# Patient Record
Sex: Female | Born: 1955 | Race: White | Hispanic: No | Smoking: Former smoker
Health system: Southern US, Community
[De-identification: ages and names within clinical notes are randomized; demographics above are authoritative.]

## PROBLEM LIST (undated history)

## (undated) DIAGNOSIS — I639 Cerebral infarction, unspecified: Secondary | ICD-10-CM

## (undated) DIAGNOSIS — I509 Heart failure, unspecified: Secondary | ICD-10-CM

## (undated) DIAGNOSIS — B1920 Unspecified viral hepatitis C without hepatic coma: Secondary | ICD-10-CM

## (undated) DIAGNOSIS — I701 Atherosclerosis of renal artery: Secondary | ICD-10-CM

## (undated) DIAGNOSIS — I4891 Unspecified atrial fibrillation: Secondary | ICD-10-CM

## (undated) DIAGNOSIS — Z720 Tobacco use: Secondary | ICD-10-CM

## (undated) DIAGNOSIS — J449 Chronic obstructive pulmonary disease, unspecified: Secondary | ICD-10-CM

## (undated) DIAGNOSIS — I159 Secondary hypertension, unspecified: Secondary | ICD-10-CM

## (undated) DIAGNOSIS — I2699 Other pulmonary embolism without acute cor pulmonale: Secondary | ICD-10-CM

## (undated) HISTORY — PX: OTHER SURGICAL HISTORY: SHX169

## (undated) HISTORY — PX: BACK SURGERY: SHX140

## (undated) HISTORY — PX: PLACEMENT OF BREAST IMPLANTS: SHX6334

---

## 2001-05-27 ENCOUNTER — Other Ambulatory Visit: Admission: RE | Admit: 2001-05-27 | Discharge: 2001-05-27 | Payer: Self-pay | Admitting: Internal Medicine

## 2002-04-28 ENCOUNTER — Encounter: Payer: Self-pay | Admitting: Emergency Medicine

## 2002-04-28 ENCOUNTER — Emergency Department (HOSPITAL_COMMUNITY): Admission: EM | Admit: 2002-04-28 | Discharge: 2002-04-28 | Payer: Self-pay | Admitting: Emergency Medicine

## 2002-05-01 ENCOUNTER — Ambulatory Visit (HOSPITAL_COMMUNITY): Admission: RE | Admit: 2002-05-01 | Discharge: 2002-05-02 | Payer: Self-pay | Admitting: Neurological Surgery

## 2002-05-01 ENCOUNTER — Encounter: Payer: Self-pay | Admitting: Neurological Surgery

## 2010-07-17 ENCOUNTER — Encounter: Payer: Self-pay | Admitting: Family Medicine

## 2014-05-19 ENCOUNTER — Other Ambulatory Visit: Payer: Self-pay | Admitting: Family Medicine

## 2014-05-19 DIAGNOSIS — I701 Atherosclerosis of renal artery: Secondary | ICD-10-CM

## 2014-05-29 ENCOUNTER — Other Ambulatory Visit: Payer: Self-pay

## 2014-06-03 ENCOUNTER — Inpatient Hospital Stay: Admission: RE | Admit: 2014-06-03 | Payer: Self-pay | Source: Ambulatory Visit

## 2014-06-03 ENCOUNTER — Other Ambulatory Visit: Payer: Self-pay | Admitting: Family Medicine

## 2014-06-03 DIAGNOSIS — I701 Atherosclerosis of renal artery: Secondary | ICD-10-CM

## 2016-05-22 ENCOUNTER — Encounter (HOSPITAL_COMMUNITY): Payer: Self-pay | Admitting: *Deleted

## 2016-05-22 ENCOUNTER — Inpatient Hospital Stay (HOSPITAL_COMMUNITY)
Admission: EM | Admit: 2016-05-22 | Discharge: 2016-05-26 | DRG: 871 | Disposition: A | Discharge status: Home / Self Care | Payer: PRIVATE HEALTH INSURANCE | Attending: Internal Medicine | Admitting: Internal Medicine

## 2016-05-22 ENCOUNTER — Inpatient Hospital Stay (HOSPITAL_COMMUNITY): Payer: PRIVATE HEALTH INSURANCE

## 2016-05-22 ENCOUNTER — Emergency Department (HOSPITAL_COMMUNITY): Payer: PRIVATE HEALTH INSURANCE

## 2016-05-22 DIAGNOSIS — F419 Anxiety disorder, unspecified: Secondary | ICD-10-CM | POA: Diagnosis present

## 2016-05-22 DIAGNOSIS — F17200 Nicotine dependence, unspecified, uncomplicated: Secondary | ICD-10-CM | POA: Diagnosis present

## 2016-05-22 DIAGNOSIS — T380X5A Adverse effect of glucocorticoids and synthetic analogues, initial encounter: Secondary | ICD-10-CM | POA: Diagnosis not present

## 2016-05-22 DIAGNOSIS — J189 Pneumonia, unspecified organism: Secondary | ICD-10-CM

## 2016-05-22 DIAGNOSIS — I509 Heart failure, unspecified: Secondary | ICD-10-CM | POA: Diagnosis not present

## 2016-05-22 DIAGNOSIS — Z885 Allergy status to narcotic agent status: Secondary | ICD-10-CM

## 2016-05-22 DIAGNOSIS — J810 Acute pulmonary edema: Secondary | ICD-10-CM | POA: Diagnosis not present

## 2016-05-22 DIAGNOSIS — I2510 Atherosclerotic heart disease of native coronary artery without angina pectoris: Secondary | ICD-10-CM | POA: Diagnosis not present

## 2016-05-22 DIAGNOSIS — J440 Chronic obstructive pulmonary disease with acute lower respiratory infection: Secondary | ICD-10-CM | POA: Diagnosis present

## 2016-05-22 DIAGNOSIS — R350 Frequency of micturition: Secondary | ICD-10-CM | POA: Diagnosis present

## 2016-05-22 DIAGNOSIS — R748 Abnormal levels of other serum enzymes: Secondary | ICD-10-CM | POA: Diagnosis not present

## 2016-05-22 DIAGNOSIS — N179 Acute kidney failure, unspecified: Secondary | ICD-10-CM

## 2016-05-22 DIAGNOSIS — R931 Abnormal findings on diagnostic imaging of heart and coronary circulation: Secondary | ICD-10-CM

## 2016-05-22 DIAGNOSIS — J9600 Acute respiratory failure, unspecified whether with hypoxia or hypercapnia: Secondary | ICD-10-CM | POA: Diagnosis present

## 2016-05-22 DIAGNOSIS — J9601 Acute respiratory failure with hypoxia: Secondary | ICD-10-CM | POA: Diagnosis not present

## 2016-05-22 DIAGNOSIS — Z888 Allergy status to other drugs, medicaments and biological substances status: Secondary | ICD-10-CM

## 2016-05-22 DIAGNOSIS — I159 Secondary hypertension, unspecified: Secondary | ICD-10-CM | POA: Diagnosis present

## 2016-05-22 DIAGNOSIS — R6520 Severe sepsis without septic shock: Secondary | ICD-10-CM | POA: Diagnosis present

## 2016-05-22 DIAGNOSIS — A419 Sepsis, unspecified organism: Principal | ICD-10-CM

## 2016-05-22 DIAGNOSIS — R410 Disorientation, unspecified: Secondary | ICD-10-CM | POA: Diagnosis present

## 2016-05-22 DIAGNOSIS — R739 Hyperglycemia, unspecified: Secondary | ICD-10-CM | POA: Diagnosis present

## 2016-05-22 DIAGNOSIS — Z23 Encounter for immunization: Secondary | ICD-10-CM | POA: Diagnosis not present

## 2016-05-22 DIAGNOSIS — R7989 Other specified abnormal findings of blood chemistry: Secondary | ICD-10-CM | POA: Diagnosis present

## 2016-05-22 DIAGNOSIS — J449 Chronic obstructive pulmonary disease, unspecified: Secondary | ICD-10-CM

## 2016-05-22 DIAGNOSIS — I701 Atherosclerosis of renal artery: Secondary | ICD-10-CM | POA: Diagnosis present

## 2016-05-22 DIAGNOSIS — R0902 Hypoxemia: Secondary | ICD-10-CM

## 2016-05-22 DIAGNOSIS — R32 Unspecified urinary incontinence: Secondary | ICD-10-CM | POA: Diagnosis present

## 2016-05-22 DIAGNOSIS — R9431 Abnormal electrocardiogram [ECG] [EKG]: Secondary | ICD-10-CM | POA: Diagnosis present

## 2016-05-22 DIAGNOSIS — Z791 Long term (current) use of non-steroidal anti-inflammatories (NSAID): Secondary | ICD-10-CM

## 2016-05-22 DIAGNOSIS — J9690 Respiratory failure, unspecified, unspecified whether with hypoxia or hypercapnia: Secondary | ICD-10-CM

## 2016-05-22 DIAGNOSIS — E873 Alkalosis: Secondary | ICD-10-CM | POA: Diagnosis not present

## 2016-05-22 DIAGNOSIS — J441 Chronic obstructive pulmonary disease with (acute) exacerbation: Secondary | ICD-10-CM | POA: Diagnosis present

## 2016-05-22 DIAGNOSIS — I248 Other forms of acute ischemic heart disease: Secondary | ICD-10-CM | POA: Diagnosis present

## 2016-05-22 DIAGNOSIS — R778 Other specified abnormalities of plasma proteins: Secondary | ICD-10-CM

## 2016-05-22 DIAGNOSIS — Z91013 Allergy to seafood: Secondary | ICD-10-CM

## 2016-05-22 HISTORY — DX: Tobacco use: Z72.0

## 2016-05-22 HISTORY — DX: Atherosclerosis of renal artery: I70.1

## 2016-05-22 HISTORY — DX: Secondary hypertension, unspecified: I15.9

## 2016-05-22 LAB — CBC WITH DIFFERENTIAL/PLATELET
BASOS ABS: 0 10*3/uL (ref 0.0–0.1)
BASOS PCT: 0 %
Basophils Absolute: 0 10*3/uL (ref 0.0–0.1)
Basophils Relative: 0 %
EOS PCT: 2 %
Eosinophils Absolute: 0.3 10*3/uL (ref 0.0–0.7)
Eosinophils Absolute: 0 10*3/uL (ref 0.0–0.7)
Eosinophils Relative: 0 %
HCT: 50.7 % — ABNORMAL HIGH (ref 36.0–46.0)
HEMATOCRIT: 42 % (ref 36.0–46.0)
HEMOGLOBIN: 14.2 g/dL (ref 12.0–15.0)
Hemoglobin: 17.3 g/dL — ABNORMAL HIGH (ref 12.0–15.0)
LYMPHS ABS: 7.1 10*3/uL — AB (ref 0.7–4.0)
LYMPHS PCT: 9 %
Lymphocytes Relative: 43 %
Lymphs Abs: 0.8 10*3/uL (ref 0.7–4.0)
MCH: 30.7 pg (ref 26.0–34.0)
MCH: 29.8 pg (ref 26.0–34.0)
MCHC: 34.1 g/dL (ref 30.0–36.0)
MCHC: 33.8 g/dL (ref 30.0–36.0)
MCV: 89.9 fL (ref 78.0–100.0)
MCV: 88.1 fL (ref 78.0–100.0)
MONO ABS: 1 10*3/uL (ref 0.1–1.0)
MONO ABS: 0.1 10*3/uL (ref 0.1–1.0)
Monocytes Relative: 6 %
Monocytes Relative: 1 %
NEUTROS ABS: 8.2 10*3/uL — AB (ref 1.7–7.7)
NEUTROS ABS: 8.3 10*3/uL — AB (ref 1.7–7.7)
NEUTROS PCT: 90 %
Neutrophils Relative %: 49 %
PLATELETS: 282 10*3/uL (ref 150–400)
Platelets: 232 10*3/uL (ref 150–400)
RBC: 5.64 MIL/uL — AB (ref 3.87–5.11)
RBC: 4.77 MIL/uL (ref 3.87–5.11)
RDW: 12.9 % (ref 11.5–15.5)
RDW: 12.9 % (ref 11.5–15.5)
WBC: 16.6 10*3/uL — AB (ref 4.0–10.5)
WBC: 9.2 10*3/uL (ref 4.0–10.5)

## 2016-05-22 LAB — PROTIME-INR
INR: 1.06
Prothrombin Time: 13.8 seconds (ref 11.4–15.2)

## 2016-05-22 LAB — LACTIC ACID, PLASMA
LACTIC ACID, VENOUS: 3.7 mmol/L — AB (ref 0.5–1.9)
LACTIC ACID, VENOUS: 5.3 mmol/L — AB (ref 0.5–1.9)
LACTIC ACID, VENOUS: 5 mmol/L — AB (ref 0.5–1.9)
LACTIC ACID, VENOUS: 2.6 mmol/L — AB (ref 0.5–1.9)
LACTIC ACID, VENOUS: 1.3 mmol/L (ref 0.5–1.9)
Lactic Acid, Venous: 2.5 mmol/L (ref 0.5–1.9)

## 2016-05-22 LAB — I-STAT TROPONIN, ED
Troponin i, poc: 0.07 ng/mL (ref 0.00–0.08)
Troponin i, poc: 0.24 ng/mL (ref 0.00–0.08)

## 2016-05-22 LAB — I-STAT ARTERIAL BLOOD GAS, ED
Acid-base deficit: 8 mmol/L — ABNORMAL HIGH (ref 0.0–2.0)
Acid-base deficit: 8 mmol/L — ABNORMAL HIGH (ref 0.0–2.0)
BICARBONATE: 18.3 mmol/L — AB (ref 20.0–28.0)
Bicarbonate: 14.9 mmol/L — ABNORMAL LOW (ref 20.0–28.0)
O2 SAT: 95 %
O2 Saturation: 92 %
PCO2 ART: 38.4 mmHg (ref 32.0–48.0)
PO2 ART: 86 mmHg (ref 83.0–108.0)
Patient temperature: 98.6
TCO2: 19 mmol/L (ref 0–100)
TCO2: 16 mmol/L (ref 0–100)
pCO2 arterial: 24.3 mmHg — ABNORMAL LOW (ref 32.0–48.0)
pH, Arterial: 7.285 — ABNORMAL LOW (ref 7.350–7.450)
pH, Arterial: 7.395 (ref 7.350–7.450)
pO2, Arterial: 61 mmHg — ABNORMAL LOW (ref 83.0–108.0)

## 2016-05-22 LAB — PROCALCITONIN
Procalcitonin: 0.18 ng/mL
Procalcitonin: 0.36 ng/mL
Procalcitonin: 0.37 ng/mL

## 2016-05-22 LAB — COMPREHENSIVE METABOLIC PANEL
ALBUMIN: 3.3 g/dL — AB (ref 3.5–5.0)
ALK PHOS: 82 U/L (ref 38–126)
ALT: 20 U/L (ref 14–54)
AST: 36 U/L (ref 15–41)
Anion gap: 12 (ref 5–15)
BILIRUBIN TOTAL: 0.6 mg/dL (ref 0.3–1.2)
BUN: 14 mg/dL (ref 6–20)
CALCIUM: 8.5 mg/dL — AB (ref 8.9–10.3)
CO2: 18 mmol/L — ABNORMAL LOW (ref 22–32)
CREATININE: 1.24 mg/dL — AB (ref 0.44–1.00)
Chloride: 107 mmol/L (ref 101–111)
GFR calc Af Amer: 53 mL/min — ABNORMAL LOW (ref >60)
GFR, EST NON AFRICAN AMERICAN: 46 mL/min — AB (ref >60)
GLUCOSE: 184 mg/dL — AB (ref 65–99)
Potassium: 3 mmol/L — ABNORMAL LOW (ref 3.5–5.1)
Sodium: 137 mmol/L (ref 135–145)
TOTAL PROTEIN: 6.8 g/dL (ref 6.5–8.1)

## 2016-05-22 LAB — I-STAT CG4 LACTIC ACID, ED
LACTIC ACID, VENOUS: 3.47 mmol/L — AB (ref 0.5–1.9)
Lactic Acid, Venous: 2.52 mmol/L (ref 0.5–1.9)

## 2016-05-22 LAB — GLUCOSE, CAPILLARY
GLUCOSE-CAPILLARY: 125 mg/dL — AB (ref 65–99)
Glucose-Capillary: 125 mg/dL — ABNORMAL HIGH (ref 65–99)

## 2016-05-22 LAB — BASIC METABOLIC PANEL
ANION GAP: 12 (ref 5–15)
BUN: 15 mg/dL (ref 6–20)
CALCIUM: 9.3 mg/dL (ref 8.9–10.3)
CO2: 16 mmol/L — AB (ref 22–32)
CREATININE: 1.37 mg/dL — AB (ref 0.44–1.00)
Chloride: 108 mmol/L (ref 101–111)
GFR calc Af Amer: 47 mL/min — ABNORMAL LOW (ref >60)
GFR, EST NON AFRICAN AMERICAN: 41 mL/min — AB (ref >60)
GLUCOSE: 236 mg/dL — AB (ref 65–99)
Potassium: 3.8 mmol/L (ref 3.5–5.1)
Sodium: 136 mmol/L (ref 135–145)

## 2016-05-22 LAB — TROPONIN I
TROPONIN I: 0.33 ng/mL — AB (ref <0.03)
Troponin I: 0.16 ng/mL (ref <0.03)
Troponin I: 0.18 ng/mL (ref <0.03)
Troponin I: 0.22 ng/mL (ref <0.03)

## 2016-05-22 LAB — MRSA PCR SCREENING: MRSA by PCR: NEGATIVE

## 2016-05-22 LAB — ACETAMINOPHEN LEVEL: Acetaminophen (Tylenol), Serum: <10 ug/mL — ABNORMAL LOW (ref 10–30)

## 2016-05-22 LAB — SALICYLATE LEVEL

## 2016-05-22 LAB — SEDIMENTATION RATE: SED RATE: 2 mm/h (ref 0–22)

## 2016-05-22 LAB — INFLUENZA PANEL BY PCR (TYPE A & B)
INFLAPCR: NEGATIVE
INFLBPCR: NEGATIVE

## 2016-05-22 LAB — BRAIN NATRIURETIC PEPTIDE: B Natriuretic Peptide: 633.4 pg/mL — ABNORMAL HIGH (ref 0.0–100.0)

## 2016-05-22 LAB — TSH: TSH: 1.694 u[IU]/mL (ref 0.350–4.500)

## 2016-05-22 LAB — APTT: aPTT: 27 seconds (ref 24–36)

## 2016-05-22 MED ORDER — SODIUM CHLORIDE 0.9 % IV BOLUS (SEPSIS)
1000.0000 mL | Freq: Once | INTRAVENOUS | Status: AC
  Administered 2016-05-22: 1000 mL via INTRAVENOUS

## 2016-05-22 MED ORDER — IPRATROPIUM-ALBUTEROL 0.5-2.5 (3) MG/3ML IN SOLN
RESPIRATORY_TRACT | Status: AC
  Administered 2016-05-22: 17:00:00
  Filled 2016-05-22: qty 3

## 2016-05-22 MED ORDER — SODIUM CHLORIDE 0.9 % IV SOLN
Freq: Once | INTRAVENOUS | Status: AC
  Administered 2016-05-22: 11:00:00 via INTRAVENOUS

## 2016-05-22 MED ORDER — SODIUM CHLORIDE 0.9 % IV SOLN
INTRAVENOUS | Status: DC
  Administered 2016-05-22 – 2016-05-23 (×2): via INTRAVENOUS

## 2016-05-22 MED ORDER — LEVOFLOXACIN IN D5W 750 MG/150ML IV SOLN
750.0000 mg | INTRAVENOUS | Status: DC
  Administered 2016-05-22: 750 mg via INTRAVENOUS
  Filled 2016-05-22: qty 150

## 2016-05-22 MED ORDER — METHYLPREDNISOLONE SODIUM SUCC 125 MG IJ SOLR
60.0000 mg | Freq: Four times a day (QID) | INTRAMUSCULAR | Status: DC
  Administered 2016-05-22 – 2016-05-23 (×4): 60 mg via INTRAVENOUS
  Filled 2016-05-22 (×5): qty 2

## 2016-05-22 MED ORDER — SODIUM CHLORIDE 0.9% FLUSH
3.0000 mL | Freq: Two times a day (BID) | INTRAVENOUS | Status: DC
  Administered 2016-05-22 – 2016-05-25 (×6): 3 mL via INTRAVENOUS

## 2016-05-22 MED ORDER — DEXTROSE 5 % IV SOLN
1.0000 g | INTRAVENOUS | Status: DC
  Administered 2016-05-22 – 2016-05-25 (×4): 1 g via INTRAVENOUS
  Filled 2016-05-22 (×5): qty 10

## 2016-05-22 MED ORDER — AZITHROMYCIN 500 MG IV SOLR
500.0000 mg | INTRAVENOUS | Status: DC
  Administered 2016-05-23 – 2016-05-25 (×3): 500 mg via INTRAVENOUS
  Filled 2016-05-22 (×4): qty 500

## 2016-05-22 MED ORDER — ASPIRIN 325 MG PO TABS
325.0000 mg | ORAL_TABLET | Freq: Once | ORAL | Status: DC
  Filled 2016-05-22: qty 1

## 2016-05-22 MED ORDER — OSELTAMIVIR PHOSPHATE 30 MG PO CAPS
30.0000 mg | ORAL_CAPSULE | Freq: Two times a day (BID) | ORAL | Status: DC
  Administered 2016-05-23: 30 mg via ORAL
  Filled 2016-05-22 (×2): qty 1

## 2016-05-22 MED ORDER — ACETAMINOPHEN 325 MG PO TABS
650.0000 mg | ORAL_TABLET | ORAL | Status: DC | PRN
  Administered 2016-05-22: 650 mg via ORAL
  Filled 2016-05-22: qty 2

## 2016-05-22 MED ORDER — GUAIFENESIN ER 600 MG PO TB12
1200.0000 mg | ORAL_TABLET | Freq: Two times a day (BID) | ORAL | Status: DC
  Administered 2016-05-23 – 2016-05-26 (×7): 1200 mg via ORAL
  Filled 2016-05-22 (×9): qty 2

## 2016-05-22 MED ORDER — ASPIRIN 325 MG PO TABS
325.0000 mg | ORAL_TABLET | Freq: Every day | ORAL | Status: DC
  Administered 2016-05-23 – 2016-05-26 (×4): 325 mg via ORAL
  Filled 2016-05-22 (×4): qty 1

## 2016-05-22 MED ORDER — SODIUM CHLORIDE 0.9 % IV SOLN: 250.0000 mL | INTRAVENOUS | Status: DC | PRN

## 2016-05-22 MED ORDER — FUROSEMIDE 10 MG/ML IJ SOLN
40.0000 mg | Freq: Once | INTRAMUSCULAR | Status: DC
  Filled 2016-05-22: qty 4

## 2016-05-22 MED ORDER — SODIUM CHLORIDE 0.9% FLUSH
3.0000 mL | Freq: Two times a day (BID) | INTRAVENOUS | Status: DC
  Administered 2016-05-22 – 2016-05-26 (×5): 3 mL via INTRAVENOUS

## 2016-05-22 MED ORDER — ACETAMINOPHEN 650 MG RE SUPP: 650.0000 mg | Freq: Four times a day (QID) | RECTAL | Status: DC | PRN

## 2016-05-22 MED ORDER — ENOXAPARIN SODIUM 40 MG/0.4ML ~~LOC~~ SOLN
40.0000 mg | Freq: Every day | SUBCUTANEOUS | Status: DC
  Administered 2016-05-23 – 2016-05-24 (×2): 40 mg via SUBCUTANEOUS
  Filled 2016-05-22 (×4): qty 0.4

## 2016-05-22 MED ORDER — ACETAMINOPHEN 325 MG PO TABS
650.0000 mg | ORAL_TABLET | Freq: Four times a day (QID) | ORAL | Status: DC | PRN
  Filled 2016-05-22: qty 2

## 2016-05-22 MED ORDER — LORAZEPAM 2 MG/ML IJ SOLN
0.5000 mg | INTRAMUSCULAR | Status: AC
  Administered 2016-05-22: 0.5 mg via INTRAVENOUS
  Filled 2016-05-22: qty 1

## 2016-05-22 MED ORDER — INSULIN ASPART 100 UNIT/ML ~~LOC~~ SOLN
0.0000 [IU] | SUBCUTANEOUS | Status: DC
Start: 2016-05-22 — End: 2016-05-23
  Administered 2016-05-22 – 2016-05-23 (×4): 1 [IU] via SUBCUTANEOUS
  Administered 2016-05-23: 3 [IU] via SUBCUTANEOUS

## 2016-05-22 MED ORDER — ONDANSETRON HCL 4 MG/2ML IJ SOLN: 4.0000 mg | Freq: Four times a day (QID) | INTRAMUSCULAR | Status: DC | PRN

## 2016-05-22 MED ORDER — KETOROLAC TROMETHAMINE 30 MG/ML IJ SOLN
30.0000 mg | Freq: Four times a day (QID) | INTRAMUSCULAR | Status: DC | PRN
Start: 2016-05-22 — End: 2016-05-24

## 2016-05-22 MED ORDER — ALBUTEROL (5 MG/ML) CONTINUOUS INHALATION SOLN
10.0000 mg/h | INHALATION_SOLUTION | Freq: Once | RESPIRATORY_TRACT | Status: AC
  Administered 2016-05-22: 10 mg/h via RESPIRATORY_TRACT
  Filled 2016-05-22: qty 20

## 2016-05-22 MED ORDER — HEPARIN SODIUM (PORCINE) 5000 UNIT/ML IJ SOLN: 5000.0000 [IU] | Freq: Three times a day (TID) | INTRAMUSCULAR | Status: DC

## 2016-05-22 MED ORDER — ONDANSETRON HCL 4 MG PO TABS: 4.0000 mg | ORAL_TABLET | Freq: Four times a day (QID) | ORAL | Status: DC | PRN

## 2016-05-22 MED ORDER — LEVALBUTEROL HCL 1.25 MG/0.5ML IN NEBU
1.2500 mg | INHALATION_SOLUTION | Freq: Four times a day (QID) | RESPIRATORY_TRACT | Status: DC
  Filled 2016-05-22: qty 0.5

## 2016-05-22 MED ORDER — IBUPROFEN 600 MG PO TABS
600.0000 mg | ORAL_TABLET | ORAL | Status: DC | PRN
  Administered 2016-05-22 – 2016-05-23 (×3): 600 mg via ORAL
  Filled 2016-05-22 (×3): qty 3

## 2016-05-22 MED ORDER — LEVALBUTEROL HCL 0.63 MG/3ML IN NEBU: 0.6300 mg | INHALATION_SOLUTION | RESPIRATORY_TRACT | Status: DC | PRN

## 2016-05-22 MED ORDER — PREDNISONE 20 MG PO TABS: 60.0000 mg | ORAL_TABLET | Freq: Every day | ORAL | Status: DC

## 2016-05-22 MED ORDER — VANCOMYCIN HCL 500 MG IV SOLR
500.0000 mg | Freq: Two times a day (BID) | INTRAVENOUS | Status: DC
  Administered 2016-05-22: 500 mg via INTRAVENOUS
  Filled 2016-05-22 (×3): qty 500

## 2016-05-22 MED ORDER — PIPERACILLIN-TAZOBACTAM 3.375 G IVPB
3.3750 g | Freq: Three times a day (TID) | INTRAVENOUS | Status: DC
Start: 2016-05-22 — End: 2016-05-22
  Administered 2016-05-22: 3.375 g via INTRAVENOUS
  Filled 2016-05-22: qty 50

## 2016-05-22 MED ORDER — IBUPROFEN 800 MG PO TABS
800.0000 mg | ORAL_TABLET | Freq: Once | ORAL | Status: AC
  Administered 2016-05-22: 800 mg via ORAL
  Filled 2016-05-22: qty 1

## 2016-05-22 MED ORDER — METOPROLOL TARTRATE 5 MG/5ML IV SOLN
5.0000 mg | INTRAVENOUS | Status: DC | PRN
  Filled 2016-05-22: qty 5

## 2016-05-22 MED ORDER — SODIUM CHLORIDE 0.45 % IV SOLN: INTRAVENOUS | Status: DC

## 2016-05-22 MED ORDER — NICOTINE 14 MG/24HR TD PT24
14.0000 mg | MEDICATED_PATCH | Freq: Every day | TRANSDERMAL | Status: DC
  Administered 2016-05-23 – 2016-05-26 (×4): 14 mg via TRANSDERMAL
  Filled 2016-05-22 (×4): qty 1

## 2016-05-22 MED ORDER — SODIUM CHLORIDE 0.9% FLUSH: 3.0000 mL | INTRAVENOUS | Status: DC | PRN

## 2016-05-22 MED ORDER — IOPAMIDOL (ISOVUE-370) INJECTION 76%
INTRAVENOUS | Status: AC
  Filled 2016-05-22: qty 100

## 2016-05-22 MED ORDER — IPRATROPIUM-ALBUTEROL 0.5-2.5 (3) MG/3ML IN SOLN
3.0000 mL | Freq: Four times a day (QID) | RESPIRATORY_TRACT | Status: DC
Start: 2016-05-22 — End: 2016-05-23
  Administered 2016-05-22 – 2016-05-23 (×4): 3 mL via RESPIRATORY_TRACT
  Filled 2016-05-22 (×3): qty 3

## 2016-05-22 NOTE — ED Notes (Signed)
Pt refused to take aspirin or any more oral medications until she is placed in an inpatient room. Informed patient that her troponin level was elevated and the aspirin was important, however pt continued to refuse.

## 2016-05-22 NOTE — ED Notes (Signed)
Lab called critical lab lactic acid 5.0 reported to admit Doctor. As well as patient removing bipap and refusing to put bipap on until tylenol is given for neck pain. Currently 8/10 achy sore. Tylenol given and still refused bipap. Did allow [place] 3 liters to be placed.

## 2016-05-22 NOTE — ED Notes (Signed)
Offered to put patient on O2 due to blood gas O2 level was lower, pt refused.

## 2016-05-22 NOTE — ED Notes (Signed)
CRITICAL VALUE ALERT  Critical value received:  Lactic Acid 5.3  Date of notification:  05/22/2016  Time of notification: 0952  Critical value read back:Yes  Nurse who received alert:  Izora Gala  MD notified (1st page):  [phone]  Responding MD: Dr. Marily Memos  Time MD responded:  [phone]  Face to face encounter.

## 2016-05-22 NOTE — ED Triage Notes (Signed)
The pt arrived by gems from home  Sudden onset sob no known illness    Ems gave 10 mg of albuterol solumedrol mag sulfate enroute  Here  Placed on bi-pap when arrived in the ed

## 2016-05-22 NOTE — Progress Notes (Signed)
Pharmacy Antibiotic Note  Glenda Johnson is a 60 y.o. female admitted on 05/22/2016 with sepsis.  Pharmacy has been consulted for Vancomycin/Zosyn dosing. WBC elevated, noted bump in Scr, CXR with some infiltrates>>likely source.   Plan: -Vancomycin 500 mg IV q12h -Zosyn 3.375G IV q8h to be infused over 4 hours -Trend WBC, temp, renal function  -Drug levels as indicated   Height: 5\' 6"  (167.6 cm) Weight: 130 lb (59 kg) IBW/kg (Calculated) : 59.3  No data recorded.   Recent Labs Lab 05/22/16 0152 05/22/16 0233  WBC 16.6*  --   CREATININE 1.37*  --   LATICACIDVEN  --  2.52*    Estimated Creatinine Clearance: 40.2 mL/min (by C-G formula based on SCr of 1.37 mg/dL (H)).    Allergies  Allergen Reactions  . Codeine     Narda Bonds 05/22/2016 2:39 AM

## 2016-05-22 NOTE — ED Notes (Signed)
Dr. Marily Memos in to talk with pt and family ref. Update on pt's condition

## 2016-05-22 NOTE — ED Notes (Signed)
Critical care at bedside  

## 2016-05-22 NOTE — ED Notes (Signed)
Pt transported to CT ?

## 2016-05-22 NOTE — ED Notes (Signed)
Admit Doctor at bedside.  

## 2016-05-22 NOTE — ED Notes (Signed)
Spoke with Lab. Epic cancelling order. Hand written order and Admit Doctor signed sent to lab via tube station.

## 2016-05-22 NOTE — ED Notes (Signed)
CRITICAL VALUE ALERT  Critical value received:  Lactic Acid 3.7 & Troponin 0.16  Date of notification:  05/22/2016  Time of notification:  0707  Critical value read back:Yes  Nurse who received alert:  Izora Gala  MD notified (1st page):  0730  Responding MD:  Dr. Marily Memos  Time MD responded:  0730  Face to Face encounter.

## 2016-05-22 NOTE — ED Notes (Signed)
Patient states still feels short of breath and hot. Temperature in room continue to be on lowest setting. Continues to wear oxygen 93% 3L [place] refuses to wear blood pressure cuff.

## 2016-05-22 NOTE — ED Notes (Signed)
Admitting MD at bedside to speak with patient.  

## 2016-05-22 NOTE — ED Notes (Signed)
Patient returned to room short of breath and productive cough yellow blood tinge new for patient stated by patient. Pulse oximetry 93% room air upon returned. Nurse asked if he can apply oxygen via [place] to help with shortness of breath. Patient agreed. Placed with 3L [place].

## 2016-05-22 NOTE — Progress Notes (Signed)
Pharmacy Antibiotic Note  Glenda Johnson is a 60 y.o. female admitted on 05/22/2016 with sepsis.  WBC elevated, noted bump in Scr, CXR with some infiltrates>>likely source.   Plan: DC vanc and zosyn Levoflox 750 mg q48h  Height: 5\' 6"  (167.6 cm) Weight: 130 lb (59 kg) IBW/kg (Calculated) : 59.3  Temp (24hrs), Avg:97.6 F (36.4 C), Min:97.6 F (36.4 C), Max:97.6 F (36.4 C)   Recent Labs Lab 05/22/16 0152 05/22/16 0233 05/22/16 0524 05/22/16 0603  WBC 16.6*  --   --   --   CREATININE 1.37*  --   --   --   LATICACIDVEN  --  2.52* 3.47* 3.7*    Estimated Creatinine Clearance: 40.2 mL/min (by C-G formula based on SCr of 1.37 mg/dL (H)).    Allergies  Allergen Reactions  . Codeine Anaphylaxis  . Citalopram Other (See Comments)    Caused more depression   . Fish Allergy Itching    ears  . Wellbutrin [Bupropion] Other (See Comments)    "could not speak a full sentence for a whole year"   Levester Fresh, PharmD, BCPS, [facility] Clinical Pharmacist Phone [phone] 05/22/2016 9:46 AM

## 2016-05-22 NOTE — ED Notes (Signed)
Patient and cousin assisted patient to bathroom with wheelchair without waiting for ED staff.

## 2016-05-22 NOTE — H&P (Addendum)
PULMONARY / CRITICAL CARE MEDICINE   Name: Glenda Johnson MRN: [id number] DOB: [birth year]    ADMISSION DATE:  05/22/2016 CONSULTATION DATE:  05/22/16  REFERRING MD:  Dr. 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  Symptoms began suddenly last night. She reports being well before that. She has an exposure at work to a doctor who is now admitted in [place] with pneumonia and sepsis. She notes an episode of teaspoon of hemoptysis about 5 days ago and then again today. She has symptoms of acute dyspnea, cough, white sputum production, subjective fevers, chills. Denies any recent travel. Denies chest pain, palpitation.  Patient given albuterol, 2 mg back, one 25 mg of Medrol and Atrovent nebs. Labs significant for elevated WBC count, lactic acid, BNP, troponin and creatinine. She has been given Vanco, Zosyn, levaquin. Initially she was given 40 mg of Lasix and then 3 L of IV fluid. She was placed on BiPAP but she is uncomfortable with it and keeps taking it off. PCCM called for evaluation as her respiratory status continues to deteriorate with worsening chest x-ray.  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 She is 35-pack-year smoking history and continues to smoke up to a pack a day. She routinely avoids medical treatment and has not had any regular screening test.  PAST MEDICAL HISTORY :  She  has a past medical history of Renal artery stenosis ([place]); Secondary hypertension; and Tobacco abuse.  PAST SURGICAL HISTORY: She  has a past surgical history that includes Back surgery and stent renal artery.  Allergies  Allergen Reactions  . Codeine Anaphylaxis  . Citalopram Other (See Comments)    Caused more depression   . Fish Allergy Itching    ears  . Wellbutrin  [Bupropion] Other (See Comments)    "could not speak a full sentence for a whole year"    No current facility-administered medications on file prior to encounter.    No current outpatient prescriptions on file prior to encounter.    FAMILY HISTORY:  Her has no family status information on file.    SOCIAL HISTORY: She  reports that she has been smoking.  She has never used smokeless tobacco. She reports that she does not drink alcohol or use drugs.  REVIEW OF SYSTEMS:   As noted above acute dyspnea, cough, sputum production, wheezing. Positive for urinary frequency, urinary incontinence Positive for weight loss over 3-4 months. Denies any chest pain, palpitation. Positive for subjective fevers, chills.  SUBJECTIVE:   VITAL SIGNS: BP (!) 154/105   Pulse 112   Temp 97.6 F (36.4 C) (Rectal)   Resp (!) 33   Ht 5\' 6"  (1.676 m)   Wt 130 lb (59 kg)   SpO2 95%   BMI 20.98 kg/m   HEMODYNAMICS:    VENTILATOR SETTINGS:    INTAKE / OUTPUT: No intake/output data recorded.  PHYSICAL EXAMINATION: General:  Awake, oriented, mild distress Neuro:  Moves all 4 extremities, no focal deficits HEENT:  Moist mucous membranes, no JVD noted. No thyromegaly Cardiovascular:  Tachycardia, regular rate, no murmurs rubs gallops Lungs:  Bilateral coarse rhonchi, occasional wheeze.  Abdomen: Soft, Distended, + BS Musculoskeletal:  Normal tone and bulk, no edema  Skin: Intact.  LABS:  BMET  Recent Labs Lab 05/22/16 0152 05/22/16 0959  NA 136 137  K 3.8 3.0*  CL 108 107  CO2 16* 18*  BUN 15 14  CREATININE 1.37* 1.24*  GLUCOSE 236* 184*    Electrolytes  Recent Labs Lab 05/22/16 0152 05/22/16 0959  CALCIUM 9.3 8.5*    CBC  Recent Labs Lab 05/22/16 0152 05/22/16 0959  WBC 16.6* 9.2  HGB 17.3* 14.2  HCT 50.7* 42.0  PLT 282 232    Coag's  Recent Labs Lab 05/22/16 0603  APTT 27  INR 1.06    Sepsis Markers  Recent Labs Lab 05/22/16 0603 05/22/16 0841  05/22/16 0959 05/22/16 1305  LATICACIDVEN 3.7* 5.3*  --  5.0*  PROCALCITON 0.18  --  0.36  --     ABG  Recent Labs Lab 05/22/16 0202 05/22/16 0803  PHART 7.285* 7.395  PCO2ART 38.4 24.3*  PO2ART 86.0 61.0*    Liver Enzymes  Recent Labs Lab 05/22/16 0959  AST 36  ALT 20  ALKPHOS 82  BILITOT 0.6  ALBUMIN 3.3*    Cardiac Enzymes  Recent Labs Lab 05/22/16 0603 05/22/16 0959 05/22/16 1300  TROPONINI 0.16* 0.18* 0.22*    Glucose No results for input(s): GLUCAP in the last 168 hours.  Imaging Dg Chest Port 1 View  Result Date: 05/22/2016 CLINICAL DATA:  Respiratory failure EXAM: PORTABLE CHEST 1 VIEW COMPARISON:  05/22/2016 10 o'clock hour FINDINGS: Diffuse airspace disease is stable in a pulmonary edema pattern. Normal heart size. No pneumothorax or pleural effusion. IMPRESSION: Worsening diffuse airspace disease. Differential diagnosis primarily includes pulmonary edema, ARDS, or bilateral pneumonia. Electronically Signed   By: Marybelle Killings M.D.   On: 05/22/2016 13:11   Dg Chest Port 1 View  Result Date: 05/22/2016 CLINICAL DATA:  Worsening shortness of breath beginning yesterday. Sepsis. Acute respiratory failure with hypoxia. EXAM: PORTABLE CHEST 1 VIEW COMPARISON:  05/22/2016 FINDINGS: The heart size and mediastinal contours are within normal limits. Bilateral pulmonary airspace disease shows significant improvement since previous study. No evidence of pneumothorax or pleural effusion. IMPRESSION: Significant interval decrease in bilateral airspace disease. Electronically Signed   By: Earle Gell M.D.   On: 05/22/2016 10:27   Dg Chest Portable 1 View  Result Date: 05/22/2016 CLINICAL DATA:  Sudden onset shortness of breath.  Hypoxia. EXAM: PORTABLE CHEST 1 VIEW COMPARISON:  None. FINDINGS: Normal heart size and pulmonary vascularity. Bilateral mid lung and basilar airspace infiltrates could represent edema or consolidated pneumonia. No blunting of costophrenic  angles. No pneumothorax. Mediastinal contours appear intact. Calcification of the aorta. IMPRESSION: Bilateral mid and lower lung airspace infiltrates. Electronically Signed   By: Lucienne Capers M.D.   On: 05/22/2016 02:14     STUDIES:     CULTURES: Bcx X 2 11/27> Sputun cx 11/27> RVP 11/27 >  ANTIBIOTICS: Vanco 11/27 Zosyn 11/27 Levaquin 11/27 Ceftriaxone 11/27 >> Azithromycin 11/27 >>  SIGNIFICANT EVENTS:   LINES/TUBES:   DISCUSSION: 60 year old smoker with severe sepsis, acute respiratory failure, bilateral infiltrates. Rapidity of the onset with low Pct suggests a viral infection and she may be developing ARDS. PE is possible but less likely as she has b/l infiltrates as a cause of hypoxia.  ASSESSMENT / PLAN:  PULMONARY A: COPD Acute hypoxic resp failure ? ARDS P:   Admit to ICU Continue Bipap. Will need close monitoring for intubation needs Start IV solumedrol Standing nebs. CT chest when stable.  CARDIOVASCULAR A:  CHF possible but JVD is not impressive Elevated troponin, BNP in  setting of stress. ? Demand ischemia. P:  Continue gentle fluid resuscitation Check echo Follow troponins and EKG  RENAL A:   AKI Urinary frequency and incontinence P:   Continue IVF. NS at 75cc/hr Monitor urine output and Cr Check UA  GASTROINTESTINAL A:   Stable P:   Keep NPO for now  HEMATOLOGIC A:   Leukocytosis in setting of infection, sepsis P:  Follow CBG  INFECTIOUS A:   Sepsis. Likely source severe CAP R/O Flu P:   Change abx to ceftriaxone, azithromycin Add empiric tamiflu till viral panel is back.  ENDOCRINE A:   Stable P:   SSI while on steroids  NEUROLOGIC A:   Mild confusion P:   Monitor in ICU  FAMILY  - Updates: patient and cousin updated at bedside. She is OK with resuscitation and short term intubation if needed. - Inter-disciplinary family meet or Palliative Care meeting due by:  05/29/16  My critical care time- 35  mins.  Marshell Garfinkel MD [facility] Pager [phone] If no answer or after 3pm call: [phone] 05/22/2016, 3:24 PM

## 2016-05-22 NOTE — ED Notes (Signed)
Spoke with patient regarding shortness of breath. States would like the BiPAP again. Respiratory notified and at bedside.

## 2016-05-22 NOTE — H&P (Signed)
History and Physical    Glenda Johnson [id number] DOB: 08-03-1955 DOA: 05/22/2016  PCP: No primary care provider on file. Patient coming from: home  Chief Complaint: SOB  HPI: Glenda Johnson is a 60 y.o. female with medical history significant of chronic tobacco use and hypertension. Patient presenting with acute onset shortness of breath. Associated with a "gurgling feeling" in her chest, "yawning uncontrollably, "and a feeling of chest congestion. Patient felt that her lungs became very tight and she was unable to remove a significant amount of air. Felt that her breathing was very shallow. Worse with exertion. Improved with rest. Does endorse positional chest pain but denies any palpitations, lower extremity swelling, fevers, nausea, vomiting, abdominal pain, dysuria, frequency, diarrhea. Symptoms came on around 11:30 shortly after taking care of her animals in the barn.   ED Course: Started on vancomycin and Zosyn for presumed sepsis. No fluids given. Addictive findings outlined below.  Review of Systems: As per HPI otherwise 10 point review of systems negative.   Ambulatory Status: No restrictions  Past Medical History:  Diagnosis Date  . Renal artery stenosis ([place])   . Secondary hypertension   . Tobacco abuse     Past Surgical History:  Procedure Laterality Date  . BACK SURGERY    . stent renal artery      Social History   Social History  . Marital status: Legally Separated    Spouse name: N/A  . Number of children: N/A  . Years of education: N/A   Occupational History  . Not on file.   Social History Main Topics  . Smoking status: Current Every Day Smoker  . Smokeless tobacco: Never Used  . Alcohol use No  . Drug use: No  . Sexual activity: Not on file   Other Topics Concern  . Not on file   Social History Narrative  . No narrative on file    Allergies  Allergen Reactions  . Codeine Anaphylaxis  . Citalopram Other (See Comments)   Caused more depression   . Fish Allergy Itching    ears  . Wellbutrin [Bupropion] Other (See Comments)    "could not speak a full sentence for a whole year"    Family History  Problem Relation Age of Onset  . Family history unknown: Yes  Pt states she is not adopted but that there are a lot of "family problems" and she doesn't know what anyones health problems are.   Prior to Admission medications   Medication Sig Start Date End Date Taking? Authorizing Provider  ibuprofen (ADVIL,MOTRIN) 200 MG tablet Take 600-800 mg by mouth every 6 (six) hours as needed for moderate pain.   Yes Historical Provider, MD    Physical Exam: Vitals:   05/22/16 0530 05/22/16 0600 05/22/16 0827 05/22/16 0926  BP: (!) 109/54 125/64 133/84 141/83  Pulse:   114 98  Resp: 21 18 24 23   Temp:      TempSrc:      SpO2:   96% 95%  Weight:      Height:         General:  Appears anxious, wresting in bed Eyes:  PERRL, EOMI, normal lids, iris ENT:  grossly normal hearing, lips & tongue, mmm Neck:  no LAD, masses or thyromegaly Cardiovascular:  RRR, no m/r/g. No LE edema.  Respiratory: Decreased breath sounds throughout, intermittent rhonchi and wheezes, increased effort Abdomen:  soft, ntnd, NABS Skin:  no rash or induration seen on limited exam Musculoskeletal:  grossly normal tone BUE/BLE, good ROM, no bony abnormality Psychiatric:  grossly normal mood and affect, speech fluent and appropriate, AOx3 Neurologic:  CN 2-12 grossly intact, moves all extremities in coordinated fashion, sensation intact  Labs on Admission: I have personally reviewed following labs and imaging studies  CBC:  Recent Labs Lab 05/22/16 0152  WBC 16.6*  NEUTROABS 8.2*  HGB 17.3*  HCT 50.7*  MCV 89.9  PLT [id number]   Basic Metabolic Panel:  Recent Labs Lab 05/22/16 0152  NA 136  K 3.8  CL 108  CO2 16*  GLUCOSE 236*  BUN 15  CREATININE 1.37*  CALCIUM 9.3   GFR: Estimated Creatinine Clearance: 40.2 mL/min (by C-G  formula based on SCr of 1.37 mg/dL (H)). Liver Function Tests: No results for input(s): AST, ALT, ALKPHOS, BILITOT, PROT, ALBUMIN in the last 168 hours. No results for input(s): LIPASE, AMYLASE in the last 168 hours. No results for input(s): AMMONIA in the last 168 hours. Coagulation Profile:  Recent Labs Lab 05/22/16 0603  INR 1.06   Cardiac Enzymes:  Recent Labs Lab 05/22/16 0603  TROPONINI 0.16*   BNP (last 3 results) No results for input(s): PROBNP in the last 8760 hours. HbA1C: No results for input(s): HGBA1C in the last 72 hours. CBG: No results for input(s): GLUCAP in the last 168 hours. Lipid Profile: No results for input(s): CHOL, HDL, LDLCALC, TRIG, CHOLHDL, LDLDIRECT in the last 72 hours. Thyroid Function Tests:  Recent Labs  05/22/16 0603  TSH 1.694   Anemia Panel: No results for input(s): VITAMINB12, FOLATE, FERRITIN, TIBC, IRON, RETICCTPCT in the last 72 hours. Urine analysis: No results found for: COLORURINE, APPEARANCEUR, LABSPEC, PHURINE, GLUCOSEU, HGBUR, BILIRUBINUR, KETONESUR, PROTEINUR, UROBILINOGEN, NITRITE, LEUKOCYTESUR  Creatinine Clearance: Estimated Creatinine Clearance: 40.2 mL/min (by C-G formula based on SCr of 1.37 mg/dL (H)).  Sepsis Labs: @LABRCNTIP (procalcitonin:4,lacticidven:4) )No results found for this or any previous visit (from the past 240 hour(s)).   Radiological Exams on Admission: Dg Chest Portable 1 View  Result Date: 05/22/2016 CLINICAL DATA:  Sudden onset shortness of breath.  Hypoxia. EXAM: PORTABLE CHEST 1 VIEW COMPARISON:  None. FINDINGS: Normal heart size and pulmonary vascularity. Bilateral mid lung and basilar airspace infiltrates could represent edema or consolidated pneumonia. No blunting of costophrenic angles. No pneumothorax. Mediastinal contours appear intact. Calcification of the aorta. IMPRESSION: Bilateral mid and lower lung airspace infiltrates. Electronically Signed   By: Lucienne Capers M.D.   On:  05/22/2016 02:14    EKG: Independently reviewed. Sinus, no overt sign of ACS  Assessment/Plan Active Problems:   Acute respiratory failure with hypoxia (HCC)   Sepsis (HCC)   Elevated troponin   COPD (chronic obstructive pulmonary disease) (HCC)   AKI (acute kidney injury) ([place])   Acute respiratory failure with hypoxia and Sepsis: Likely combination of COPD exacerbation, viral versus bacterial pneumonia, mild CHF. Patient initially placed on BiPAP due to  marked increased respiratory effort. ABG while on BiPAP showing pH is 7.285, PCO2 38, PO2 86. After several hours of BiPAP and being weaned to nasal cannula repeat ABG shows pH 7.39, P CO2 24, PaO2 61, bicarbonate 16.BNP 633.  Chest x-ray concerning for bilateral lower lobe infiltrates. Patient likely with undiagnosed underlying COPD given her chronic tobacco abuse. WBC 16.6 with elevated neutrophils and lymphocytes count. Patient works in a nursing home.  - Pneumonia/Sepsis ordersets - Stop Vanc/zosyn. Start Levaquin - Prednisone 60 daily, Mucinex, xopenex - f/u Cultures (sputum, blood) - Wean to RA when able - Echo -  Respiratory viral panel - uptrending lactic acid -will need IVF and close trending. Once through acute phase pt will likely need diuresis given poor pulmonary status.  - Lasix when lactic acid improving.   Elevated troponin: 0.16. No h/o CAD though no formal workup. No cardiac CP. Likely from strain from acute illness.  - trend trop - EKG in am - Tele - ASA - Cards consult if continues to elevate.   AKI: Cr 1.37. Unsure of baseline. Likely from acute illness.  - BMET in am  HTN: labile at time of admission. Likely drom sepsis. H/o renal artery stenosis s/p stent placement. No longer on BP meds.  - lopressor PRN  DVT prophylaxis: Hep  Code Status: full  Family Communication: cousin  Disposition Plan: pending improvement  Consults called: none  Admission status: inpt - tele    Dalesha Stanback J MD [facility]  If 7PM-7AM, please contact night-coverage [email or web address] Password [email or web address]  05/22/2016, 9:30 AM

## 2016-05-22 NOTE — ED Notes (Signed)
Placed on droplet precautions

## 2016-05-22 NOTE — ED Notes (Addendum)
Pt st's she became nauseated and was removed from [place] but now requesting to be put back on.

## 2016-05-22 NOTE — ED Notes (Signed)
C/o a headache 

## 2016-05-22 NOTE — ED Notes (Signed)
Spoke with Network engineer to page admit Doctor.

## 2016-05-22 NOTE — Progress Notes (Signed)
RT placed patient back on BIPAP per patient request. Patient tolerating well. RT transported patient from ED to [facility] without any complications.

## 2016-05-22 NOTE — ED Notes (Signed)
Called xray for STAT portable xray  

## 2016-05-22 NOTE — ED Notes (Signed)
Patient resting comfortably on bipap cousin leaving to go to [place]. Airway intact bilateral equal chest rise and fall.

## 2016-05-22 NOTE — ED Notes (Signed)
Pt angry that she has not been placed in inpatient room with a restroom. Pt asking to speak to supervisor. Pt states "I need a room with a restroom this isn't year 1600." I offered patient multiple times to provide a bedside commode or to wheel her to the restroom in a wheelchair. Pt refused. Pt also asking to be transported to [employer] or [facility]. Charge RN aware and will also notify admitting MD.

## 2016-05-22 NOTE — Progress Notes (Signed)
Pt is currently off Bipap. Patient does not wish to put it back on right now. Pt is in no distress at this time.

## 2016-05-22 NOTE — ED Notes (Signed)
Patient cooperating leaving blood pressure cuff on

## 2016-05-22 NOTE — ED Notes (Signed)
Admit Doctor answering service called stated to have secretary call another number.

## 2016-05-22 NOTE — Progress Notes (Signed)
    Patient's condition continues to deteriorate. Patient has been on and off BiPAP over the course of the day. Spite fluid resuscitation patient's lactic acid continues to climb or remain elevated at 5. Patient's troponin also continues to climb. Patient appears to be struggling with basic logical thought processes. She continues to interfere with medical management including clipping off her IV fluids and taking off her BiPAP. Patient does not appear to have a clear understanding with regards to the severity of her medical condition. 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 Very concerned that patient likely has a viral respiratory illness with poor underlying respiratory reserve due to undiagnosed COPD. Most recent x-ray concerning for ARDS. I have consult to critical care, evaluate patient for more aggressive management of her medical condition.    Linna Darner, MD [facility] 05/22/2016, 2:31 PM

## 2016-05-22 NOTE — ED Notes (Signed)
Paged admit Doctor.

## 2016-05-22 NOTE — ED Notes (Addendum)
Pt taken to restroom with RN. Pt repositioned in the recliner. Asked patient if I could get a hat to measure urine, pt refused. Pt refusing to wear blood pressure cuff but did agree to have BP taken once.

## 2016-05-22 NOTE — ED Notes (Signed)
Med given for her headache

## 2016-05-22 NOTE — ED Notes (Signed)
Spoke with patient regarding medications currently refusing medications at this time.  Also spoke with patient regarding pulse oximetry 88% - 90% and the need for [place] and oxygen.  Refused oxygen stating if I was in a better room with a bathroom I wouldn't be upset and my oxygen level would be fine.

## 2016-05-22 NOTE — ED Notes (Addendum)
While in room speaking with pt and updating on poc this RN witnessed pt clamping her IV bolus. This RN unclamped IV and restarted bolus. Educated pt on the need for fluids. Pt stated she would continue to reclamp. Admitting MD in for re-eval

## 2016-05-22 NOTE — ED Notes (Signed)
Patient respiratory rate decreased 30 and patient more calm.  Respiratory at bedside to place patient on bipap until blood test are resulted and have plan of care.

## 2016-05-22 NOTE — ED Provider Notes (Addendum)
By signing my name below, I, Glenda Johnson. Glenda Johnson, attest that this documentation has been prepared under the direction and in the presence of [place], DO.  Electronically Signed: [name]. Glenda Johnson, ED Scribe. 05/22/16. 2:06 AM.   TIME SEEN: 1:31 AM   CHIEF COMPLAINT:  Chief Complaint  Patient presents with  . Shortness of Breath    LEVEL 5 CAVEAT DUE TO RESPIRATORY DISTRESS  HPI:  HPI Comments: Glenda Johnson, brought in by EMS is a 60 y.o. female who presents to the Emergency Department complaining of sudden onset, constant shortness of breath onset just prior to arrival. No prior history of same. Pt was given 10mg  of albuterol, 2 of Magnesium, 125mg  of Solu-medrol, and 0.5 of Atrovent en route to department. Initial blood pressure reading was 180/109. Pt denies any chest pain, nausea, vomiting, diarrhea, or cough.No recent fever. No prior history of heart failure, asthma, or COPD. Denies any prior history of blood clots. She is a smoker. States symptoms began suddenly at home while at rest regain a glass of water. Felt "gurgling" in her chest.  Does not wear oxygen at home. 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  PCP: No primary care provider on file.    ROS: LEVEL 5 CAVEAT DUE TO RESPIRATORY DISTRESS  PAST MEDICAL HISTORY/PAST SURGICAL HISTORY:  History reviewed. No pertinent past medical history.  MEDICATIONS:  Prior to Admission medications   Not on File    ALLERGIES:  Allergies not on file  SOCIAL HISTORY:  Social History  Substance Use Topics  . Smoking status: Not on file  . Smokeless tobacco: Not on file  . Alcohol use Not on file    FAMILY HISTORY: No family history on file.  EXAM: BP 162/84   Pulse 93   Resp (!) 28   Ht 5\' 6"  (1.676 m)   Wt 130 lb (59 kg)   SpO2 97%   BMI 20.98 kg/m  CONSTITUTIONAL: Alert, moderate to severe respiratory distress HEAD: Normocephalic EYES: Conjunctivae clear, PERRL, EOMI ENT: normal  nose; no rhinorrhea; moist mucous membranes NECK: Supple, no meningismus, no nuchal rigidity, no LAD; no JVD CARD: Regular and tachycardic; S1 and S2 appreciated; no murmurs, no clicks, no rubs, no gallops RESP: tachypnic, Speaking in 1 to 2 words sentences at a time; in moderate to severe respiratory distress; slightly diminished aeration at the bases bilaterally; mild scattered expiratory wheezes; no rhonchi or rales ABD/GI: Normal bowel sounds; non-distended; soft, non-tender, no rebound, no guarding, no peritoneal signs, no hepatosplenomegaly BACK:  The back appears normal and is non-tender to palpation, there is no CVA tenderness EXT: Normal ROM in all joints; non-tender to palpation; no edema; normal capillary refill; no cyanosis, no calf tenderness or swelling    SKIN: Normal color for age and race; warm; no rash NEURO: Moves all extremities equally, sensation to light touch intact diffusely, cranial nerves II through XII intact, normal speech PSYCH: The patient's mood and manner are appropriate. Grooming and personal hygiene are appropriate.  MEDICAL DECISION MAKING: Patient here with sudden onset respiratory distress. Lung sounds seem to have improved compared to EMS report. She is not hypoxic but will place her on BiPAP for comfort. Will obtain labs, chest x-ray, ABG, EKG. Will continue albuterol since she is a smoker. Her blood pressure is now 162/84. She does not appear volume overloaded on exam.  ED PROGRESS: Patient's ABG shows a metabolic acidosis. CO2 is 38.4. We'll try to  transition her off of BiPAP. We'll obtain rectal temp, lactate, cultures, salicylate level. Unclear etiology for her metabolic acidosis.   2:30 AM  Chest x-ray shows bilateral infiltrates concerning for pulmonary edema versus pneumonia. She does have elevated lactate and a leukocytosis. States she has had pneumonia in the past has come on suddenly. I favor pneumonia in this patient. Rectal temperature is pending.  Cultures pending. Will start IV antibiotics. She is doing well on nonrebreather with sats in the low 90s. Now speaking full sentences.    2:50 AM  Pt's rectal temperature is 97.6. Unclear picture if this is pulmonary edema versus sepsis. She will receive broad-spectrum antibiotics but we will hold on further IV hydration given she is normotensive. BNP is pending. Lactate is only mildly elevated. This could be secondary to her respiratory distress rather than due to sepsis. I will see what her BNP is and decide if she needs IV Lasix.   3:25 AM  Pt's BNP is 633. We'll give IV Lasix. I feel this is a mixed picture. She does not have a PCP. We'll discuss with medicine for admission.   4:00 AM  Discussed patient's case with hospitalist, Dr. Tamala Julian.  Recommend admission to inpatient, stepdown bed.  I will place holding orders per their request. Patient and family (if present) updated with plan. Care transferred to hospitalist service.  I reviewed all nursing notes, vitals, pertinent old records, EKGs, labs, imaging (as available).   EKG Interpretation  Date/Time:  Monday May 22 2016 01:58:37 EST Ventricular Rate:  95 PR Interval:    QRS Duration: 91 QT Interval:  400 QTC Calculation: 503 R Axis:   85 Text Interpretation:  Sinus tachycardia Supraventricular bigeminy Probable left atrial enlargement Consider right ventricular hypertrophy Borderline T abnormalities, lateral leads Prolonged QT interval No old tracing to compare Confirmed by WARD,  DO, KRISTEN ([id number]) on 05/22/2016 2:35:51 AM        CRITICAL CARE Performed by: Nyra Jabs   Total critical care time: 45 minutes  Critical care time was exclusive of separately billable procedures and treating other patients.  Critical care was necessary to treat or prevent imminent or life-threatening deterioration.  Critical care was time spent personally by me on the following activities: development of treatment plan with patient  and/or surrogate as well as nursing, discussions with consultants, evaluation of patient's response to treatment, examination of patient, obtaining history from patient or surrogate, ordering and performing treatments and interventions, ordering and review of laboratory studies, ordering and review of radiographic studies, pulse oximetry and re-evaluation of patient's condition.   I personally performed the services described in this documentation, which was scribed in my presence. The recorded information has been reviewed and is accurate.    [place], DO 05/22/16 0414    6:05 AM  Pt's second lactate is more elevated.  Her respiratory status does appear to have improved. She is not hypotensive or febrile. Have discussed this with the hospitalist Dr. Tamala Julian was seen the patient. He agrees that this may be more likely flash pulmonary edema given sudden onset. He would like a CT of her chest to rule out PE and evaluate for edema versus infiltrate prior to her receiving IV fluids. She has not received a dose of IV Lasix in the emergency department. We will cancel this dose as she does appear to be improving without diuresis. Hospitalist aware of increasing lactate. They will continue to manage patient.   [place], DO 05/22/16 [phone]

## 2016-05-22 NOTE — ED Notes (Signed)
Patient removed bipap to go use the bathroom.

## 2016-05-22 NOTE — ED Notes (Signed)
C/o a headache still

## 2016-05-22 NOTE — ED Notes (Signed)
Pt repositioned into a recliner. Pillow and warm blankets provided, no distress noted. Vital signs stable at this time.

## 2016-05-23 ENCOUNTER — Inpatient Hospital Stay (HOSPITAL_COMMUNITY): Payer: PRIVATE HEALTH INSURANCE

## 2016-05-23 DIAGNOSIS — J810 Acute pulmonary edema: Secondary | ICD-10-CM

## 2016-05-23 DIAGNOSIS — J189 Pneumonia, unspecified organism: Secondary | ICD-10-CM

## 2016-05-23 DIAGNOSIS — A419 Sepsis, unspecified organism: Principal | ICD-10-CM

## 2016-05-23 DIAGNOSIS — J9601 Acute respiratory failure with hypoxia: Secondary | ICD-10-CM

## 2016-05-23 LAB — URINALYSIS, ROUTINE W REFLEX MICROSCOPIC
BILIRUBIN URINE: NEGATIVE
GLUCOSE, UA: NEGATIVE mg/dL
HGB URINE DIPSTICK: NEGATIVE
KETONES UR: 15 mg/dL — AB
LEUKOCYTES UA: NEGATIVE
Nitrite: NEGATIVE
PROTEIN: NEGATIVE mg/dL
Specific Gravity, Urine: 1.021 (ref 1.005–1.030)
pH: 5.5 (ref 5.0–8.0)

## 2016-05-23 LAB — PHOSPHORUS: Phosphorus: 3.3 mg/dL (ref 2.5–4.6)

## 2016-05-23 LAB — BLOOD GAS, ARTERIAL
ACID-BASE DEFICIT: 4.3 mmol/L — AB (ref 0.0–2.0)
Bicarbonate: 19.6 mmol/L — ABNORMAL LOW (ref 20.0–28.0)
DELIVERY SYSTEMS: POSITIVE
DRAWN BY: 32677
Expiratory PAP: 6
FIO2: 40
Inspiratory PAP: 12
O2 SAT: 98.2 %
PCO2 ART: 31.8 mmHg — AB (ref 32.0–48.0)
PO2 ART: 111 mmHg — AB (ref 83.0–108.0)
Patient temperature: 98.6
pH, Arterial: 7.407 (ref 7.350–7.450)

## 2016-05-23 LAB — STREP PNEUMONIAE URINARY ANTIGEN: STREP PNEUMO URINARY ANTIGEN: NEGATIVE

## 2016-05-23 LAB — GLUCOSE, CAPILLARY
GLUCOSE-CAPILLARY: 136 mg/dL — AB (ref 65–99)
GLUCOSE-CAPILLARY: 164 mg/dL — AB (ref 65–99)
GLUCOSE-CAPILLARY: 118 mg/dL — AB (ref 65–99)
Glucose-Capillary: 136 mg/dL — ABNORMAL HIGH (ref 65–99)
Glucose-Capillary: 215 mg/dL — ABNORMAL HIGH (ref 65–99)

## 2016-05-23 LAB — RESPIRATORY PANEL BY PCR
Adenovirus: NOT DETECTED
BORDETELLA PERTUSSIS-RVPCR: NOT DETECTED
CORONAVIRUS 229E-RVPPCR: NOT DETECTED
CORONAVIRUS HKU1-RVPPCR: NOT DETECTED
Chlamydophila pneumoniae: NOT DETECTED
Coronavirus NL63: NOT DETECTED
Coronavirus OC43: NOT DETECTED
INFLUENZA B-RVPPCR: NOT DETECTED
Influenza A: NOT DETECTED
MYCOPLASMA PNEUMONIAE-RVPPCR: NOT DETECTED
Metapneumovirus: NOT DETECTED
PARAINFLUENZA VIRUS 1-RVPPCR: NOT DETECTED
PARAINFLUENZA VIRUS 2-RVPPCR: NOT DETECTED
Parainfluenza Virus 3: NOT DETECTED
Parainfluenza Virus 4: NOT DETECTED
RESPIRATORY SYNCYTIAL VIRUS-RVPPCR: NOT DETECTED
Rhinovirus / Enterovirus: NOT DETECTED

## 2016-05-23 LAB — CBC WITH DIFFERENTIAL/PLATELET
Basophils Absolute: 0 10*3/uL (ref 0.0–0.1)
Basophils Relative: 0 %
EOS ABS: 0 10*3/uL (ref 0.0–0.7)
Eosinophils Relative: 0 %
HCT: 40.5 % (ref 36.0–46.0)
HEMOGLOBIN: 13.6 g/dL (ref 12.0–15.0)
LYMPHS ABS: 1.5 10*3/uL (ref 0.7–4.0)
Lymphocytes Relative: 10 %
MCH: 29.4 pg (ref 26.0–34.0)
MCHC: 33.6 g/dL (ref 30.0–36.0)
MCV: 87.7 fL (ref 78.0–100.0)
Monocytes Absolute: 0.2 10*3/uL (ref 0.1–1.0)
Monocytes Relative: 2 %
NEUTROS PCT: 88 %
Neutro Abs: 13.2 10*3/uL — ABNORMAL HIGH (ref 1.7–7.7)
Platelets: 209 10*3/uL (ref 150–400)
RBC: 4.62 MIL/uL (ref 3.87–5.11)
RDW: 13.2 % (ref 11.5–15.5)
WBC: 15 10*3/uL — AB (ref 4.0–10.5)

## 2016-05-23 LAB — TROPONIN I
TROPONIN I: 0.14 ng/mL — AB (ref <0.03)
Troponin I: 0.23 ng/mL (ref <0.03)
Troponin I: 0.2 ng/mL (ref <0.03)

## 2016-05-23 LAB — BASIC METABOLIC PANEL
Anion gap: 8 (ref 5–15)
Anion gap: 8 (ref 5–15)
BUN: 13 mg/dL (ref 6–20)
BUN: 14 mg/dL (ref 6–20)
CHLORIDE: 109 mmol/L (ref 101–111)
CHLORIDE: 109 mmol/L (ref 101–111)
CO2: 20 mmol/L — ABNORMAL LOW (ref 22–32)
CO2: 19 mmol/L — AB (ref 22–32)
CREATININE: 0.8 mg/dL (ref 0.44–1.00)
Calcium: 8.6 mg/dL — ABNORMAL LOW (ref 8.9–10.3)
Calcium: 9 mg/dL (ref 8.9–10.3)
Creatinine, Ser: 0.82 mg/dL (ref 0.44–1.00)
GFR calc Af Amer: >60 mL/min (ref >60)
GFR calc non Af Amer: >60 mL/min (ref >60)
GFR calc non Af Amer: >60 mL/min (ref >60)
GLUCOSE: 131 mg/dL — AB (ref 65–99)
Glucose, Bld: 137 mg/dL — ABNORMAL HIGH (ref 65–99)
POTASSIUM: 4.2 mmol/L (ref 3.5–5.1)
POTASSIUM: 4 mmol/L (ref 3.5–5.1)
SODIUM: 137 mmol/L (ref 135–145)
Sodium: 136 mmol/L (ref 135–145)

## 2016-05-23 LAB — MAGNESIUM: MAGNESIUM: 1.9 mg/dL (ref 1.7–2.4)

## 2016-05-23 LAB — PATHOLOGIST SMEAR REVIEW

## 2016-05-23 LAB — LACTIC ACID, PLASMA: LACTIC ACID, VENOUS: 1 mmol/L (ref 0.5–1.9)

## 2016-05-23 LAB — PROCALCITONIN: Procalcitonin: 0.34 ng/mL

## 2016-05-23 LAB — HIV ANTIBODY (ROUTINE TESTING W REFLEX): HIV SCREEN 4TH GENERATION: NONREACTIVE

## 2016-05-23 MED ORDER — LIP MEDEX EX OINT: TOPICAL_OINTMENT | CUTANEOUS | Status: DC | PRN

## 2016-05-23 MED ORDER — INFLUENZA VAC SPLIT QUAD 0.5 ML IM SUSY
0.5000 mL | PREFILLED_SYRINGE | INTRAMUSCULAR | Status: DC
  Filled 2016-05-23: qty 0.5

## 2016-05-23 MED ORDER — BLISTEX MEDICATED EX OINT
TOPICAL_OINTMENT | CUTANEOUS | Status: DC | PRN
  Filled 2016-05-23: qty 6.3

## 2016-05-23 MED ORDER — POTASSIUM CHLORIDE CRYS ER 20 MEQ PO TBCR
40.0000 meq | EXTENDED_RELEASE_TABLET | Freq: Three times a day (TID) | ORAL | Status: AC
  Administered 2016-05-23 (×2): 40 meq via ORAL
  Filled 2016-05-23 (×3): qty 2

## 2016-05-23 MED ORDER — LEVALBUTEROL HCL 0.63 MG/3ML IN NEBU: 0.6300 mg | INHALATION_SOLUTION | Freq: Four times a day (QID) | RESPIRATORY_TRACT | Status: DC | PRN

## 2016-05-23 MED ORDER — FUROSEMIDE 10 MG/ML IJ SOLN
40.0000 mg | Freq: Four times a day (QID) | INTRAMUSCULAR | Status: AC
  Administered 2016-05-23 – 2016-05-24 (×3): 40 mg via INTRAVENOUS
  Filled 2016-05-23 (×3): qty 4

## 2016-05-23 MED ORDER — SODIUM CHLORIDE 0.9 % IV SOLN
30.0000 meq | Freq: Once | INTRAVENOUS | Status: DC
  Filled 2016-05-23: qty 15

## 2016-05-23 MED ORDER — PNEUMOCOCCAL VAC POLYVALENT 25 MCG/0.5ML IJ INJ
0.5000 mL | INJECTION | INTRAMUSCULAR | Status: DC
  Filled 2016-05-23: qty 0.5

## 2016-05-23 MED ORDER — MAGNESIUM SULFATE IN D5W 1-5 GM/100ML-% IV SOLN
1.0000 g | Freq: Once | INTRAVENOUS | Status: AC
  Administered 2016-05-23: 1 g via INTRAVENOUS
  Filled 2016-05-23: qty 100

## 2016-05-23 NOTE — Progress Notes (Signed)
PULMONARY / CRITICAL CARE MEDICINE   Name: Glenda Johnson MRN: [id number] DOB: 1956/03/27    ADMISSION DATE:  05/22/2016 CONSULTATION DATE:  05/22/16  REFERRING MD:  Dr. 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  Symptoms began suddenly last night. She reports being well before that. She has an exposure at work to a doctor who is now admitted in [place] with pneumonia and sepsis. She notes an episode of teaspoon of hemoptysis about 5 days ago and then again today. She has symptoms of acute dyspnea, cough, white sputum production, subjective fevers, chills. Denies any recent travel. Denies chest pain, palpitation.  Patient given albuterol, 2 mg back, one 25 mg of Medrol and Atrovent nebs. Labs significant for elevated WBC count, lactic acid, BNP, troponin and creatinine. She has been given Vanco, Zosyn, levaquin. Initially she was given 40 mg of Lasix and then 3 L of IV fluid. She was placed on BiPAP but she is uncomfortable with it and keeps taking it off. PCCM called for evaluation as her respiratory status continues to deteriorate with worsening chest x-ray.  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 She is 35-pack-year smoking history and continues to smoke up to a pack a day. She routinely avoids medical treatment and has not had any regular screening test.  Imaging Dg Chest Port 1 View 05/22/2016: Significant interval decrease in bilateral airspace disease compared to earlier CXR 05/23/2016: RML and LLL airspace disease/congestion. Overall, improved compared to prior.    CULTURES: Bcx X 2 11/27> Sputun cx 11/27> RVP 11/27 > MRSA PCR negative  ANTIBIOTICS: Vanco 11/27 Zosyn 11/27 Levaquin 11/27 Ceftriaxone 11/27 >> Azithromycin 11/27 >> Tamiflu  11/27>>  LINES/TUBES: 11/27 2PIV's  SIGNIFICANT EVENTS: Admitted to ICU due to increased work of breathing on BiPAP  SUBJECTIVE:  Lying in bed with BiPAP in place. Awake, alert and responds to questions appropriately. Denies chest pain.   VITAL SIGNS: BP (!) 139/58   Pulse 92   Temp 97.8 F (36.6 C) (Oral)   Resp 17   Ht 5\' 6"  (1.676 m)   Wt 130 lb 11.7 oz (59.3 kg)   SpO2 98%   BMI 21.10 kg/m   HEMODYNAMICS:  Not on pressors  VENTILATOR SETTINGS: FiO2 (%):  [40 %] 40 %  INTAKE / OUTPUT: I/O last 3 completed shifts: In: 3395.5 [I.V.:345.5; IV Piggyback:3050] Out: 625 [Urine:625]  PHYSICAL EXAMINATION General:  Awake, oriented, on BiPAP, no distress Neuro:  Moves all 4 extremities, no focal deficits, responds to question appropirately HEENT:  BiPAP in place, PERRL Cardiovascular:  RRR, no murmurs rubs gallops Lungs:  No IWOB, bilateral coarse rhonchi, no wheeze or crackles.  Abdomen: Soft, nondistended, + BS Musculoskeletal:  Normal tone and bulk, no edema  Skin: Intact.  LABS:  BMET  Recent Labs Lab 05/22/16 0152 05/22/16 0959  NA 136 137  K 3.8 3.0*  CL 108 107  CO2 16* 18*  BUN 15 14  CREATININE 1.37* 1.24*  GLUCOSE 236* 184*    Electrolytes  Recent Labs Lab 05/22/16 0152 05/22/16 0959  CALCIUM 9.3 8.5*    CBC  Recent Labs Lab 05/22/16 0152 05/22/16 0959 05/23/16 0531  WBC 16.6* 9.2 15.0*  HGB 17.3* 14.2 13.6  HCT 50.7* 42.0 40.5  PLT 282  [place] Lab 05/22/16 0603  APTT 27  INR 1.06    Sepsis Markers  Recent Labs Lab 05/22/16 0603  05/22/16 0959  05/22/16 1711 05/22/16 1854 05/22/16 2202 05/23/16 0025  LATICACIDVEN 3.7*  < >  --   < >  --  2.6* 1.3 1.0  PROCALCITON 0.18  --  0.36  --  0.37  --   --   --   < > = values in this interval not displayed.  ABG  Recent Labs Lab 05/22/16 0202 05/22/16 0803 05/23/16 0440  PHART 7.285* 7.395 7.407  PCO2ART 38.4 24.3* 31.8*  PO2ART 86.0  61.0* 111*    Liver Enzymes  Recent Labs Lab 05/22/16 0959  AST 36  ALT 20  ALKPHOS 82  BILITOT 0.6  ALBUMIN 3.3*    Cardiac Enzymes  Recent Labs Lab 05/22/16 1300 05/22/16 1854 05/23/16 0025  TROPONINI 0.22* 0.33* 0.23*    Glucose  Recent Labs Lab 05/22/16 2110 05/22/16 2341 05/23/16 0449  GLUCAP 125* 125* 136*     DISCUSSION: 60 year old smoker with severe sepsis, acute respiratory failure, bilateral infiltrates. Rapidity of the onset with low Pct suggests a viral infection and she may be developing ARDS. PE is possible but less likely as she has b/l infiltrates as a cause of hypoxia.  ASSESSMENT / PLAN:  PULMONARY A: No history of COPD.  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 LA resolved.   P:   Weaning BiPAP this morning ABG with resp alkalosis IV solumedrol Standing nebs (Duoneb and Xopnex) Muccinex KVO once of BiPAP Antibiotics as below CT chest when stable. Start diuresing  CARDIOVASCULAR A:  CHF possible. Elevated troponin, BNP in setting of stress. Demand ischemia. Troponin down trending P:  Continue gentle fluid resuscitation Echo Follow troponins and EKG Consider diuresis  RENAL A:   AKI: resolved Urinary frequency and incontinence P:   KVO fluid when off BiPAP Monitor urine output and Cr Check UA  GASTROINTESTINAL A:   Stable P:   Keep NPO while on BiPAP  HEMATOLOGIC A:   Leukocytosis in setting of infection, sepsis LA resolved PCT negative P:  Follow CBC  INFECTIOUS A:   Sepsis. Likely source severe CAP LA resolved PCT 0.34 Leukocytosis with left shift P:   Continue ceftriaxone, azithromycin pending cultures Continue tamiflu till viral panel is back.  ENDOCRINE A:   Stable P:   SSI while on steroids  NEUROLOGIC A:   No more confusion P:   Monitor in ICU  FAMILY  - Updates: no family at bedside - Inter-disciplinary family meet or Palliative Care meeting due  by:  05/29/16  Wendee Beavers, MD.  05/23/16 7:05 AM PGY-2  Rush Farmer, M.D. [facility] Pulmonary/Critical Care Medicine. Pager: [phone]. After hours pager: [phone].

## 2016-05-23 NOTE — Progress Notes (Signed)
Pt taken off nasal cannula to room air, sat 93%.

## 2016-05-23 NOTE — Care Management Note (Signed)
Case Management Note  Patient Details  Name: Glenda Johnson MRN: [id number] Date of Birth: [birth year]  Subjective/Objective:      Pt admitted with SOB              Action/Plan:  PTA from home.  Pt will need appt with [facility] and possible [facility] at discharge.  CM will continue to follow for discharge needs   Expected Discharge Date:  05/25/16               Expected Discharge Plan:     In-House Referral:     Discharge planning Services  CM Consult  Post Acute Care Choice:    Choice offered to:     DME Arranged:    DME Agency:     HH Arranged:    HH Agency:     Status of Service:  In process, will continue to follow  If discussed at Long Length of Stay Meetings, dates discussed:    Additional Comments:  Maryclare Labrador, RN 05/23/2016, 2:10 PM

## 2016-05-23 NOTE — Progress Notes (Signed)
Pt taken off bipap and placed on a nasal cannula at 2Lpm. Sat 96%.

## 2016-05-23 NOTE — Progress Notes (Signed)
PULMONARY / CRITICAL CARE MEDICINE   Name: Glenda Johnson MRN: [id number] DOB: Jun 16, 1956    ADMISSION DATE:  05/22/2016 CONSULTATION DATE:  05/22/16  REFERRING MD:  Dr. 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  Symptoms began suddenly last night. She reports being well before that. She has an exposure at work to a doctor who is now admitted in [place] with pneumonia and sepsis. She notes an episode of teaspoon of hemoptysis about 5 days ago and then again today. She has symptoms of acute dyspnea, cough, white sputum production, subjective fevers, chills. Denies any recent travel. Denies chest pain, palpitation.  Patient given albuterol, 2 mg back, one 25 mg of Medrol and Atrovent nebs. Labs significant for elevated WBC count, lactic acid, BNP, troponin and creatinine. She has been given Vanco, Zosyn, levaquin. Initially she was given 40 mg of Lasix and then 3 L of IV fluid. She was placed on BiPAP but she is uncomfortable with it and keeps taking it off. PCCM called for evaluation as her respiratory status continues to deteriorate with worsening chest x-ray.  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 She is 35-pack-year smoking history and continues to smoke up to a pack a day. She routinely avoids medical treatment and has not had any regular screening test.  Imaging Dg Chest Port 1 View 05/22/2016: Significant interval decrease in bilateral airspace disease compared to earlier CXR 05/23/2016: RML and LLL airspace disease/congestion. Overall, improved compared to prior.    CULTURES: Bcx X 2 11/27> Sputun cx 11/27> RVP 11/27 > MRSA PCR negative  ANTIBIOTICS: Vanco 11/27 Zosyn 11/27 Levaquin 11/27 Ceftriaxone 11/27 >> Azithromycin 11/27 >> Tamiflu  11/27>>  LINES/TUBES: 11/27 2PIV's  SIGNIFICANT EVENTS: Admitted to ICU due to increased work of breathing on BiPAP  SUBJECTIVE:  No events overnight, comfortable off BiPAP.  VITAL SIGNS: BP 130/78   Pulse (!) 101   Temp 97.3 F (36.3 C) (Oral)   Resp (!) 22   Ht 5\' 6"  (1.676 m)   Wt 59.3 kg (130 lb 11.7 oz)   SpO2 96%   BMI 21.10 kg/m   HEMODYNAMICS:  Not on pressors  VENTILATOR SETTINGS: FiO2 (%):  [40 %] 40 %  INTAKE / OUTPUT: I/O last 3 completed shifts: In: 3470.5 [I.V.:420.5; IV Piggyback:3050] Out: 625 [Urine:625]  PHYSICAL EXAMINATION General:  Awake, oriented, off BiPAP, no distress Neuro:  Moves all 4 extremities, no focal deficits, responds to question appropirately HEENT:  [place]/AT, PERRL, EOM-I and MMM Cardiovascular:  RRR, no murmurs rubs gallops Lungs:  Bibasilar crackles. Abdomen: Soft, nondistended, + BS Musculoskeletal:  Normal tone and bulk, no edema  Skin: Intact.  LABS:  BMET  Recent Labs Lab 05/22/16 0959 05/23/16 0531 05/23/16 0815  NA 137 137 136  K 3.0* 4.2 4.0  CL 107 109 109  CO2 18* 20* 19*  BUN 14 13 14   CREATININE 1.24* 0.80 0.82  GLUCOSE 184* 137* 131*   Electrolytes  Recent Labs Lab 05/22/16 0959 05/23/16 0531 05/23/16 0815  CALCIUM 8.5* 8.6* 9.0  MG  --  1.9  --   PHOS  --  3.3  --    CBC  Recent Labs Lab 05/22/16 0152 05/22/16 0959 05/23/16 0531  WBC 16.6* 9.2 15.0*  HGB 17.3*  14.2 13.6  HCT 50.7* 42.0 40.5  PLT 282 232 209   Coag's  Recent Labs Lab 05/22/16 0603  APTT 27  INR 1.06   Sepsis Markers  Recent Labs Lab 05/22/16 0959  05/22/16 1711 05/22/16 1854 05/22/16 2202 05/23/16 0025 05/23/16 0531  LATICACIDVEN  --   < >  --  2.6* 1.3 1.0  --   PROCALCITON 0.36  --  0.37  --   --   --  0.34  < > = values in this interval not displayed.  ABG  Recent Labs Lab 05/22/16 0202 05/22/16 0803 05/23/16 0440  PHART 7.285* 7.395 7.407  PCO2ART 38.4 24.3* 31.8*  PO2ART 86.0 61.0*  111*   Liver Enzymes  Recent Labs Lab 05/22/16 0959  AST 36  ALT 20  ALKPHOS 82  BILITOT 0.6  ALBUMIN 3.3*   Cardiac Enzymes  Recent Labs Lab 05/22/16 1854 05/23/16 0025 05/23/16 0531  TROPONINI 0.33* 0.23* 0.20*   Glucose  Recent Labs Lab 05/22/16 2110 05/22/16 2341 05/23/16 0449 05/23/16 0800 05/23/16 1203  GLUCAP 125* 125* 136* 136* 215*   I reviewed CXR myself, bilateral pulmonary edema noted.  DISCUSSION: 60 year old smoker with severe sepsis, acute respiratory failure, bilateral infiltrates. Rapidity of the onset with low Pct suggests a viral infection and she may be developing ARDS. PE is possible but less likely as she has b/l infiltrates as a cause of hypoxia.  ASSESSMENT / PLAN:  PULMONARY A: No history of COPD.  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 LA resolved.   P:   D/C BiPAP D/C solumedrol PRN xopenex Muccinex Antibiotics as below KVO IVF Start diuresing  CARDIOVASCULAR A:  CHF possible. Elevated troponin, BNP in setting of stress. Demand ischemia. Troponin down trending P:  KVO IVF Echo pending Follow troponins and EKG  RENAL A:   AKI: resolved Urinary frequency and incontinence P:   KVO fluid when off BiPAP Monitor urine output and Cr Lasix 40 mg IV q6 x3 doses K-Dur 40 meq PO q8 x2 doses. BMET in AM Replace electrolytes as indicated  GASTROINTESTINAL A:   Stable P:   Heart healthy diet  HEMATOLOGIC A:   Leukocytosis in setting of infection, sepsis LA resolved PCT negative P:  Transfuse per ICU protocol Follow CBC  INFECTIOUS A:   Sepsis. Likely source severe CAP LA resolved PCT 0.34 Leukocytosis with left shift P:   Continue ceftriaxone, azithromycin pending cultures Continue tamiflu till viral panel is back.  ENDOCRINE A:   Stable P:   SSI while on steroids  NEUROLOGIC A:   No more confusion P:   Monitor in ICU  FAMILY  - Updates: Patient and family  updated bedside - Inter-disciplinary family meet or Palliative Care meeting due by:  05/29/16  Transfer to tele and to [facility] service with PCCM off 11/29.  Discussed with Dr. Sherral Hammers from [facility] service.  Rush Farmer, M.D. [facility] Pulmonary/Critical Care Medicine. Pager: [phone]. After hours pager: [phone].

## 2016-05-23 NOTE — Progress Notes (Signed)
Tried calling report to [facility].

## 2016-05-24 ENCOUNTER — Inpatient Hospital Stay (HOSPITAL_COMMUNITY): Payer: PRIVATE HEALTH INSURANCE

## 2016-05-24 DIAGNOSIS — N179 Acute kidney failure, unspecified: Secondary | ICD-10-CM

## 2016-05-24 DIAGNOSIS — I509 Heart failure, unspecified: Secondary | ICD-10-CM

## 2016-05-24 LAB — BASIC METABOLIC PANEL
Anion gap: 12 (ref 5–15)
BUN: 23 mg/dL — ABNORMAL HIGH (ref 6–20)
CO2: 25 mmol/L (ref 22–32)
Calcium: 9.7 mg/dL (ref 8.9–10.3)
Chloride: 101 mmol/L (ref 101–111)
Creatinine, Ser: 1.01 mg/dL — ABNORMAL HIGH (ref 0.44–1.00)
GFR calc Af Amer: >60 mL/min (ref >60)
GFR calc non Af Amer: 59 mL/min — ABNORMAL LOW (ref >60)
Glucose, Bld: 110 mg/dL — ABNORMAL HIGH (ref 65–99)
Potassium: 4.6 mmol/L (ref 3.5–5.1)
Sodium: 138 mmol/L (ref 135–145)

## 2016-05-24 LAB — GLUCOSE, CAPILLARY
Glucose-Capillary: 103 mg/dL — ABNORMAL HIGH (ref 65–99)
Glucose-Capillary: 116 mg/dL — ABNORMAL HIGH (ref 65–99)
Glucose-Capillary: 100 mg/dL — ABNORMAL HIGH (ref 65–99)
Glucose-Capillary: 93 mg/dL (ref 65–99)

## 2016-05-24 LAB — CBC
HCT: 44.8 % (ref 36.0–46.0)
Hemoglobin: 15.6 g/dL — ABNORMAL HIGH (ref 12.0–15.0)
MCH: 30.4 pg (ref 26.0–34.0)
MCHC: 34.8 g/dL (ref 30.0–36.0)
MCV: 87.3 fL (ref 78.0–100.0)
Platelets: 297 10*3/uL (ref 150–400)
RBC: 5.13 MIL/uL — ABNORMAL HIGH (ref 3.87–5.11)
RDW: 13 % (ref 11.5–15.5)
WBC: 20 10*3/uL — ABNORMAL HIGH (ref 4.0–10.5)

## 2016-05-24 LAB — LEGIONELLA PNEUMOPHILA SEROGP 1 UR AG: L. PNEUMOPHILA SEROGP 1 UR AG: NEGATIVE

## 2016-05-24 LAB — ECHOCARDIOGRAM COMPLETE
Height: 67 in
Weight: 1939.2 oz

## 2016-05-24 LAB — URINE CULTURE: CULTURE: NO GROWTH

## 2016-05-24 LAB — PHOSPHORUS: Phosphorus: 3 mg/dL (ref 2.5–4.6)

## 2016-05-24 LAB — MAGNESIUM: Magnesium: 2.3 mg/dL (ref 1.7–2.4)

## 2016-05-24 LAB — PROCALCITONIN: Procalcitonin: 0.44 ng/mL

## 2016-05-24 MED ORDER — AMLODIPINE BESYLATE 5 MG PO TABS
5.0000 mg | ORAL_TABLET | Freq: Every day | ORAL | Status: DC
  Administered 2016-05-24 – 2016-05-26 (×3): 5 mg via ORAL
  Filled 2016-05-24 (×3): qty 1

## 2016-05-24 MED ORDER — FUROSEMIDE 10 MG/ML IJ SOLN
40.0000 mg | Freq: Once | INTRAMUSCULAR | Status: AC
Start: 2016-05-24 — End: 2016-05-24
  Administered 2016-05-24: 40 mg via INTRAVENOUS
  Filled 2016-05-24: qty 4

## 2016-05-24 MED ORDER — PERFLUTREN LIPID MICROSPHERE
1.0000 mL | INTRAVENOUS | Status: DC | PRN
  Administered 2016-05-24: 2 mL via INTRAVENOUS
  Filled 2016-05-24: qty 10

## 2016-05-24 NOTE — Evaluation (Signed)
Physical Therapy Evaluation Patient Details Name: Glenda Johnson MRN: [id number] DOB: June 19, 1956 Today's Date: 05/24/2016   History of Present Illness  Patient is a 60 y/o female admitted secondary SOB. PMH including but not limited COPD, CHF, HTN and renal stenosis.  Clinical Impression  Pt presented supine in bed with HOB elevated, awake and willing to participate in therapy session. Prior to admission, pt stated that she was independent with all functional mobility and continues to work. Pt moving well during evaluation and no further acute PT needs identified at this time. PT signing off.    Follow Up Recommendations No PT follow up    Equipment Recommendations  None recommended by PT    Recommendations for Other Services       Precautions / Restrictions Precautions Precautions: Fall Restrictions Weight Bearing Restrictions: No      Mobility  Bed Mobility Overal bed mobility: Needs Assistance Bed Mobility: Supine to Sit     Supine to sit: HOB elevated;Supervision     General bed mobility comments: pt required increased time, supervision for safety  Transfers Overall transfer level: Needs assistance Equipment used: None Transfers: Sit to/from Stand Sit to Stand: Supervision         General transfer comment: pt required increased time, supervision for safety, no instability observed  Ambulation/Gait Ambulation/Gait assistance: Supervision Ambulation Distance (Feet): 200 Feet Assistive device: None Gait Pattern/deviations: Step-through pattern;Decreased stride length Gait velocity: decreased   General Gait Details: pt with no instability during ambulation, no AD used  Stairs            Wheelchair Mobility    Modified Rankin (Stroke Patients Only)       Balance Overall balance assessment: Needs assistance Sitting-balance support: Feet supported;No upper extremity supported Sitting balance-Leahy Scale: Good     Standing balance  support: During functional activity;No upper extremity supported Standing balance-Leahy Scale: Good                               Pertinent Vitals/Pain Pain Assessment: No/denies pain    Home Living Family/patient expects to be discharged to:: Private residence Living Arrangements: Alone Available Help at Discharge: Family;Available PRN/intermittently Type of Home: House Home Access: Level entry     Home Layout: One level;Other (Comment) (one step to enter different rooms) Home Equipment: None      Prior Function Level of Independence: Independent               Hand Dominance        Extremity/Trunk Assessment   Upper Extremity Assessment: Generalized weakness           Lower Extremity Assessment: Generalized weakness      Cervical / Trunk Assessment: Normal  Communication   Communication: No difficulties  Cognition Arousal/Alertness: Awake/alert Behavior During Therapy: WFL for tasks assessed/performed Overall Cognitive Status: Within Functional Limits for tasks assessed                      General Comments      Exercises     Assessment/Plan    PT Assessment Patent does not need any further PT services  PT Problem List            PT Treatment Interventions      PT Goals (Current goals can be found in the Care Plan section)  Acute Rehab PT Goals Patient Stated Goal: return    Frequency  Barriers to discharge        Co-evaluation               End of Session   Activity Tolerance: Patient tolerated treatment well Patient left: Other (comment) (pt in transport chair to go for a procedure) Nurse Communication: Mobility status         Time: [id number] PT Time Calculation (min) (ACUTE ONLY): 12 min   Charges:   PT Evaluation $PT Eval Low Complexity: 1 Procedure     PT G CodesClearnce Sorrel Dalyla Chui 05/24/2016, 12:24 PM Sherie Don, [place], DPT [phone]

## 2016-05-24 NOTE — Progress Notes (Signed)
  Echocardiogram 2D Echocardiogram with Definity has been performed.  Xee Hollman, [place] 05/24/2016, 1:46 PM

## 2016-05-24 NOTE — Care Management Note (Signed)
Case Management Note  Patient Details  Name: Glenda Johnson MRN: [id number] Date of Birth: [birth year]  Subjective/Objective:       Admitted with Acute Respiratory Failure             Action/Plan: Patient lives at home alone; PCP was Dr Paris Lore but he retired in October 2017; CM talked to his nurse and apt made with Marti Sleigh NP for Dec 11,2017 at 1 pm; information given to the patient and she is agreeable to the apt; she has private insurance with [employer] with prescription drug coverage; pharmacy of choice is [facility]; Patient is a Therapist, sports at [employer]; has not seen her PCP in 5 yrs; pt stated " I don't like doctors." CM talked to patient at length about taking care of herself and the importance of follow up visits; Patient talked about numerous changes she plan to make soon such as moving to [place] to be with her daughter; CM talked to patient about having medical care in place prior to moving so she can receive appropriate follow up care.. [place] animals that she takes care of; 9 cats in her home and 10 cats in her mothers home; raccoons and possums are there also and she feeds and cares for them. She is Independent of all of her ADL's  Expected Discharge Date:  05/25/16               Expected Discharge Plan:  Home/Self Care  Discharge planning Services  CM Consult, Follow-up appt scheduled  Status of Service:  In process, will continue to follow  Sherrilyn Rist [id number] 05/24/2016, 10:28 AM

## 2016-05-24 NOTE — Progress Notes (Addendum)
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 Apart from her work at the nursing home, she was exposed at work to her doctor who is now admitted at [facility] with pneumonia and sepsis. She also had mild intermittent hemodialysis. She has several pets at home (cats, raccoons & possum). Has not taken the flu or pneumonia shot. In ED, she was hypoxic and was briefly placed on BiPAP but was uncomfortable with it and Taking it off. CCM was consulted and patient was admitted to ICU. She improved and care was transferred over to telemetry and [facility] 11/29.   Assessment & Plan:   Active Problems:   Acute respiratory failure with hypoxia (HCC)   Acute respiratory failure (HCC)   Sepsis (HCC)   Elevated troponin   COPD (chronic obstructive pulmonary disease) (HCC)   AKI (acute kidney injury) ([place])   1. Acute respiratory failure with hypoxia: Secondary to pneumonia, sepsis and possible CHF. Treated underlying cause. Briefly on BiPAP in the ED. Hypoxia resolved. 2. Sepsis due to Severe community-acquired pneumonia: Treated per sepsis protocol. Sepsis physiology resolved. Lactate normalized. Pro-calcitonin with a flat trend. Blood cultures 2: Negative to date. Urine culture negative. RSV panel negative. Chest x-ray shows continued diffuse bilateral airspace disease. Continue IV antibiotics overnight and then transition to oral antibiotics at discharge 11/30. RSV panel negative. HIV antibody nonreactive. Influenza a and B PCR: Negative. Complete course of Tamiflu. 3. Tobacco abuse: Cessation counseled. No history of COPD. Antibiotic management as above.  Steroids discontinued. 4. Lactic acidosis: Secondary to sepsis. Resolved. 5. Possible CHF: Improved. IV fluids discontinued. Echo results as below. Status post couple doses of IV Lasix. Clinically compensated. Cardiology consult in am. 6. Elevated troponin & BMP: Flat trend. In the setting of stress and Likely from demand ischemia. 2-D echo results are abnormal: LVEF 45-50 percent and akinesis of mid-apical anteroseptal and apical myocardium and grade 3 diastolic dysfunction >consult cardiology 11/30. 7. Acute kidney injury: Resolved. 8. Leukocytosis: Secondary to sepsis, steroids. 9. Confusion: Resolved. 10. Hyperglycemia: Check A1c. Monitor CBGs. SSI as needed. 11. Secondary hypertension: History of RAS status post angioplasty several years ago. Not on antihypertensives prior to admission. She states that her blood pressure has been labile as outpatient. Uncontrolled. Start amlodipine. Monitor   DVT prophylaxis: Lovenox Code Status: Full code Family Communication: None at bedside Disposition Plan: DC home possibly in the next 1-2 days   Consultants:   Cardiology consult 11/30. Make NPO after MN in case w/u needed.  Procedures:   BiPAP-discontinued  Antimicrobials:   Zosyn 1 dose  Vancomycin 1 dose  IV ceftriaxone 11/27 >  IV azithromycin 11/28 >  IV levofloxacin 11/271 dose    Subjective: Feels much better compared to admission. Occasional blood streaks and sputum. Otherwise sputum nonproductive. No chest pain or dyspnea reported. States that she was unable to sleep well last night due to constant disturbance on the floors. Does not like heart healthy and wants regular diet.  Objective:  Vitals:   05/23/16 2117 05/23/16 2350 05/24/16 0016 05/24/16 0612  BP: (!) 164/104 (!) 154/80 (!) 143/82 (!) 141/97  Pulse: (!) 103  93 89  Resp: 20  20  20  Temp: 97.5 F (36.4 C)  98 F (36.7 C) 98.2 F (36.8 C)  TempSrc: Oral  Oral Oral  SpO2: 98%  95% 95%  Weight:    55  kg (121 lb 3.2 oz)  Height:        Intake/Output Summary (Last 24 hours) at 05/24/16 1534 Last data filed at 05/24/16 0900  Gross per 24 hour  Intake              933 ml  Output             3950 ml  Net            -3017 ml   Filed Weights   05/23/16 0456 05/23/16 1607 05/24/16 0612  Weight: 59.3 kg (130 lb 11.7 oz) 57.7 kg (127 lb 1.6 oz) 55 kg (121 lb 3.2 oz)    Examination:  General exam: Pleasant middle-aged female sitting up comfortably in bed. Respiratory system: Reduced breath sounds in the bases with scattered bibasal crackles. Rest of lung fields clear to auscultation. Respiratory effort normal. Cardiovascular system: S1 & S2 heard, RRR. No JVD, murmurs, rubs, gallops or clicks. No pedal edema. telemetry: SR-ST in the 100s.  Gastrointestinal system: Abdomen is nondistended, soft and nontender. No organomegaly or masses felt. Normal bowel sounds heard. Central nervous system: Alert and oriented. No focal neurological deficits. Extremities: Symmetric 5 x 5 power. Skin: No rashes, lesions or ulcers Psychiatry: Judgement and insight appear normal. Mood & affect appropriate.     Data Reviewed: I have personally reviewed following labs and imaging studies  CBC:  Recent Labs Lab 05/22/16 0152 05/22/16 0959 05/23/16 0531 05/24/16 0451  WBC 16.6* 9.2 15.0* 20.0*  NEUTROABS 8.2* 8.3* 13.2*  --   HGB 17.3* 14.2 13.6 15.6*  HCT 50.7* 42.0 40.5 44.8  MCV 89.9 88.1 87.7 87.3  PLT 282 232 209 [id number]   Basic Metabolic Panel:  Recent Labs Lab 05/22/16 0152 05/22/16 0959 05/23/16 0531 05/23/16 0815 05/24/16 0451  NA 136 137 137 136 138  K 3.8 3.0* 4.2 4.0 4.6  CL 108 107 109 109 101  CO2 16* 18* 20* 19* 25  GLUCOSE 236* 184* 137* 131* 110*  BUN 15 14 13 14  23*  CREATININE 1.37* 1.24* 0.80 0.82 1.01*  CALCIUM 9.3 8.5* 8.6* 9.0 9.7  MG  --   --  1.9  --  2.3  PHOS  --   --  3.3  --  3.0   GFR: Estimated Creatinine Clearance: 50.8 mL/min (by C-G formula based on SCr  of 1.01 mg/dL (H)). Liver Function Tests:  Recent Labs Lab 05/22/16 0959  AST 36  ALT 20  ALKPHOS 82  BILITOT 0.6  PROT 6.8  ALBUMIN 3.3*   No results for input(s): LIPASE, AMYLASE in the last 168 hours. No results for input(s): AMMONIA in the last 168 hours. Coagulation Profile:  Recent Labs Lab 05/22/16 0603  INR 1.06   Cardiac Enzymes:  Recent Labs Lab 05/22/16 1300 05/22/16 1854 05/23/16 0025 05/23/16 0531 05/23/16 1127  TROPONINI 0.22* 0.33* 0.23* 0.20* 0.14*   BNP (last 3 results) No results for input(s): PROBNP in the last 8760 hours. HbA1C: No results for input(s): HGBA1C in the last 72 hours. CBG:  Recent Labs Lab 05/23/16 1203 05/23/16 1618 05/23/16 2146 05/24/16 0604 05/24/16 1143  GLUCAP 215* 164* 118* 103* 116*   Lipid Profile: No results for input(s): CHOL, HDL, LDLCALC, TRIG, CHOLHDL, LDLDIRECT in the last 72 hours. Thyroid Function  Tests:  Recent Labs  05/22/16 0603  TSH 1.694   Anemia Panel: No results for input(s): VITAMINB12, FOLATE, FERRITIN, TIBC, IRON, RETICCTPCT in the last 72 hours.  Sepsis Labs:  Recent Labs Lab 05/22/16 0959  05/22/16 1645 05/22/16 1711 05/22/16 1854 05/22/16 2202 05/23/16 0025 05/23/16 0531 05/24/16 0451  PROCALCITON 0.36  --   --  0.37  --   --   --  0.34 0.44  LATICACIDVEN  --   < > 2.5*  --  2.6* 1.3 1.0  --   --   < > = values in this interval not displayed.  Recent Results (from the past 240 hour(s))  Blood culture (routine x 2)     Status: None (Preliminary result)   Collection Time: 05/22/16  2:23 AM  Result Value Ref Range Status   Specimen Description BLOOD RIGHT ARM  Final   Special Requests BOTTLES DRAWN AEROBIC AND ANAEROBIC 5CC  Final   Culture NO GROWTH 2 DAYS  Final   Report Status PENDING  Incomplete  Blood culture (routine x 2)     Status: None (Preliminary result)   Collection Time: 05/22/16  2:27 AM  Result Value Ref Range Status   Specimen Description BLOOD RIGHT ARM   Final   Special Requests IN PEDIATRIC BOTTLE 2CC  Final   Culture NO GROWTH 2 DAYS  Final   Report Status PENDING  Incomplete  Urine culture     Status: None   Collection Time: 05/22/16  9:10 AM  Result Value Ref Range Status   Specimen Description URINE, CLEAN CATCH  Final   Special Requests NONE  Final   Culture NO GROWTH  Final   Report Status 05/24/2016 FINAL  Final  Respiratory Panel by PCR     Status: None   Collection Time: 05/22/16  3:44 PM  Result Value Ref Range Status   Adenovirus NOT DETECTED NOT DETECTED Final   Coronavirus 229E NOT DETECTED NOT DETECTED Final   Coronavirus HKU1 NOT DETECTED NOT DETECTED Final   Coronavirus NL63 NOT DETECTED NOT DETECTED Final   Coronavirus OC43 NOT DETECTED NOT DETECTED Final   Metapneumovirus NOT DETECTED NOT DETECTED Final   Rhinovirus / Enterovirus NOT DETECTED NOT DETECTED Final   Influenza A NOT DETECTED NOT DETECTED Final   Influenza B NOT DETECTED NOT DETECTED Final   Parainfluenza Virus 1 NOT DETECTED NOT DETECTED Final   Parainfluenza Virus 2 NOT DETECTED NOT DETECTED Final   Parainfluenza Virus 3 NOT DETECTED NOT DETECTED Final   Parainfluenza Virus 4 NOT DETECTED NOT DETECTED Final   Respiratory Syncytial Virus NOT DETECTED NOT DETECTED Final   Bordetella pertussis NOT DETECTED NOT DETECTED Final   Chlamydophila pneumoniae NOT DETECTED NOT DETECTED Final   Mycoplasma pneumoniae NOT DETECTED NOT DETECTED Final  MRSA PCR Screening     Status: None   Collection Time: 05/22/16  8:31 PM  Result Value Ref Range Status   MRSA by PCR NEGATIVE NEGATIVE Final    Comment:        The GeneXpert MRSA Assay (FDA approved for NASAL specimens only), is one component of a comprehensive MRSA colonization surveillance program. It is not intended to diagnose MRSA infection nor to guide or monitor treatment for MRSA infections.          Radiology Studies: Dg Chest Port 1 View  Result Date: 05/23/2016 CLINICAL DATA:  Sepsis  EXAM: PORTABLE CHEST 1 VIEW COMPARISON:  05/22/2016 FINDINGS: Diffuse airspace disease again noted throughout the lungs,  unchanged. Heart is normal size. No visible effusions. No acute bony abnormality P IMPRESSION: Continued diffuse bilateral airspace disease, unchanged. Electronically Signed   By: Rolm Baptise M.D.   On: 05/23/2016 07:09        Scheduled Meds: . amLODipine  5 mg Oral Daily  . aspirin  325 mg Oral Daily  . azithromycin  500 mg Intravenous Q24H  . cefTRIAXone (ROCEPHIN)  IV  1 g Intravenous Q24H  . enoxaparin (LOVENOX) injection  40 mg Subcutaneous Daily  . guaiFENesin  1,200 mg Oral BID  . Influenza vac split quadrivalent PF  0.5 mL Intramuscular Tomorrow-1000  . nicotine  14 mg Transdermal Daily  . pneumococcal 23 valent vaccine  0.5 mL Intramuscular Tomorrow-1000  . sodium chloride flush  3 mL Intravenous Q12H  . sodium chloride flush  3 mL Intravenous Q12H   Continuous Infusions:   LOS: 2 days     [facility], MD [facility] Pager [phone]  If 7PM-7AM, please contact night-coverage [email or web address] Password [facility] 05/24/2016, 3:34 PM

## 2016-05-24 NOTE — Progress Notes (Signed)
Verbal order from MD [facility] to place patient on a regular diet Neta Mends RN 9:08 AM 05-24-2016

## 2016-05-25 ENCOUNTER — Encounter (HOSPITAL_COMMUNITY)
Admission: EM | Disposition: A | Discharge status: Home / Self Care | Payer: Self-pay | Source: Home / Self Care | Attending: Internal Medicine

## 2016-05-25 ENCOUNTER — Encounter (HOSPITAL_COMMUNITY): Payer: Self-pay | Admitting: Cardiology

## 2016-05-25 DIAGNOSIS — R931 Abnormal findings on diagnostic imaging of heart and coronary circulation: Secondary | ICD-10-CM

## 2016-05-25 DIAGNOSIS — R748 Abnormal levels of other serum enzymes: Secondary | ICD-10-CM

## 2016-05-25 HISTORY — PX: CARDIAC CATHETERIZATION: SHX172

## 2016-05-25 LAB — GLUCOSE, CAPILLARY
GLUCOSE-CAPILLARY: 90 mg/dL (ref 65–99)
GLUCOSE-CAPILLARY: 143 mg/dL — AB (ref 65–99)
GLUCOSE-CAPILLARY: 79 mg/dL (ref 65–99)
GLUCOSE-CAPILLARY: 89 mg/dL (ref 65–99)

## 2016-05-25 LAB — CBC
HCT: 43.6 % (ref 36.0–46.0)
HEMOGLOBIN: 14.7 g/dL (ref 12.0–15.0)
MCH: 29.8 pg (ref 26.0–34.0)
MCHC: 33.7 g/dL (ref 30.0–36.0)
MCV: 88.4 fL (ref 78.0–100.0)
PLATELETS: 241 10*3/uL (ref 150–400)
RBC: 4.93 MIL/uL (ref 3.87–5.11)
RDW: 13.1 % (ref 11.5–15.5)
WBC: 9.9 10*3/uL (ref 4.0–10.5)

## 2016-05-25 LAB — HEMOGLOBIN A1C
HEMOGLOBIN A1C: 5.5 % (ref 4.8–5.6)
Mean Plasma Glucose: 111 mg/dL

## 2016-05-25 LAB — EXPECTORATED SPUTUM ASSESSMENT W REFEX TO RESP CULTURE

## 2016-05-25 LAB — EXPECTORATED SPUTUM ASSESSMENT W GRAM STAIN, RFLX TO RESP C

## 2016-05-25 SURGERY — LEFT HEART CATH AND CORONARY ANGIOGRAPHY

## 2016-05-25 MED ORDER — PNEUMOCOCCAL VAC POLYVALENT 25 MCG/0.5ML IJ INJ
0.5000 mL | INJECTION | INTRAMUSCULAR | Status: AC
  Administered 2016-05-26: 0.5 mL via INTRAMUSCULAR
  Filled 2016-05-25: qty 0.5

## 2016-05-25 MED ORDER — MIDAZOLAM HCL 2 MG/2ML IJ SOLN
INTRAMUSCULAR | Status: DC | PRN
  Administered 2016-05-25: 1 mg via INTRAVENOUS

## 2016-05-25 MED ORDER — SODIUM CHLORIDE 0.9% FLUSH: 3.0000 mL | INTRAVENOUS | Status: DC | PRN

## 2016-05-25 MED ORDER — SODIUM CHLORIDE 0.9 % IV SOLN: 250.0000 mL | INTRAVENOUS | Status: DC | PRN

## 2016-05-25 MED ORDER — ONDANSETRON HCL 4 MG/2ML IJ SOLN: 4.0000 mg | Freq: Four times a day (QID) | INTRAMUSCULAR | Status: DC | PRN

## 2016-05-25 MED ORDER — SODIUM CHLORIDE 0.9 % IV SOLN
INTRAVENOUS | Status: AC
  Administered 2016-05-25: 20:00:00 via INTRAVENOUS

## 2016-05-25 MED ORDER — VERAPAMIL HCL 2.5 MG/ML IV SOLN
INTRAVENOUS | Status: AC
  Filled 2016-05-25: qty 2

## 2016-05-25 MED ORDER — SODIUM CHLORIDE 0.9 % IV SOLN
INTRAVENOUS | Status: DC
  Administered 2016-05-25: 1000 mL via INTRAVENOUS

## 2016-05-25 MED ORDER — NITROGLYCERIN 1 MG/10 ML FOR IR/CATH LAB
INTRA_ARTERIAL | Status: AC
  Filled 2016-05-25: qty 10

## 2016-05-25 MED ORDER — HEPARIN (PORCINE) IN NACL 2-0.9 UNIT/ML-% IJ SOLN
INTRAMUSCULAR | Status: AC
  Filled 2016-05-25: qty 1000

## 2016-05-25 MED ORDER — IOPAMIDOL (ISOVUE-370) INJECTION 76%
INTRAVENOUS | Status: DC | PRN
  Administered 2016-05-25: 45 mL via INTRA_ARTERIAL

## 2016-05-25 MED ORDER — HEPARIN (PORCINE) IN NACL 2-0.9 UNIT/ML-% IJ SOLN
INTRAMUSCULAR | Status: DC | PRN
  Administered 2016-05-25: 1000 mL

## 2016-05-25 MED ORDER — INFLUENZA VAC SPLIT QUAD 0.5 ML IM SUSY
0.5000 mL | PREFILLED_SYRINGE | INTRAMUSCULAR | Status: AC
  Administered 2016-05-26: 0.5 mL via INTRAMUSCULAR
  Filled 2016-05-25: qty 0.5

## 2016-05-25 MED ORDER — SODIUM CHLORIDE 0.9% FLUSH
3.0000 mL | Freq: Two times a day (BID) | INTRAVENOUS | Status: DC
  Administered 2016-05-25 – 2016-05-26 (×2): 3 mL via INTRAVENOUS

## 2016-05-25 MED ORDER — MIDAZOLAM HCL 2 MG/2ML IJ SOLN
INTRAMUSCULAR | Status: AC
  Filled 2016-05-25: qty 2

## 2016-05-25 MED ORDER — HEPARIN SODIUM (PORCINE) 1000 UNIT/ML IJ SOLN
INTRAMUSCULAR | Status: DC | PRN
  Administered 2016-05-25: 3000 [IU] via INTRAVENOUS

## 2016-05-25 MED ORDER — VERAPAMIL HCL 2.5 MG/ML IV SOLN
INTRA_ARTERIAL | Status: DC | PRN
  Administered 2016-05-25: 15 mL via INTRA_ARTERIAL

## 2016-05-25 MED ORDER — HEPARIN SODIUM (PORCINE) 1000 UNIT/ML IJ SOLN
INTRAMUSCULAR | Status: AC
  Filled 2016-05-25: qty 1

## 2016-05-25 MED ORDER — HYDRALAZINE HCL 20 MG/ML IJ SOLN
INTRAMUSCULAR | Status: DC | PRN
  Administered 2016-05-25: 10 mg via INTRAVENOUS

## 2016-05-25 MED ORDER — ACETAMINOPHEN 325 MG PO TABS
650.0000 mg | ORAL_TABLET | ORAL | Status: DC | PRN
  Administered 2016-05-25: 650 mg via ORAL

## 2016-05-25 MED ORDER — LIDOCAINE HCL (PF) 1 % IJ SOLN
INTRAMUSCULAR | Status: DC | PRN
  Administered 2016-05-25: 2 mL

## 2016-05-25 MED ORDER — HYDRALAZINE HCL 20 MG/ML IJ SOLN
INTRAMUSCULAR | Status: AC
  Filled 2016-05-25: qty 1

## 2016-05-25 MED ORDER — LIDOCAINE HCL (PF) 1 % IJ SOLN
INTRAMUSCULAR | Status: AC
  Filled 2016-05-25: qty 30

## 2016-05-25 SURGICAL SUPPLY — 12 items
CATH INFINITI 5FR ANG PIGTAIL (CATHETERS) ×2 IMPLANT
CATH OPTITORQUE TIG 4.0 5F (CATHETERS) ×2 IMPLANT
DEVICE RAD COMP TR BAND LRG (VASCULAR PRODUCTS) ×2 IMPLANT
GLIDESHEATH SLEND A-KIT 6F 22G (SHEATH) ×2 IMPLANT
GUIDEWIRE INQWIRE 1.5J.035X260 (WIRE) IMPLANT
INQWIRE 1.5J .035X260CM (WIRE) ×3
KIT HEART LEFT (KITS) ×3 IMPLANT
PACK CARDIAC CATHETERIZATION (CUSTOM PROCEDURE TRAY) ×3 IMPLANT
SYR MEDRAD MARK V 150ML (SYRINGE) ×3 IMPLANT
TRANSDUCER W/STOPCOCK (MISCELLANEOUS) ×3 IMPLANT
TUBING CIL FLEX 10 FLL-RA (TUBING) ×3 IMPLANT
WIRE HI TORQ VERSACORE-J 145CM (WIRE) ×2 IMPLANT

## 2016-05-25 NOTE — Progress Notes (Signed)
tagaderm with gauze dsg applied to right radial puncture site.noted slight brusing around area of puncture site. No bleeding noted.instructed pt not to use rt arm and keep elevated above the heart. Observe closely.

## 2016-05-25 NOTE — Progress Notes (Signed)
Attempted to  Withdraw 3cc of air at1800 from Rt radial band post cath. Noted pt arm band had mild bleeding after. Put 3cc air back in that was w/d. Paged [place] in cath lab to [place] came up to floor around 1820 to evaluate arm band. Instructed to restart w/d air 30 minutes after air was place back in and pt's arm looks good.  Will continue to monitor.  Karie Kirks, Therapist, sports.
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
## 2016-05-25 NOTE — Interval H&P Note (Signed)
Cath Lab Visit (complete for each Cath Lab visit)  Clinical Evaluation Leading to the Procedure:   ACS: Yes.    Non-ACS:    Anginal Classification: CCS I  Anti-ischemic medical therapy: No Therapy  Non-Invasive Test Results: No non-invasive testing performed  Prior CABG: No previous CABG      History and Physical Interval Note:  05/25/2016 4:36 PM  Oneita Alden Server  has presented today for surgery, with the diagnosis of cp  The various methods of treatment have been discussed with the patient and family. After consideration of risks, benefits and other options for treatment, the patient has consented to  Procedure(s): Left Heart Cath and Coronary Angiography (N/A) as a surgical intervention .  The patient's history has been reviewed, patient examined, no change in status, stable for surgery.  I have reviewed the patient's chart and labs.  Questions were answered to the patient's satisfaction.     Quay Burow

## 2016-05-25 NOTE — Progress Notes (Signed)
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 Apart from her work at the nursing home, she was exposed at work to her doctor who is now admitted at [facility] with pneumonia and sepsis. She also had mild intermittent hemodialysis. She has several pets at home (cats, raccoons & possum). Has not taken the flu or pneumonia shot. In ED, she was hypoxic and was briefly placed on BiPAP but was uncomfortable with it and Taking it off. CCM was consulted and patient was admitted to ICU. She improved and care was transferred over to telemetry and [facility] 11/29. She was admitted for sepsis secondary to severe community-acquired pneumonia. Troponins elevated and hence 2-D echo obtained which showed low EF and wall motion abnormalities. Cardiology was consulted and plan cardiac 05/25/16.   Assessment & Plan:   Active Problems:   Acute respiratory failure with hypoxia (HCC)   Acute respiratory failure (HCC)   Sepsis (HCC)   Elevated troponin   COPD (chronic obstructive pulmonary disease) (HCC)   AKI (acute kidney injury) (HCC)   Abnormal echocardiogram   1. Acute respiratory failure with hypoxia: Secondary to pneumonia, sepsis and possible CHF. Treated underlying cause. Briefly on BiPAP in the ED. Hypoxia resolved. 2. Sepsis due to Severe community-acquired pneumonia: Treated per sepsis protocol. Sepsis physiology resolved. Lactate normalized. Pro-calcitonin with a flat trend. Blood cultures 2: Negative to date. Urine culture negative. RSV panel negative. Chest x-ray shows continued diffuse bilateral airspace disease. Continue IV antibiotics and then transition  to oral antibiotics at discharge 11/30 (amoxicillin). RSV panel negative. HIV antibody nonreactive. Influenza A and B PCR: Negative. Recommend follow-up chest x-ray in 4-6 weeks to ensure resolution of pneumonia findings. 3. Tobacco abuse: Cessation counseled. No history of COPD. Antibiotic management as above. Steroids discontinued. 4. Lactic acidosis: Secondary to sepsis. Resolved. 5. Possible CHF: Improved. IV fluids discontinued. Echo results as below. Status post couple doses of IV Lasix. Clinically compensated. Cardiology consultation appreciated. 6. Elevated troponin/abnormal echo: Troponin with flat trend. In the setting of stress and kikely from demand ischemia. 2-D echo results are abnormal: LVEF 45-50 percent and akinesis of mid-apical anteroseptal and apical myocardium and grade 3 diastolic dysfunction. Cardiology consulted and planned for cardiac cath 11/30 afternoon. 7. Acute kidney injury: Resolved. 8. Leukocytosis: Secondary to sepsis, steroids. Resolved. 9. Confusion: Resolved. 10. Hyperglycemia: Check A1c: 5.5. Monitor CBGs. SSI as needed. TSH normal at 1.694 11. Secondary hypertension: History of RAS status post angioplasty several years ago. Not on antihypertensives prior to admission. She states that her blood pressure has been labile as outpatient. Uncontrolled. Started amlodipine 11/29. Mildly uncontrolled blood pressures. Await cardiology intervention i.e. To determine if she is a candidate for beta blockers. Monitor   DVT prophylaxis: Lovenox Code Status: Full code Family Communication: None at bedside Disposition Plan: DC home pending cardiology recommendations.   Consultants:   Cardiology  Procedures:   BiPAP-discontinued  Antimicrobials:   Zosyn 1 dose  Vancomycin 1 dose  IV ceftriaxone 11/27 >  IV azithromycin 11/28 >  IV levofloxacin 11/271 dose    Subjective: Seen this morning along with cardiology team. She was upset because she was made NPO  (had not been  informed-I personally apologized and provided reason for NPO for possible cardiac workup. She expressed understanding and settled down) and her stepfather died at [facility] last night (expressed condolences). Cough with minimal blood streaking sputum. No chest pain or dyspnea.  Objective:  Vitals:   05/24/16 2218 05/25/16 0517 05/25/16 0821 05/25/16 1201  BP: 139/81 127/62 (!) 155/71 140/78  Pulse: 80 72 74 76  Resp:  16 16 16   Temp: 98 F (36.7 C) 97.8 F (36.6 C) 97.7 F (36.5 C) 98.4 F (36.9 C)  TempSrc: Oral Oral Oral Oral  SpO2: 95% 95% 95% 97%  Weight:  55.7 kg (122 lb 11.2 oz)    Height:        Intake/Output Summary (Last 24 hours) at 05/25/16 1248 Last data filed at 05/25/16 1110  Gross per 24 hour  Intake              970 ml  Output             1770 ml  Net             -800 ml   Filed Weights   05/23/16 1607 05/24/16 0612 05/25/16 0517  Weight: 57.7 kg (127 lb 1.6 oz) 55 kg (121 lb 3.2 oz) 55.7 kg (122 lb 11.2 oz)    Examination:  General exam: Pleasant middle-aged female sitting up comfortably in bed.Does not look septic or toxic. Respiratory system: Reduced breath sounds in the bases with scattered bibasal crackles. Rest of lung fields clear to auscultation. Respiratory effort normal. Breath sounds in the bases are improving compared to yesterday. Cardiovascular system: S1 & S2 heard, RRR. No JVD, murmurs, rubs, gallops or clicks. No pedal edema. telemetry: SR. Gastrointestinal system: Abdomen is nondistended, soft and nontender. No organomegaly or masses felt. Normal bowel sounds heard. Central nervous system: Alert and oriented. No focal neurological deficits. Extremities: Symmetric 5 x 5 power. Skin: No rashes, lesions or ulcers Psychiatry: Judgement and insight appear normal. Mood & affect appropriate.     Data Reviewed: I have personally reviewed following labs and imaging studies  CBC:  Recent Labs Lab 05/22/16 0152  05/22/16 0959 05/23/16 0531 05/24/16 0451 05/25/16 0531  WBC 16.6* 9.2 15.0* 20.0* 9.9  NEUTROABS 8.2* 8.3* 13.2*  --   --   HGB 17.3* 14.2 13.6 15.6* 14.7  HCT 50.7* 42.0 40.5 44.8 43.6  MCV 89.9 88.1 87.7 87.3 88.4  PLT 282 232 209 297 [id number]   Basic Metabolic Panel:  Recent Labs Lab 05/22/16 0152 05/22/16 0959 05/23/16 0531 05/23/16 0815 05/24/16 0451  NA 136 137 137 136 138  K 3.8 3.0* 4.2 4.0 4.6  CL 108 107 109 109 101  CO2 16* 18* 20* 19* 25  GLUCOSE 236* 184* 137* 131* 110*  BUN 15 14 13 14  23*  CREATININE 1.37* 1.24* 0.80 0.82 1.01*  CALCIUM 9.3 8.5* 8.6* 9.0 9.7  MG  --   --  1.9  --  2.3  PHOS  --   --  3.3  --  3.0   GFR: Estimated Creatinine Clearance: 51.4 mL/min (by C-G formula based on SCr of 1.01 mg/dL (H)). Liver Function Tests:  Recent Labs Lab 05/22/16 0959  AST 36  ALT 20  ALKPHOS 82  BILITOT 0.6  PROT 6.8  ALBUMIN 3.3*   No results for input(s): LIPASE, AMYLASE in the last 168 hours. No results for input(s): AMMONIA in the last 168 hours. Coagulation Profile:  Recent Labs Lab 05/22/16 [phone]  INR 1.06   Cardiac Enzymes:  Recent Labs Lab 05/22/16 1300 05/22/16 1854 05/23/16 0025 05/23/16 0531 05/23/16 1127  TROPONINI 0.22* 0.33* 0.23* 0.20* 0.14*   BNP (last 3 results) No results for input(s): PROBNP in the last 8760 hours. HbA1C:  Recent Labs  05/24/16 0451  HGBA1C 5.5   CBG:  Recent Labs Lab 05/24/16 1143 05/24/16 1630 05/24/16 2218 05/25/16 0552 05/25/16 1142  GLUCAP 116* 100* 93 90 143*   Lipid Profile: No results for input(s): CHOL, HDL, LDLCALC, TRIG, CHOLHDL, LDLDIRECT in the last 72 hours. Thyroid Function Tests: No results for input(s): TSH, T4TOTAL, FREET4, T3FREE, THYROIDAB in the last 72 hours. Anemia Panel: No results for input(s): VITAMINB12, FOLATE, FERRITIN, TIBC, IRON, RETICCTPCT in the last 72 hours.  Sepsis Labs:  Recent Labs Lab 05/22/16 0959  05/22/16 1645 05/22/16 1711  05/22/16 1854 05/22/16 2202 05/23/16 0025 05/23/16 0531 05/24/16 0451  PROCALCITON 0.36  --   --  0.37  --   --   --  0.34 0.44  LATICACIDVEN  --   < > 2.5*  --  2.6* 1.3 1.0  --   --   < > = values in this interval not displayed.  Recent Results (from the past 240 hour(s))  Blood culture (routine x 2)     Status: None (Preliminary result)   Collection Time: 05/22/16  2:23 AM  Result Value Ref Range Status   Specimen Description BLOOD RIGHT ARM  Final   Special Requests BOTTLES DRAWN AEROBIC AND ANAEROBIC 5CC  Final   Culture NO GROWTH 2 DAYS  Final   Report Status PENDING  Incomplete  Blood culture (routine x 2)     Status: None (Preliminary result)   Collection Time: 05/22/16  2:27 AM  Result Value Ref Range Status   Specimen Description BLOOD RIGHT ARM  Final   Special Requests IN PEDIATRIC BOTTLE 2CC  Final   Culture NO GROWTH 2 DAYS  Final   Report Status PENDING  Incomplete  Urine culture     Status: None   Collection Time: 05/22/16  9:10 AM  Result Value Ref Range Status   Specimen Description URINE, CLEAN CATCH  Final   Special Requests NONE  Final   Culture NO GROWTH  Final   Report Status 05/24/2016 FINAL  Final  Respiratory Panel by PCR     Status: None   Collection Time: 05/22/16  3:44 PM  Result Value Ref Range Status   Adenovirus NOT DETECTED NOT DETECTED Final   Coronavirus 229E NOT DETECTED NOT DETECTED Final   Coronavirus HKU1 NOT DETECTED NOT DETECTED Final   Coronavirus NL63 NOT DETECTED NOT DETECTED Final   Coronavirus OC43 NOT DETECTED NOT DETECTED Final   Metapneumovirus NOT DETECTED NOT DETECTED Final   Rhinovirus / Enterovirus NOT DETECTED NOT DETECTED Final   Influenza A NOT DETECTED NOT DETECTED Final   Influenza B NOT DETECTED NOT DETECTED Final   Parainfluenza Virus 1 NOT DETECTED NOT DETECTED Final   Parainfluenza Virus 2 NOT DETECTED NOT DETECTED Final   Parainfluenza Virus 3 NOT DETECTED NOT DETECTED Final   Parainfluenza Virus 4 NOT  DETECTED NOT DETECTED Final   Respiratory Syncytial Virus NOT DETECTED NOT DETECTED Final   Bordetella pertussis NOT DETECTED NOT DETECTED Final   Chlamydophila pneumoniae NOT DETECTED NOT DETECTED Final   Mycoplasma pneumoniae NOT DETECTED NOT DETECTED Final  MRSA PCR Screening     Status: None   Collection Time: 05/22/16  8:31 PM  Result  Value Ref Range Status   MRSA by PCR NEGATIVE NEGATIVE Final    Comment:        The GeneXpert MRSA Assay (FDA approved for NASAL specimens only), is one component of a comprehensive MRSA colonization surveillance program. It is not intended to diagnose MRSA infection nor to guide or monitor treatment for MRSA infections.          Radiology Studies: No results found.      Scheduled Meds: . amLODipine  5 mg Oral Daily  . aspirin  325 mg Oral Daily  . azithromycin  500 mg Intravenous Q24H  . cefTRIAXone (ROCEPHIN)  IV  1 g Intravenous Q24H  . guaiFENesin  1,200 mg Oral BID  . [START ON 05/26/2016] Influenza vac split quadrivalent PF  0.5 mL Intramuscular Tomorrow-1000  . nicotine  14 mg Transdermal Daily  . [START ON 05/26/2016] pneumococcal 23 valent vaccine  0.5 mL Intramuscular Tomorrow-1000  . sodium chloride flush  3 mL Intravenous Q12H  . sodium chloride flush  3 mL Intravenous Q12H   Continuous Infusions: . sodium chloride 1,000 mL (05/25/16 1126)     LOS: 3 days     [facility], MD [facility] Pager [phone]  If 7PM-7AM, please contact night-coverage [email or web address] Password [email or web address] 05/25/2016, 12:48 PM
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
## 2016-05-25 NOTE — Progress Notes (Signed)
Instructed by Junie Panning, NP that pt does not need to watch the CCTV video prior to cath.  Karie Kirks, Therapist, sports.

## 2016-05-26 ENCOUNTER — Encounter (HOSPITAL_COMMUNITY): Payer: Self-pay | Admitting: Cardiovascular Disease

## 2016-05-26 DIAGNOSIS — I2510 Atherosclerotic heart disease of native coronary artery without angina pectoris: Secondary | ICD-10-CM

## 2016-05-26 DIAGNOSIS — J189 Pneumonia, unspecified organism: Secondary | ICD-10-CM

## 2016-05-26 LAB — GLUCOSE, CAPILLARY: GLUCOSE-CAPILLARY: 89 mg/dL (ref 65–99)

## 2016-05-26 MED ORDER — AZITHROMYCIN 500 MG PO TABS
500.0000 mg | ORAL_TABLET | Freq: Every day | ORAL | Status: DC
  Administered 2016-05-26: 500 mg via ORAL
  Filled 2016-05-26: qty 1

## 2016-05-26 MED ORDER — NICOTINE 14 MG/24HR TD PT24: 14.0000 mg | MEDICATED_PATCH | Freq: Every day | TRANSDERMAL | 0 refills | Status: DC

## 2016-05-26 MED ORDER — AMLODIPINE BESYLATE 10 MG PO TABS: 10.0000 mg | ORAL_TABLET | Freq: Every day | ORAL | 0 refills | Status: DC

## 2016-05-26 MED ORDER — ASPIRIN EC 81 MG PO TBEC: 81.0000 mg | DELAYED_RELEASE_TABLET | Freq: Every day | ORAL | 0 refills | Status: DC

## 2016-05-26 MED ORDER — AMOXICILLIN 500 MG PO CAPS: 500.0000 mg | ORAL_CAPSULE | Freq: Three times a day (TID) | ORAL | 0 refills | Status: DC

## 2016-05-26 NOTE — Progress Notes (Signed)
Pt refused to have blood sugar check that was scheduled at prior to [facility]unc.  All d/c instructions explained and given to pt.  Verbalized understanding.  D/c off floor at 1209 to awaiting transport via volunteer service.  Karie Kirks, Therapist, sports.

## 2016-05-26 NOTE — Discharge Summary (Addendum)
Physician Discharge Summary  Glenda Johnson [facility] [id number] DOB: [birth year]  PCP: Paris Lore, MD  Admit date: 05/22/2016 Discharge date: 05/26/2016  Recommendations for Outpatient Follow-up:  1. Marti Sleigh, NP/PCP on 06/05/2016 at 1 PM. To be seen with repeat labs (CBC & BMP).Recommend repeat chest x-ray in 3-4 weeks to ensure resolution of pneumonia findings. 2. Lyda Jester, PA-C/Cardiology on 06/13/2016 at 3 PM.  Home Health: None Equipment/Devices: None    Discharge Condition: Improved and stable  CODE STATUS: Full  Diet recommendation: Heart healthy diet.  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 Apart from her work at the nursing home, she was exposed at work to her doctor who was admitted at [facility] with pneumonia and sepsis. She also had mild intermittent hemoptysis. She has several pets at home (cats, raccoons & possum). Has not taken the flu or pneumonia shot. In ED, she was hypoxic and was briefly placed on BiPAP but was uncomfortable with it and kept taking it off. CCM was consulted and patient was admitted to ICU. She improved and care was transferred over to telemetry and [facility] 11/29. She was admitted for sepsis secondary to severe community-acquired pneumonia. Troponins elevated and hence 2-D echo obtained which showed low EF and wall motion abnormalities. Cardiology was consulted and plan cardiac 05/25/16.   Assessment & Plan:   1. Acute  respiratory failure with hypoxia: Secondary to pneumonia, sepsis and possible CHF. Treated underlying cause. Briefly on BiPAP in the ED. Hypoxia resolved. 2. Sepsis due to Severe community-acquired pneumonia: Treated per sepsis protocol. Sepsis physiology resolved. Lactate normalized. Pro-calcitonin with a flat trend. Blood cultures 2: Negative. Urine culture negative. RSV panel negative. Chest x-ray showed continued diffuse bilateral airspace disease. Completed almost 5 days of inpatient IV antibiotics (ceftriaxone and azithromycin). RSV panel negative. HIV antibody nonreactive. Influenza A and B PCR: Negative. Transitioned to oral amoxicillin at discharge to complete total 7 days antibiotic course. Sepsis physiology resolved. Clinically much improved and stable. Recommend follow-up chest x-ray in 3-4 weeks to ensure resolution of pneumonia findings >this was discussed multiple times with patient in detail and she verbalized understanding. 3. Tobacco abuse: Cessation counseled. No history of COPD.  4. Lactic acidosis: Secondary to sepsis. Resolved. 5. Possible CHF: Improved. IV fluids discontinued. Echo results as below. Status post couple doses of IV Lasix. Clinically compensated. Cardiology consultation appreciated. 6. Elevated troponin/abnormal echo: Troponin with flat trend. In the setting of stress and likely from demand ischemia. 2-D echo results are abnormal: LVEF 45-50 percent and akinesis of mid-apical anteroseptal and apical myocardium and grade 3 diastolic dysfunction. Cardiology was consulted and underwent cardiac cath 05/25/16 with mild nonobstructive CAD and LVEF estimated at 55% by cath. As per cardiology follow-up, medical management of CAD with aspirin and statin. LV function appears normal by cath. Possible wall motion abnormality on echo due to stress-induced during sepsis. Patient however refused statin. Cardiology cleared her for discharge and have arranged outpatient follow-up. Patient  however indicated to them that she is likely  moving to [place] in the next few months. Discussed with Cardiology. 7. Acute kidney injury: Resolved. 8. Leukocytosis: Secondary to sepsis, steroids. Resolved. 9. Confusion: Resolved. 10. Hyperglycemia: A1c: 5.5. Treated with SSI in the hospital and had good controlled not needing much insulin's. TSH normal at 1.694 11. Secondary hypertension: History of RAS status post angioplasty several years ago. Not on antihypertensives prior to admission. She states that her blood pressure have been labile as outpatient. Uncontrolled. Started amlodipine 11/29. Mildly uncontrolled blood pressures. Mildly uncontrolled. Patient stated that she was on amlodipine 10 MG daily in the past and she will be discharged on same at this time. Close outpatient follow-up and management as deemed necessary.  Consultants:   Cardiology  Procedures:   BiPAP-discontinued  Procedures   Left Heart Cath and Coronary Angiography  Conclusion     There is mild left ventricular systolic dysfunction.  LV end diastolic pressure is normal.  The left ventricular ejection fraction is 50-55% by visual estimate.  Ost 1st Diag to 1st Diag lesion, 60 %stenosed.  Prox RCA lesion, 30 %stenosed.    2-D echo 05/24/16: Study Conclusions  - Left ventricle: The cavity size was normal. Wall thickness was   increased in a pattern of mild LVH. Systolic function was mildly   reduced. The estimated ejection fraction was in the range of 45%   to 50%. Akinesis of the mid-apicalanteroseptal and apical   myocardium. Doppler parameters are consistent with a reversible   restrictive pattern, indicative of decreased left ventricular   diastolic compliance and/or increased left atrial pressure (grade   3 diastolic dysfunction). - Mitral valve: There was mild regurgitation   Discharge Instructions  Discharge Instructions    Call MD for:  difficulty breathing, headache or visual  disturbances    Complete by:  As directed    Call MD for:  extreme fatigue    Complete by:  As directed    Call MD for:  persistant dizziness or light-headedness    Complete by:  As directed    Call MD for:  severe uncontrolled pain    Complete by:  As directed    Call MD for:  temperature >100.4    Complete by:  As directed    Diet - low sodium heart healthy    Complete by:  As directed    Increase activity slowly    Complete by:  As directed        Medication List    TAKE these medications   amLODipine 10 MG tablet Commonly known as:  NORVASC Take 1 tablet (10 mg total) by mouth daily. Start taking on:  05/27/2016   amoxicillin 500 MG capsule Commonly known as:  AMOXIL Take 1 capsule (500 mg total) by mouth 3 (three) times daily.   aspirin EC 81 MG tablet Take 1 tablet (81 mg total) by mouth daily.   ibuprofen 200 MG tablet Commonly known as:  ADVIL,MOTRIN Take 600-800 mg by mouth every 6 (six) hours as needed for moderate pain.   nicotine 14 mg/24hr patch Commonly known as:  NICODERM CQ - dosed in mg/24 hours Place 1 patch (14 mg total) onto the skin daily. Start taking on:  05/27/2016      Follow-up Information    Paris Lore, MD Follow up on 06/05/2016.   Specialty:  Family Medicine Why:  Dr Milford Cage has retired from his medical practice. Marti Sleigh NP will see you Dec 11,2017 at 1 pm; please try to keep your apt or  call to reschedule. To be seen with repeat labs (CBC & BMP). Repeat chest XRay in 3-4 weeks. Contact information: [place] [id number] [place], PA-C Follow up.   Specialties:  Cardiology, Radiology Why:  Cardiology Hospital Follow-Up on 06/09/2016 at 10:00AM.  Contact information: [address] [place] [address] [phone]          Allergies  Allergen Reactions  . Codeine Anaphylaxis  . Citalopram Other (See Comments)    Caused more depression   . Fish Allergy  Itching    ears  . Other     Patient says that all narcotics give her anaphylaxis.   . Wellbutrin [Bupropion] Other (See Comments)    "could not speak a full sentence for a whole year"     Procedures/Studies: Dg Chest Port 1 View  Result Date: 05/23/2016 CLINICAL DATA:  Sepsis EXAM: PORTABLE CHEST 1 VIEW COMPARISON:  05/22/2016 FINDINGS: Diffuse airspace disease again noted throughout the lungs, unchanged. Heart is normal size. No visible effusions. No acute bony abnormality P IMPRESSION: Continued diffuse bilateral airspace disease, unchanged. Electronically Signed   By: Rolm Baptise M.D.   On: 05/23/2016 07:09   Dg Chest Port 1 View  Result Date: 05/22/2016 CLINICAL DATA:  Respiratory failure EXAM: PORTABLE CHEST 1 VIEW COMPARISON:  05/22/2016 10 o'clock hour FINDINGS: Diffuse airspace disease is stable in a pulmonary edema pattern. Normal heart size. No pneumothorax or pleural effusion. IMPRESSION: Worsening diffuse airspace disease. Differential diagnosis primarily includes pulmonary edema, ARDS, or bilateral pneumonia. Electronically Signed   By: Marybelle Killings M.D.   On: 05/22/2016 13:11   Dg Chest Port 1 View  Result Date: 05/22/2016 CLINICAL DATA:  Worsening shortness of breath beginning yesterday. Sepsis. Acute respiratory failure with hypoxia. EXAM: PORTABLE CHEST 1 VIEW COMPARISON:  05/22/2016 FINDINGS: The heart size and mediastinal contours are within normal limits. Bilateral pulmonary airspace disease shows significant improvement since previous study. No evidence of pneumothorax or pleural effusion. IMPRESSION: Significant interval decrease in bilateral airspace disease. Electronically Signed   By: Earle Gell M.D.   On: 05/22/2016 10:27   Dg Chest Portable 1 View  Result Date: 05/22/2016 CLINICAL DATA:  Sudden onset shortness of breath.  Hypoxia. EXAM: PORTABLE CHEST 1 VIEW COMPARISON:  None. FINDINGS: Normal heart size and pulmonary vascularity. Bilateral mid lung and  basilar airspace infiltrates could represent edema or consolidated pneumonia. No blunting of costophrenic angles. No pneumothorax. Mediastinal contours appear intact. Calcification of the aorta. IMPRESSION: Bilateral mid and lower lung airspace infiltrates. Electronically Signed   By: Lucienne Capers M.D.   On: 05/22/2016 02:14      Subjective: Seen on morning of discharge. Patient was anxious to go home and had already arranged for a right to pick her up before known. No further coughing up blood. Minimal dry cough. No dyspnea or chest pain. Denies any other complaints.  Discharge Exam:  Vitals:   05/25/16 2223 05/26/16 0014 05/26/16 0532 05/26/16 0959  BP: (!) 174/86 135/75 136/87 (!) 167/93  Pulse: 86 83 73   Resp:  18 16   Temp:  98 F (36.7 C) 97.8 F (36.6 C)   TempSrc:  Oral Oral   SpO2:  97% 93%   Weight:   55.2 kg (121 lb 11.2 oz)   Height:        General exam: Pleasant middle-aged female sitting up comfortably in bed.Does not look septic or toxic.  Respiratory system: Reduced breath sounds in the bases with occasional bibasal crackles> continues to improve. Rest of lung fields clear to auscultation. Respiratory effort normal.  Cardiovascular system: S1 & S2 heard, RRR. No JVD, murmurs, rubs, gallops or clicks. No pedal edema. Telemetry: SR. Gastrointestinal system: Abdomen is nondistended, soft and nontender. No organomegaly or masses felt. Normal bowel sounds heard. Central nervous system: Alert and oriented. No focal neurological deficits. Extremities: Symmetric 5 x 5 power. Skin: No rashes, lesions or ulcers Psychiatry: Judgement and insight appear normal. Mood & affect appropriate. cyanosis    The results of significant diagnostics from this hospitalization (including imaging, microbiology, ancillary and laboratory) are listed below for reference.     Microbiology: Recent Results (from the past 240 hour(s))  Blood culture (routine x 2)     Status: None  (Preliminary result)   Collection Time: 05/22/16  2:23 AM  Result Value Ref Range Status   Specimen Description BLOOD RIGHT ARM  Final   Special Requests BOTTLES DRAWN AEROBIC AND ANAEROBIC 5CC  Final   Culture NO GROWTH 3 DAYS  Final   Report Status PENDING  Incomplete  Blood culture (routine x 2)     Status: None (Preliminary result)   Collection Time: 05/22/16  2:27 AM  Result Value Ref Range Status   Specimen Description BLOOD RIGHT ARM  Final   Special Requests IN PEDIATRIC BOTTLE 2CC  Final   Culture NO GROWTH 3 DAYS  Final   Report Status PENDING  Incomplete  Urine culture     Status: None   Collection Time: 05/22/16  9:10 AM  Result Value Ref Range Status   Specimen Description URINE, CLEAN CATCH  Final   Special Requests NONE  Final   Culture NO GROWTH  Final   Report Status 05/24/2016 FINAL  Final  Respiratory Panel by PCR     Status: None   Collection Time: 05/22/16  3:44 PM  Result Value Ref Range Status   Adenovirus NOT DETECTED NOT DETECTED Final   Coronavirus 229E NOT DETECTED NOT DETECTED Final   Coronavirus HKU1 NOT DETECTED NOT DETECTED Final   Coronavirus NL63 NOT DETECTED NOT DETECTED Final   Coronavirus OC43 NOT DETECTED NOT DETECTED Final   Metapneumovirus NOT DETECTED NOT DETECTED Final   Rhinovirus / Enterovirus NOT DETECTED NOT DETECTED Final   Influenza A NOT DETECTED NOT DETECTED Final   Influenza B NOT DETECTED NOT DETECTED Final   Parainfluenza Virus 1 NOT DETECTED NOT DETECTED Final   Parainfluenza Virus 2 NOT DETECTED NOT DETECTED Final   Parainfluenza Virus 3 NOT DETECTED NOT DETECTED Final   Parainfluenza Virus 4 NOT DETECTED NOT DETECTED Final   Respiratory Syncytial Virus NOT DETECTED NOT DETECTED Final   Bordetella pertussis NOT DETECTED NOT DETECTED Final   Chlamydophila pneumoniae NOT DETECTED NOT DETECTED Final   Mycoplasma pneumoniae NOT DETECTED NOT DETECTED Final  MRSA PCR Screening     Status: None   Collection Time: 05/22/16  8:31  PM  Result Value Ref Range Status   MRSA by PCR NEGATIVE NEGATIVE Final    Comment:        The GeneXpert MRSA Assay (FDA approved for NASAL specimens only), is one component of a comprehensive MRSA colonization surveillance program. It is not intended to diagnose MRSA infection nor to guide or monitor treatment for MRSA infections.   Culture, sputum-assessment     Status: None   Collection Time: 05/25/16  8:33 AM  Result Value Ref Range Status  Specimen Description EXPECTORATED SPUTUM  Final   Special Requests NONE  Final   Sputum evaluation   Final    MICROSCOPIC FINDINGS SUGGEST THAT THIS SPECIMEN IS NOT REPRESENTATIVE OF LOWER RESPIRATORY SECRETIONS. PLEASE RECOLLECT. Gram Stain Report Called to,Read Back By and Verified With: RN [email or web address] [id number] MLM    Report Status 05/25/2016 FINAL  Final     Labs: BNP (last 3 results)  Recent Labs  05/22/16 0152  BNP 123456*   Basic Metabolic Panel:  Recent Labs Lab 05/22/16 0152 05/22/16 0959 05/23/16 0531 05/23/16 0815 05/24/16 0451  NA 136 137 137 136 138  K 3.8 3.0* 4.2 4.0 4.6  CL 108 107 109 109 101  CO2 16* 18* 20* 19* 25  GLUCOSE 236* 184* 137* 131* 110*  BUN 15 14 13 14  23*  CREATININE 1.37* 1.24* 0.80 0.82 1.01*  CALCIUM 9.3 8.5* 8.6* 9.0 9.7  MG  --   --  1.9  --  2.3  PHOS  --   --  3.3  --  3.0   Liver Function Tests:  Recent Labs Lab 05/22/16 0959  AST 36  ALT 20  ALKPHOS 82  BILITOT 0.6  PROT 6.8  ALBUMIN 3.3*   No results for input(s): LIPASE, AMYLASE in the last 168 hours. No results for input(s): AMMONIA in the last 168 hours. CBC:  Recent Labs Lab 05/22/16 0152 05/22/16 0959 05/23/16 0531 05/24/16 0451 05/25/16 0531  WBC 16.6* 9.2 15.0* 20.0* 9.9  NEUTROABS 8.2* 8.3* 13.2*  --   --   HGB 17.3* 14.2 13.6 15.6* 14.7  HCT 50.7* 42.0 40.5 44.8 43.6  MCV 89.9 88.1 87.7 87.3 88.4  PLT 282 232 209 297 241   Cardiac Enzymes:  Recent Labs Lab 05/22/16 1300 05/22/16 1854  05/23/16 0025 05/23/16 0531 05/23/16 1127  TROPONINI 0.22* 0.33* 0.23* 0.20* 0.14*   BNP: Invalid input(s): POCBNP CBG:  Recent Labs Lab 05/25/16 0552 05/25/16 1142 05/25/16 1617 05/25/16 2222 05/26/16 0629  GLUCAP 90 143* 79 89 89   Hgb A1c  Recent Labs  05/24/16 0451  HGBA1C 5.5   Urinalysis    Component Value Date/Time   COLORURINE YELLOW 05/22/2016 0910   APPEARANCEUR CLEAR 05/22/2016 0910   LABSPEC 1.021 05/22/2016 0910   PHURINE 5.5 05/22/2016 0910   GLUCOSEU NEGATIVE 05/22/2016 0910   HGBUR NEGATIVE 05/22/2016 0910   BILIRUBINUR NEGATIVE 05/22/2016 0910   KETONESUR 15 (A) 05/22/2016 0910   PROTEINUR NEGATIVE 05/22/2016 0910   NITRITE NEGATIVE 05/22/2016 0910   LEUKOCYTESUR NEGATIVE 05/22/2016 0910     Time coordinating discharge: Over 30 minutes  SIGNED:  Vernell Leep, MD, FACP, FHM. [facility] Pager [phone]  If 7PM-7AM, please contact night-coverage [email or web address] Password [email or web address] 05/26/2016, 10:28 AM

## 2016-05-26 NOTE — Discharge Instructions (Signed)
Community-Acquired Pneumonia, Adult °Pneumonia is an infection of the lungs. There are different types of pneumonia. One type can develop while a person is in a hospital. A different type, called community-acquired pneumonia, develops in people who are not, or have not recently been, in the hospital or other health care facility. °What are the causes? °Pneumonia may be caused by bacteria, viruses, or funguses. Community-acquired pneumonia is often caused by Streptococcus pneumonia bacteria. These bacteria are often passed from one person to another by breathing in droplets from the cough or sneeze of an infected person. °What increases the risk? °The condition is more likely to develop in: °· People who have chronic diseases, such as chronic obstructive pulmonary disease (COPD), asthma, congestive heart failure, cystic fibrosis, diabetes, or kidney disease. °· People who have early-stage or late-stage HIV. °· People who have sickle cell disease. °· People who have had their spleen removed (splenectomy). °· People who have poor dental hygiene. °· People who have medical conditions that increase the risk of breathing in (aspirating) secretions their own mouth and nose. °· People who have a weakened immune system (immunocompromised). °· People who smoke. °· People who travel to areas where pneumonia-causing germs commonly exist. °· People who are around animal habitats or animals that have pneumonia-causing germs, including birds, bats, rabbits, cats, and farm animals. ° °What are the signs or symptoms? °Symptoms of this condition include: °· A dry cough. °· A wet (productive) cough. °· Fever. °· Sweating. °· Chest pain, especially when breathing deeply or coughing. °· Rapid breathing or difficulty breathing. °· Shortness of breath. °· Shaking chills. °· Fatigue. °· Muscle aches. ° °How is this diagnosed? °Your health care provider will take a medical history and perform a physical exam. You may also have other tests,  including: °· Imaging studies of your chest, including X-rays. °· Tests to check your blood oxygen level and other blood gases. °· Other tests on blood, mucus (sputum), fluid around your lungs (pleural fluid), and urine. ° °If your pneumonia is severe, other tests may be done to identify the specific cause of your illness. °How is this treated? °The type of treatment that you receive depends on many factors, such as the cause of your pneumonia, the medicines you take, and other medical conditions that you have. For most adults, treatment and recovery from pneumonia may occur at home. In some cases, treatment must happen in a hospital. Treatment may include: °· Antibiotic medicines, if the pneumonia was caused by bacteria. °· Antiviral medicines, if the pneumonia was caused by a virus. °· Medicines that are given by mouth or through an IV tube. °· Oxygen. °· Respiratory therapy. ° °Although rare, treating severe pneumonia may include: °· Mechanical ventilation. This is done if you are not breathing well on your own and you cannot maintain a safe blood oxygen level. °· Thoracentesis. This procedure removes fluid around one lung or both lungs to help you breathe better. ° °Follow these instructions at home: °· Take over-the-counter and prescription medicines only as told by your health care provider. °? Only take cough medicine if you are losing sleep. Understand that cough medicine can prevent your body’s natural ability to remove mucus from your lungs. °? If you were prescribed an antibiotic medicine, take it as told by your health care provider. Do not stop taking the antibiotic even if you start to feel better. °· Sleep in a semi-upright position at night. Try sleeping in a reclining chair, or place a few pillows under your head. °· Do not use tobacco products, including cigarettes, chewing   tobacco, and e-cigarettes. If you need help quitting, ask your health care provider. °· Drink enough water to keep your urine  clear or pale yellow. This will help to thin out mucus secretions in your lungs. °How is this prevented? °There are ways that you can decrease your risk of developing community-acquired pneumonia. Consider getting a pneumococcal vaccine if: °· You are older than 61 years of age. °· You are older than 61 years of age and are undergoing cancer treatment, have chronic lung disease, or have other medical conditions that affect your immune system. Ask your health care provider if this applies to you. ° °There are different types and schedules of pneumococcal vaccines. Ask your health care provider which vaccination option is best for you. °You may also prevent community-acquired pneumonia if you take these actions: °· Get an influenza vaccine every year. Ask your health care provider which type of influenza vaccine is best for you. °· Go to the dentist on a regular basis. °· Wash your hands often. Use hand sanitizer if soap and water are not available. ° °Contact a health care provider if: °· You have a fever. °· You are losing sleep because you cannot control your cough with cough medicine. °Get help right away if: °· You have worsening shortness of breath. °· You have increased chest pain. °· Your sickness becomes worse, especially if you are an older adult or have a weakened immune system. °· You cough up blood. °This information is not intended to replace advice given to you by your health care provider. Make sure you discuss any questions you have with your health care provider. °Document Released: 06/12/2005 Document Revised: 10/21/2015 Document Reviewed: 10/07/2014 °[employer]vier Interactive Patient Education © 2017 [employer] ° °

## 2016-05-26 NOTE — Progress Notes (Signed)
     SUBJECTIVE: No chest pain  BP 136/87 (BP Location: Left Arm)   Pulse 73   Temp 97.8 F (36.6 C) (Oral)   Resp 16   Ht 5\' 7"  (1.702 m)   Wt 121 lb 11.2 oz (55.2 kg) Comment: scale a  SpO2 93%   BMI 19.06 kg/m   Intake/Output Summary (Last 24 hours) at 05/26/16 0836 Last data filed at 05/26/16 0600  Gross per 24 hour  Intake              360 ml  Output             2150 ml  Net            -1790 ml    PHYSICAL EXAM General: Well developed, well nourished, in no acute distress. Alert and oriented x 3.  Psych:  Good affect, responds appropriately Neck: No JVD. No masses noted.  Lungs: Clear bilaterally with no wheezes or rhonci noted.  Heart: RRR with no murmurs noted. Abdomen: Bowel sounds are present. Soft, non-tender.  Extremities: No lower extremity edema.   LABS: Basic Metabolic Panel:  Recent Labs  05/24/16 0451  NA 138  K 4.6  CL 101  CO2 25  GLUCOSE 110*  BUN 23*  CREATININE 1.01*  CALCIUM 9.7  MG 2.3  PHOS 3.0   CBC:  Recent Labs  05/24/16 0451 05/25/16 0531  WBC 20.0* 9.9  HGB 15.6* 14.7  HCT 44.8 43.6  MCV 87.3 88.4  PLT 297 241   Cardiac Enzymes:  Recent Labs  05/23/16 1127  TROPONINI 0.14*   Current Meds: . amLODipine  5 mg Oral Daily  . aspirin  325 mg Oral Daily  . azithromycin  500 mg Intravenous Q24H  . cefTRIAXone (ROCEPHIN)  IV  1 g Intravenous Q24H  . guaiFENesin  1,200 mg Oral BID  . Influenza vac split quadrivalent PF  0.5 mL Intramuscular Tomorrow-1000  . nicotine  14 mg Transdermal Daily  . pneumococcal 23 valent vaccine  0.5 mL Intramuscular Tomorrow-1000  . sodium chloride flush  3 mL Intravenous Q12H  . sodium chloride flush  3 mL Intravenous Q12H  . sodium chloride flush  3 mL Intravenous Q12H     ASSESSMENT AND PLAN: 60 yo female with history of RAS, HTN, tobacco abuse admitted with CAP/sepsis She had an elevated troponin. No chest pain. Echo with LVEF=45-50% with apical wall motion abnormality and  abnormal EKG. Cardiac cath 05/25/16 with mild non-obstructive CAD. LVEF estimated at 55% by cath.   1. CAD: Plan medical management of CAD with ASA and statin. LV function appears normal by cath. Possible wall motion abnormality on echo due to stress induced during sepsis. Will continue ASA. She refuses to start a statin .Will need f/u in our office with office APP in 2 weeks. She is likely moving to [place] in the next few months.      Lauree Chandler  12/1/20178:36 AM

## 2016-05-27 LAB — CULTURE, BLOOD (ROUTINE X 2)
CULTURE: NO GROWTH
Culture: NO GROWTH

## 2016-06-07 ENCOUNTER — Encounter: Payer: Self-pay | Admitting: Cardiology

## 2016-06-09 ENCOUNTER — Ambulatory Visit: Payer: PRIVATE HEALTH INSURANCE | Admitting: Cardiology

## 2016-06-13 ENCOUNTER — Encounter: Payer: Self-pay | Admitting: Cardiology

## 2016-06-13 ENCOUNTER — Ambulatory Visit (INDEPENDENT_AMBULATORY_CARE_PROVIDER_SITE_OTHER): Payer: PRIVATE HEALTH INSURANCE | Admitting: Cardiology

## 2016-06-13 VITALS — BP 160/76 | HR 60 | Ht 66.0 in | Wt 121.5 lb

## 2016-06-13 DIAGNOSIS — I2510 Atherosclerotic heart disease of native coronary artery without angina pectoris: Secondary | ICD-10-CM

## 2016-06-13 NOTE — Progress Notes (Signed)
06/13/2016 Glenda Johnson [facility]   1955/12/14  [id number]  Primary Physician Paris Lore, MD Primary Cardiologist: Dr. Angelena Form    Reason for Visit/CC: [facility] F/u for nonobstructive CAD   HPI:  Glenda Johnson presents to clinic today for post hospital f/u after recent admission to [facility].   To summarize, she is a 60 yo female with history of RAS, HTN, tobacco abuse who was recently admitted with CAP/sepsis. She had an elevated troponin. No chest pain. Echo with LVEF=45-50% with apical wall motion abnormality and abnormal EKG. Subsequent cardiac cath 05/25/16 with mild non-obstructive CAD, 60% first diag and 30% RCA. LVEF estimated at 55% by cath. Medical therapy was recommended. She was continued on ASA but refused statin due to side effects.   Today in f/u, she reports that she has done well. No symptoms of chest pain or dyspnea. Her BP is elevated today in clinic at 160/76. She reports full compliance with amlodipine. This is the only agent she is on currently, 10 mg daily. Given elevated BP and RAS, I recommend addition of another antihypersive, however she wants to wait to discuss with her PCP    Current Meds  Medication Sig  . amLODipine (NORVASC) 10 MG tablet Take 1 tablet (10 mg total) by mouth daily.  [name] aspirin EC 81 MG tablet Take 1 tablet (81 mg total) by mouth daily.  [name] ibuprofen (ADVIL,MOTRIN) 200 MG tablet Take 600-800 mg by mouth every 6 (six) hours as needed for moderate pain.  . nicotine (NICODERM CQ - DOSED IN MG/24 HOURS) 14 mg/24hr patch Place 1 patch (14 mg total) onto the skin daily.   Allergies  Allergen Reactions  . Codeine Anaphylaxis  . Citalopram Other (See Comments)    Caused more depression   . Fish Allergy Itching    ears  . Other     Patient says that all narcotics give her anaphylaxis.   . Wellbutrin [Bupropion] Other (See Comments)    "could not speak a full sentence for a whole year"   Past Medical History:  Diagnosis Date  . Renal artery  stenosis ([place])   . Secondary hypertension   . Tobacco abuse    Family History  Problem Relation Age of Onset  . Diabetes Maternal Grandmother    Past Surgical History:  Procedure Laterality Date  . BACK SURGERY    . CARDIAC CATHETERIZATION N/A 05/25/2016   Procedure: Left Heart Cath and Coronary Angiography;  Surgeon: Lorretta Harp, MD;  Location: [facility] [facility];  Service: Cardiovascular;  Laterality: N/A;  . stent renal artery     Social History   Social History  . Marital status: Legally Separated    Spouse name: N/A  . Number of children: N/A  . Years of education: N/A   Occupational History  . Not on file.   Social History Main Topics  . Smoking status: Current Every Day Smoker  . Smokeless tobacco: Never Used  . Alcohol use No  . Drug use: No  . Sexual activity: Not on file   Other Topics Concern  . Not on file   Social History Narrative  . No narrative on file     Review of Systems: General: negative for chills, fever, night sweats or weight changes.  Cardiovascular: negative for chest pain, dyspnea on exertion, edema, orthopnea, palpitations, paroxysmal nocturnal dyspnea or shortness of breath Dermatological: negative for rash Respiratory: negative for cough or wheezing Urologic: negative for hematuria Abdominal: negative for nausea, vomiting, diarrhea,  bright red blood per rectum, melena, or hematemesis Neurologic: negative for visual changes, syncope, or dizziness All other systems reviewed and are otherwise negative except as noted above.   Physical Exam:  Blood pressure (!) 160/76, pulse 60, height 5\' 6"  (1.676 m), weight 121 lb 8 oz (55.1 kg).  General appearance: alert, cooperative and no distress Neck: no carotid bruit and no JVD Lungs: clear to auscultation bilaterally Heart: regular rate and rhythm and 1/6 murmur along LSB Extremities: extremities normal, atraumatic, no cyanosis or edema Pulses: 2+ and symmetric Skin: Skin color,  texture, turgor normal. No rashes or lesions Neurologic: Grossly normal  EKG not performed  ASSESSMENT AND PLAN:   1. Nonobstructive CAD: recent cath showed 30% disease in the RCA and 60% stenosis in the 1st diag. Normal LVEF. No symptoms. Continue medical therapy for secondary prevention. She is on ASA. She continues to refuse statins, despite recommendations. No BB given baseline bradycardia. Pt also refusing addition of other antihypertensives for BP (also has RAS). This is being followed by her PCP. She wants to further discuss with her PCP before adding additional meds for BP. F/u in 6 months. However patient reports that she may be moving to [place] soon.     Rosann Gorum PA-C 06/13/2016 3:12 PM

## 2016-06-13 NOTE — Patient Instructions (Addendum)
Medication Instructions:  Your physician recommends that you continue on your current medications as directed. Please refer to the Current Medication list given to you today.   Labwork: None  Testing/Procedures: None  Follow-Up: Your physician wants you to follow-up in: 6 months with Dr Angelena Form. (June 2018).  You will receive a reminder letter in the mail two months in advance. If you don't receive a letter, please call our office to schedule the follow-up appointment.        If you need a refill on your cardiac medications before your next appointment, please call your pharmacy.

## 2017-09-15 ENCOUNTER — Inpatient Hospital Stay (HOSPITAL_COMMUNITY): Payer: BLUE CROSS/BLUE SHIELD

## 2017-09-15 ENCOUNTER — Other Ambulatory Visit: Payer: Self-pay

## 2017-09-15 ENCOUNTER — Emergency Department (HOSPITAL_COMMUNITY): Payer: BLUE CROSS/BLUE SHIELD

## 2017-09-15 ENCOUNTER — Inpatient Hospital Stay (HOSPITAL_COMMUNITY)
Admission: EM | Admit: 2017-09-15 | Discharge: 2017-09-24 | DRG: 280 | Disposition: A | Discharge status: Skilled Nursing Facility | Payer: BLUE CROSS/BLUE SHIELD | Attending: Family Medicine | Admitting: Family Medicine

## 2017-09-15 DIAGNOSIS — R414 Neurologic neglect syndrome: Secondary | ICD-10-CM | POA: Diagnosis not present

## 2017-09-15 DIAGNOSIS — T502X5A Adverse effect of carbonic-anhydrase inhibitors, benzothiadiazides and other diuretics, initial encounter: Secondary | ICD-10-CM | POA: Diagnosis present

## 2017-09-15 DIAGNOSIS — E876 Hypokalemia: Secondary | ICD-10-CM | POA: Diagnosis present

## 2017-09-15 DIAGNOSIS — Z7982 Long term (current) use of aspirin: Secondary | ICD-10-CM | POA: Diagnosis not present

## 2017-09-15 DIAGNOSIS — R0602 Shortness of breath: Secondary | ICD-10-CM | POA: Diagnosis present

## 2017-09-15 DIAGNOSIS — Z9114 Patient's other noncompliance with medication regimen: Secondary | ICD-10-CM | POA: Diagnosis not present

## 2017-09-15 DIAGNOSIS — I6330 Cerebral infarction due to thrombosis of unspecified cerebral artery: Secondary | ICD-10-CM | POA: Diagnosis not present

## 2017-09-15 DIAGNOSIS — I130 Hypertensive heart and chronic kidney disease with heart failure and stage 1 through stage 4 chronic kidney disease, or unspecified chronic kidney disease: Secondary | ICD-10-CM | POA: Diagnosis present

## 2017-09-15 DIAGNOSIS — I482 Chronic atrial fibrillation: Secondary | ICD-10-CM | POA: Diagnosis present

## 2017-09-15 DIAGNOSIS — G4710 Hypersomnia, unspecified: Secondary | ICD-10-CM | POA: Diagnosis not present

## 2017-09-15 DIAGNOSIS — R1013 Epigastric pain: Secondary | ICD-10-CM

## 2017-09-15 DIAGNOSIS — B1920 Unspecified viral hepatitis C without hepatic coma: Secondary | ICD-10-CM | POA: Diagnosis present

## 2017-09-15 DIAGNOSIS — I10 Essential (primary) hypertension: Secondary | ICD-10-CM | POA: Diagnosis not present

## 2017-09-15 DIAGNOSIS — I5043 Acute on chronic combined systolic (congestive) and diastolic (congestive) heart failure: Secondary | ICD-10-CM | POA: Diagnosis present

## 2017-09-15 DIAGNOSIS — F17210 Nicotine dependence, cigarettes, uncomplicated: Secondary | ICD-10-CM | POA: Diagnosis present

## 2017-09-15 DIAGNOSIS — A419 Sepsis, unspecified organism: Secondary | ICD-10-CM

## 2017-09-15 DIAGNOSIS — H538 Other visual disturbances: Secondary | ICD-10-CM | POA: Diagnosis not present

## 2017-09-15 DIAGNOSIS — D6489 Other specified anemias: Secondary | ICD-10-CM | POA: Diagnosis present

## 2017-09-15 DIAGNOSIS — I21A1 Myocardial infarction type 2: Secondary | ICD-10-CM | POA: Diagnosis present

## 2017-09-15 DIAGNOSIS — J9601 Acute respiratory failure with hypoxia: Secondary | ICD-10-CM | POA: Diagnosis not present

## 2017-09-15 DIAGNOSIS — I480 Paroxysmal atrial fibrillation: Secondary | ICD-10-CM | POA: Diagnosis present

## 2017-09-15 DIAGNOSIS — N170 Acute kidney failure with tubular necrosis: Secondary | ICD-10-CM | POA: Diagnosis present

## 2017-09-15 DIAGNOSIS — R1084 Generalized abdominal pain: Secondary | ICD-10-CM | POA: Diagnosis not present

## 2017-09-15 DIAGNOSIS — I5033 Acute on chronic diastolic (congestive) heart failure: Secondary | ICD-10-CM | POA: Diagnosis not present

## 2017-09-15 DIAGNOSIS — N183 Chronic kidney disease, stage 3 (moderate): Secondary | ICD-10-CM | POA: Diagnosis present

## 2017-09-15 DIAGNOSIS — E872 Acidosis: Secondary | ICD-10-CM | POA: Diagnosis present

## 2017-09-15 DIAGNOSIS — I701 Atherosclerosis of renal artery: Secondary | ICD-10-CM | POA: Diagnosis present

## 2017-09-15 DIAGNOSIS — I2510 Atherosclerotic heart disease of native coronary artery without angina pectoris: Secondary | ICD-10-CM | POA: Diagnosis present

## 2017-09-15 DIAGNOSIS — J449 Chronic obstructive pulmonary disease, unspecified: Secondary | ICD-10-CM | POA: Diagnosis present

## 2017-09-15 DIAGNOSIS — R222 Localized swelling, mass and lump, trunk: Secondary | ICD-10-CM | POA: Diagnosis not present

## 2017-09-15 DIAGNOSIS — I471 Supraventricular tachycardia: Secondary | ICD-10-CM | POA: Diagnosis present

## 2017-09-15 DIAGNOSIS — I6340 Cerebral infarction due to embolism of unspecified cerebral artery: Secondary | ICD-10-CM | POA: Diagnosis not present

## 2017-09-15 DIAGNOSIS — I161 Hypertensive emergency: Secondary | ICD-10-CM | POA: Diagnosis present

## 2017-09-15 DIAGNOSIS — I639 Cerebral infarction, unspecified: Secondary | ICD-10-CM | POA: Diagnosis not present

## 2017-09-15 DIAGNOSIS — I509 Heart failure, unspecified: Secondary | ICD-10-CM | POA: Diagnosis not present

## 2017-09-15 DIAGNOSIS — Z7901 Long term (current) use of anticoagulants: Secondary | ICD-10-CM | POA: Diagnosis not present

## 2017-09-15 DIAGNOSIS — E785 Hyperlipidemia, unspecified: Secondary | ICD-10-CM | POA: Diagnosis present

## 2017-09-15 DIAGNOSIS — D151 Benign neoplasm of heart: Secondary | ICD-10-CM | POA: Diagnosis present

## 2017-09-15 DIAGNOSIS — I5189 Other ill-defined heart diseases: Secondary | ICD-10-CM | POA: Diagnosis not present

## 2017-09-15 DIAGNOSIS — D696 Thrombocytopenia, unspecified: Secondary | ICD-10-CM | POA: Diagnosis present

## 2017-09-15 DIAGNOSIS — R7989 Other specified abnormal findings of blood chemistry: Secondary | ICD-10-CM

## 2017-09-15 DIAGNOSIS — J810 Acute pulmonary edema: Secondary | ICD-10-CM | POA: Diagnosis present

## 2017-09-15 DIAGNOSIS — R748 Abnormal levels of other serum enzymes: Secondary | ICD-10-CM | POA: Diagnosis not present

## 2017-09-15 DIAGNOSIS — R109 Unspecified abdominal pain: Secondary | ICD-10-CM

## 2017-09-15 DIAGNOSIS — N179 Acute kidney failure, unspecified: Secondary | ICD-10-CM

## 2017-09-15 DIAGNOSIS — I214 Non-ST elevation (NSTEMI) myocardial infarction: Secondary | ICD-10-CM

## 2017-09-15 DIAGNOSIS — I5030 Unspecified diastolic (congestive) heart failure: Secondary | ICD-10-CM | POA: Diagnosis not present

## 2017-09-15 DIAGNOSIS — I2129 ST elevation (STEMI) myocardial infarction involving other sites: Secondary | ICD-10-CM | POA: Diagnosis not present

## 2017-09-15 DIAGNOSIS — I519 Heart disease, unspecified: Secondary | ICD-10-CM | POA: Diagnosis not present

## 2017-09-15 DIAGNOSIS — R29704 NIHSS score 4: Secondary | ICD-10-CM | POA: Diagnosis not present

## 2017-09-15 DIAGNOSIS — T465X6A Underdosing of other antihypertensive drugs, initial encounter: Secondary | ICD-10-CM | POA: Diagnosis present

## 2017-09-15 DIAGNOSIS — R778 Other specified abnormalities of plasma proteins: Secondary | ICD-10-CM | POA: Diagnosis present

## 2017-09-15 LAB — COMPREHENSIVE METABOLIC PANEL
ALT: 166 U/L — ABNORMAL HIGH (ref 14–54)
ANION GAP: 19 — AB (ref 5–15)
AST: 116 U/L — AB (ref 15–41)
Albumin: 3.4 g/dL — ABNORMAL LOW (ref 3.5–5.0)
Alkaline Phosphatase: 106 U/L (ref 38–126)
BILIRUBIN TOTAL: 1.9 mg/dL — AB (ref 0.3–1.2)
BUN: 30 mg/dL — AB (ref 6–20)
CHLORIDE: 97 mmol/L — AB (ref 101–111)
CO2: 14 mmol/L — ABNORMAL LOW (ref 22–32)
Calcium: 8.6 mg/dL — ABNORMAL LOW (ref 8.9–10.3)
Creatinine, Ser: 1.64 mg/dL — ABNORMAL HIGH (ref 0.44–1.00)
GFR, EST AFRICAN AMERICAN: 37 mL/min — AB (ref >60)
GFR, EST NON AFRICAN AMERICAN: 32 mL/min — AB (ref >60)
Glucose, Bld: 116 mg/dL — ABNORMAL HIGH (ref 65–99)
POTASSIUM: 4.2 mmol/L (ref 3.5–5.1)
Sodium: 130 mmol/L — ABNORMAL LOW (ref 135–145)
TOTAL PROTEIN: 6.9 g/dL (ref 6.5–8.1)

## 2017-09-15 LAB — I-STAT TROPONIN, ED: TROPONIN I, POC: 5.5 ng/mL — AB (ref 0.00–0.08)

## 2017-09-15 LAB — CBC WITH DIFFERENTIAL/PLATELET
BASOS ABS: 0 10*3/uL (ref 0.0–0.1)
Basophils Relative: 0 %
Eosinophils Absolute: 0 10*3/uL (ref 0.0–0.7)
Eosinophils Relative: 0 %
HEMATOCRIT: 38.4 % (ref 36.0–46.0)
HEMOGLOBIN: 11.2 g/dL — AB (ref 12.0–15.0)
LYMPHS ABS: 2.5 10*3/uL (ref 0.7–4.0)
Lymphocytes Relative: 20 %
MCH: 23.9 pg — AB (ref 26.0–34.0)
MCHC: 29.2 g/dL — ABNORMAL LOW (ref 30.0–36.0)
MCV: 81.9 fL (ref 78.0–100.0)
Monocytes Absolute: 0.7 10*3/uL (ref 0.1–1.0)
Monocytes Relative: 6 %
NEUTROS ABS: 9.1 10*3/uL — AB (ref 1.7–7.7)
NEUTROS PCT: 74 %
PLATELETS: 166 10*3/uL (ref 150–400)
RBC: 4.69 MIL/uL (ref 3.87–5.11)
RDW: 16.3 % — ABNORMAL HIGH (ref 11.5–15.5)
WBC: 12.3 10*3/uL — AB (ref 4.0–10.5)

## 2017-09-15 LAB — I-STAT CHEM 8, ED
BUN: 33 mg/dL — AB (ref 6–20)
CALCIUM ION: 0.95 mmol/L — AB (ref 1.15–1.40)
CHLORIDE: 100 mmol/L — AB (ref 101–111)
Creatinine, Ser: 1.5 mg/dL — ABNORMAL HIGH (ref 0.44–1.00)
GLUCOSE: 120 mg/dL — AB (ref 65–99)
HCT: 43 % (ref 36.0–46.0)
Hemoglobin: 14.6 g/dL (ref 12.0–15.0)
POTASSIUM: 4.3 mmol/L (ref 3.5–5.1)
Sodium: 132 mmol/L — ABNORMAL LOW (ref 135–145)
TCO2: 17 mmol/L — ABNORMAL LOW (ref 22–32)

## 2017-09-15 LAB — I-STAT ARTERIAL BLOOD GAS, ED
Acid-base deficit: 9 mmol/L — ABNORMAL HIGH (ref 0.0–2.0)
Bicarbonate: 12.8 mmol/L — ABNORMAL LOW (ref 20.0–28.0)
O2 Saturation: 99 %
PCO2 ART: 19 mmHg — AB (ref 32.0–48.0)
PH ART: 7.433 (ref 7.350–7.450)
PO2 ART: 109 mmHg — AB (ref 83.0–108.0)
Patient temperature: 97.5
TCO2: 13 mmol/L — AB (ref 22–32)

## 2017-09-15 LAB — I-STAT CG4 LACTIC ACID, ED
LACTIC ACID, VENOUS: 9.59 mmol/L — AB (ref 0.5–1.9)
Lactic Acid, Venous: 9.45 mmol/L (ref 0.5–1.9)
Lactic Acid, Venous: 10.72 mmol/L (ref 0.5–1.9)

## 2017-09-15 LAB — BRAIN NATRIURETIC PEPTIDE: B Natriuretic Peptide: >4500 pg/mL — ABNORMAL HIGH (ref 0.0–100.0)

## 2017-09-15 MED ORDER — ONDANSETRON HCL 4 MG/2ML IJ SOLN
4.0000 mg | Freq: Four times a day (QID) | INTRAMUSCULAR | Status: DC | PRN
  Filled 2017-09-15: qty 2

## 2017-09-15 MED ORDER — SODIUM CHLORIDE 0.9 % IV BOLUS (SEPSIS): 1500.0000 mL | Freq: Once | INTRAVENOUS | Status: DC

## 2017-09-15 MED ORDER — VANCOMYCIN HCL IN DEXTROSE 750-5 MG/150ML-% IV SOLN: 750.0000 mg | INTRAVENOUS | Status: DC

## 2017-09-15 MED ORDER — PANTOPRAZOLE SODIUM 40 MG IV SOLR
40.0000 mg | Freq: Every day | INTRAVENOUS | Status: DC
  Administered 2017-09-16: 40 mg via INTRAVENOUS

## 2017-09-15 MED ORDER — HEPARIN BOLUS VIA INFUSION
3600.0000 [IU] | Freq: Once | INTRAVENOUS | Status: AC
  Administered 2017-09-16: 3600 [IU] via INTRAVENOUS
  Filled 2017-09-15: qty 3600

## 2017-09-15 MED ORDER — FUROSEMIDE 10 MG/ML IJ SOLN
80.0000 mg | Freq: Once | INTRAMUSCULAR | Status: AC
  Administered 2017-09-15: 80 mg via INTRAVENOUS
  Filled 2017-09-15: qty 8

## 2017-09-15 MED ORDER — HYDRALAZINE HCL 20 MG/ML IJ SOLN
10.0000 mg | INTRAMUSCULAR | Status: DC | PRN
  Administered 2017-09-15: 10 mg via INTRAVENOUS
  Filled 2017-09-15: qty 1

## 2017-09-15 MED ORDER — PROCHLORPERAZINE EDISYLATE 5 MG/ML IJ SOLN
10.0000 mg | INTRAMUSCULAR | Status: DC | PRN
  Administered 2017-09-16: 10 mg via INTRAVENOUS
  Filled 2017-09-15 (×2): qty 2

## 2017-09-15 MED ORDER — PIPERACILLIN-TAZOBACTAM 3.375 G IVPB: 3.3750 g | Freq: Three times a day (TID) | INTRAVENOUS | Status: DC

## 2017-09-15 MED ORDER — HEPARIN (PORCINE) IN NACL 100-0.45 UNIT/ML-% IJ SOLN
1050.0000 [IU]/h | INTRAMUSCULAR | Status: DC
  Administered 2017-09-16: 850 [IU]/h via INTRAVENOUS
  Filled 2017-09-15: qty 250

## 2017-09-15 MED ORDER — SODIUM BICARBONATE 8.4 % IV SOLN
50.0000 meq | Freq: Once | INTRAVENOUS | Status: AC
  Administered 2017-09-15: 50 meq via INTRAVENOUS
  Filled 2017-09-15: qty 50

## 2017-09-15 MED ORDER — SODIUM CHLORIDE 0.9 % IV SOLN
250.0000 mL | INTRAVENOUS | Status: DC | PRN
Start: 2017-09-15 — End: 2017-09-24

## 2017-09-15 MED ORDER — PIPERACILLIN-TAZOBACTAM 3.375 G IVPB 30 MIN
3.3750 g | Freq: Once | INTRAVENOUS | Status: AC
  Administered 2017-09-15: 3.375 g via INTRAVENOUS
  Filled 2017-09-15: qty 50

## 2017-09-15 MED ORDER — ONDANSETRON HCL 4 MG/2ML IJ SOLN
4.0000 mg | Freq: Four times a day (QID) | INTRAMUSCULAR | Status: DC | PRN
  Administered 2017-09-15 – 2017-09-20 (×6): 4 mg via INTRAVENOUS
  Filled 2017-09-15 (×5): qty 2

## 2017-09-15 MED ORDER — ACETAMINOPHEN 325 MG PO TABS
650.0000 mg | ORAL_TABLET | ORAL | Status: DC | PRN
  Administered 2017-09-16: 650 mg via ORAL
  Filled 2017-09-15 (×2): qty 2

## 2017-09-15 MED ORDER — DILTIAZEM HCL-DEXTROSE 100-5 MG/100ML-% IV SOLN (PREMIX): 5.0000 mg/h | Freq: Once | INTRAVENOUS | Status: DC

## 2017-09-15 MED ORDER — SODIUM CHLORIDE 0.9 % IV BOLUS (SEPSIS)
500.0000 mL | Freq: Once | INTRAVENOUS | Status: AC
  Administered 2017-09-15: 500 mL via INTRAVENOUS

## 2017-09-15 MED ORDER — VANCOMYCIN HCL IN DEXTROSE 1-5 GM/200ML-% IV SOLN
1000.0000 mg | Freq: Once | INTRAVENOUS | Status: AC
  Administered 2017-09-15: 1000 mg via INTRAVENOUS
  Filled 2017-09-15: qty 200

## 2017-09-15 NOTE — Progress Notes (Signed)
ANTICOAGULATION CONSULT NOTE - Initial Consult  Pharmacy Consult for heparin Indication: atrial fibrillation  Allergies  Allergen Reactions  . Codeine Anaphylaxis  . Tramadol Anaphylaxis    No hydrocodone or anything connected No hydrocodone or anything connected   . Clarithromycin Nausea And Vomiting  . Citalopram Other (See Comments)    Caused more depression   . Fish Allergy Itching    ears  . Other     Patient says that all narcotics give her anaphylaxis.   . Wellbutrin [Bupropion] Other (See Comments)    "could not speak a full sentence for a whole year"    Patient Measurements: Height: 5\' 7"  (170.2 cm) Weight: 130 lb (59 kg) IBW/kg (Calculated) : 61.6 Heparin Dosing Weight: 60 kg  Vital Signs: Temp: 99.1 F (37.3 C) (03/23 2043) Temp Source: Rectal (03/23 2043) BP: 163/96 (03/23 2245) Pulse Rate: 100 (03/23 2245)  Labs: Recent Labs    09/15/17 1941 09/15/17 2005  HGB 14.6 11.2*  HCT 43.0 38.4  PLT  --  166  CREATININE 1.50* 1.64*    Medical History: Past Medical History:  Diagnosis Date  . Renal artery stenosis ([place])   . Secondary hypertension   . Tobacco abuse     Assessment: 62 yo female with AFib but is not on anticoagulation prior to admission. Starting heparin for AFib in meantime. Hgb 11.2, plts wnl, SCr up to 1.6, eCrCl ~ 30 ml/min.    Goal of Therapy:  Heparin level 0.3-0.7 units/ml Monitor platelets by anticoagulation protocol: Yes    Plan:  -Heparin bolus 3600 units x1 then 850 units/hr -Daily HL, CBC -Level in 6 hours   Harvel Quale 09/15/2017,11:32 PM

## 2017-09-15 NOTE — Progress Notes (Signed)
Pharmacy Antibiotic Note  Chelle Madelene Kaatz is a 62 y.o. female admitted on 09/15/2017 with sepsis.    Plan: Zosyn 3.375 gm q8h Vanc 1 g x 1 then 750 q24h Monitor renal fx cx vt prn  Height: 5\' 7"  (170.2 cm) Weight: 130 lb (59 kg) IBW/kg (Calculated) : 61.6  Temp (24hrs), Avg:97.5 F (36.4 C), Min:97.5 F (36.4 C), Max:97.5 F (36.4 C)  Recent Labs  Lab 09/15/17 1941  CREATININE 1.50*  LATICACIDVEN 9.45*    Estimated Creatinine Clearance: 35.8 mL/min (A) (by C-G formula based on SCr of 1.5 mg/dL (H)).    Allergies  Allergen Reactions  . Codeine Anaphylaxis  . Citalopram Other (See Comments)    Caused more depression   . Fish Allergy Itching    ears  . Other     Patient says that all narcotics give her anaphylaxis.   . Wellbutrin [Bupropion] Other (See Comments)    "could not speak a full sentence for a whole year"   Levester Fresh, PharmD, BCPS, BCCCP Clinical Pharmacist Clinical phone for 09/15/2017 from 7a-3:30p: [phone] If after 3:30p, please call main pharmacy at: [phone] 09/15/2017 7:52 PM

## 2017-09-15 NOTE — ED Triage Notes (Signed)
Per ems pt called due to sob and hearty racing. Pt stated she felt like her heart was going to explode.. Complaining of right shoulder pain. Has not been taking blood pressure medication for a few days. Hx A-FIB, WAS GIVEN 324 ASPIRIN FROM EMS, 500 BOLUS, 4MG  ZOFRAN, 130/100 177 CBG, 130 HR, LUNGS CLEAR.

## 2017-09-15 NOTE — ED Notes (Signed)
Nurse starting IV and will get 1st set blood culture.

## 2017-09-15 NOTE — ED Notes (Signed)
Patient is on BiPAP per MD verbal orders. Take an ABG on patient at 2000 reveal Compensated Metabolic Acidosis. On arrival to room 2158 noted some shallow rapid respirations. Patient is tachypeic with a RR of 32-34. Pt is maintaining her SATs on 3L of oxygen 100%.  Patient is a current smoker and has history of A-fib, emphysema, and possible heart failure referring to her BNP being so elevated. Pt sister is in the room and stated that she doesn't take her meds like she should.  Pt also has a critical Lactic Acid of 9.59 at 2013 and a repeat Lactic showing 10.72 at 2153. CXR shows infiltrates in the lung bases with developing ARDS versus pneumonia. Pt is on very minimal settings on BiPAP due to her having great volumes and minute ventilation. Her MV is elevated. Not advise to over ventilate the patient due to her pH being compensated. CCM is at the bedside at this time to assess patient

## 2017-09-15 NOTE — Progress Notes (Signed)
ABG collected  

## 2017-09-15 NOTE — ED Notes (Signed)
Writer notified EDP of abnormal I-stat lactic result 

## 2017-09-15 NOTE — H&P (Addendum)
PULMONARY / CRITICAL CARE MEDICINE   Name: Glenda Johnson MRN: [id number] DOB: September 15, 1955    ADMISSION DATE:  09/15/2017 CONSULTATION DATE:  09/15/2017  REFERRING MD:  EDP , Dr. Tamera Punt  CHIEF COMPLAINT:  Sob and abd pain/n/v   HISTORY OF PRESENT ILLNESS:   62 yo female smoker , with CHF, A Fib (refused [place])   HTN, CAD , Hep C  and Emphysema   presented to ER on 09/15/17 with severe dyspnea for last 2 days along with n/v. Was recently admitted to [facility] for chest pain . CT chest was neg for PE , +severe emphysema .  Had Left heart cath with non obstructive CAD . On arrival found to have bilateral infiltrates , increased wob , BNP >4500, Lactate 9  and elevated troponin. Scr is elevated .  She denies cough, congestion , fever, rash . She was given 1 L NS bolus  And placed on BIPAP  In ER .  She has had several admissions over last couple of years. According to sister she is noncompliant with medications.  She takes ibuprofen on regular basis .  She was given trial off BIPAP was unable to tolerate with increased dyspnea and work of breathing.     PAST MEDICAL HISTORY :  She  has a past medical history of Renal artery stenosis ([place]), Secondary hypertension, and Tobacco abuse.  PAST SURGICAL HISTORY: She  has a past surgical history that includes Back surgery; stent renal artery; and Cardiac catheterization (N/A, 05/25/2016).  Allergies  Allergen Reactions  . Codeine Anaphylaxis  . Tramadol Anaphylaxis    No hydrocodone or anything connected No hydrocodone or anything connected   . Clarithromycin Nausea And Vomiting  . Citalopram Other (See Comments)    Caused more depression   . Fish Allergy Itching    ears  . Other     Patient says that all narcotics give her anaphylaxis.   . Wellbutrin [Bupropion] Other (See Comments)    "could not speak a full sentence for a whole year"    No current facility-administered medications on file prior to encounter.    Current  Outpatient Medications on File Prior to Encounter  Medication Sig  . albuterol (PROVENTIL HFA;VENTOLIN HFA) 108 (90 Base) MCG/ACT inhaler Inhale 2 puffs into the lungs every 4 (four) hours as needed.  [name] aspirin EC 81 MG tablet Take 1 tablet (81 mg total) by mouth daily.  . diphenhydrAMINE (BENADRYL) 25 MG tablet Take 25 mg by mouth as needed for itching.  . furosemide (LASIX) 40 MG tablet Take 40 mg by mouth daily as needed.  . hydrALAZINE (APRESOLINE) 50 MG tablet Take 50 mg by mouth 2 (two) times daily.  [name] ibuprofen (ADVIL,MOTRIN) 200 MG tablet Take 600-800 mg by mouth every 6 (six) hours as needed for moderate pain.  [name] LORazepam (ATIVAN) 0.5 MG tablet Take 0.5 mg by mouth every 6 (six) hours as needed for anxiety.   . ondansetron (ZOFRAN) 4 MG tablet Take 4 mg by mouth every 8 (eight) hours as needed for nausea.  . Potassium Chloride ER 20 MEQ TBCR Take 20 mEq by mouth daily.  [name] amLODipine (NORVASC) 10 MG tablet Take 1 tablet (10 mg total) by mouth daily. (Patient not taking: Reported on 09/15/2017)  . nicotine (NICODERM CQ - DOSED IN MG/24 HOURS) 14 mg/24hr patch Place 1 patch (14 mg total) onto the skin daily. (Patient not taking: Reported on 09/15/2017)  . predniSONE (STERAPRED UNI-PAK 21 TAB) 10 MG (  21) TBPK tablet Take 10 mg by mouth as directed.    FAMILY HISTORY:  Her indicated that her mother is deceased. She indicated that her father is deceased. She indicated that her maternal grandmother is deceased. She indicated that her maternal grandfather is deceased. She indicated that her paternal grandmother is deceased. She indicated that her paternal grandfather is deceased.   SOCIAL HISTORY: She  reports that she has been smoking.  She has never used smokeless tobacco. She reports that she does not drink alcohol or use drugs.  REVIEW OF SYSTEMS:   .Constitutional:   No  weight loss, night sweats,  Fevers, chills, +fatigue, or  lassitude.  HEENT:   No headaches,  Difficulty swallowing,   Tooth/dental problems, or  Sore throat,                No sneezing, itching, ear ache, nasal congestion, post nasal drip,   CV:  No chest pain,  Orthopnea, PND, +swelling in lower extremities, No  anasarca, dizziness, palpitations, syncope.   GI + heartburn,   abdominal pain, nausea, vomiting,  No diarrhea, change in bowel habits,   bloody stools.   Resp:++shortness of breath with exertion or at rest.   No excess mucus, no productive cough,  No non-productive cough,  No coughing up of blood.  No change in color of mucus.  No wheezing.  No chest wall deformity  Skin: no rash or lesions.  GU: no dysuria, change in color of urine, no urgency or frequency.  No flank pain, no hematuria   MS:  No joint pain or swelling.  No decreased range of motion.  No back pain.  Psych:  No change in mood or affect. No depression or anxiety.  No memory loss.       SUBJECTIVE:  2 days of increased sob . N/v x 2 days  Has been sick for 2 weeks , worse last 2 days   VITAL SIGNS: BP (!) 174/103   Pulse (!) 119   Temp 99.1 F (37.3 C) (Rectal)   Resp (!) 32   Ht 5\' 7"  (1.702 m)   Wt 130 lb (59 kg)   SpO2 99%   BMI 20.36 kg/m   HEMODYNAMICS:    VENTILATOR SETTINGS:    INTAKE / OUTPUT: No intake/output data recorded.  PHYSICAL EXAMINATION: General:  Frail elderly female on BIPAP  Neuro:  Alert , f/c ,  HEENT:  AT/[place] , dry oral mucosa  Cardiovascular:  ST , no m/r/g  Lungs:  BB crackles  Abdomen:  Soft , + tender mid to upper abd , BS +  Musculoskeletal:  Intact, MAEx4  Skin:  Intact , no rash   LABS:  BMET Recent Labs  Lab 09/15/17 1941 09/15/17 2005  NA 132* 130*  K 4.3 4.2  CL 100* 97*  CO2  --  14*  BUN 33* 30*  CREATININE 1.50* 1.64*  GLUCOSE 120* 116*    Electrolytes Recent Labs  Lab 09/15/17 2005  CALCIUM 8.6*    CBC Recent Labs  Lab 09/15/17 1941 09/15/17 2005  WBC  --  12.3*  HGB 14.6 11.2*  HCT 43.0 38.4  PLT  --  166    Coag's No results  for input(s): APTT, INR in the last 168 hours.  Sepsis Markers Recent Labs  Lab 09/15/17 1941 09/15/17 2013 09/15/17 2153  LATICACIDVEN 9.45* 9.59* 10.72*    ABG Recent Labs  Lab 09/15/17 2013  PHART 7.433  PCO2ART 19.0*  PO2ART 109.0*    Liver Enzymes Recent Labs  Lab 09/15/17 2005  AST 116*  ALT 166*  ALKPHOS 106  BILITOT 1.9*  ALBUMIN 3.4*    Cardiac Enzymes No results for input(s): TROPONINI, PROBNP in the last 168 hours.  Glucose No results for input(s): GLUCAP in the last 168 hours.  Imaging Dg Chest Port 1 View  Result Date: 09/15/2017 CLINICAL DATA:  Shortness of breath. EXAM: PORTABLE CHEST 1 VIEW COMPARISON:  May 23, 2016 FINDINGS: Infiltrates are seen in the lung bases, similar in appearance to more remote studies. The heart size borderline. The hila are normal. No pneumothorax. No nodules or masses. IMPRESSION: Bibasilar infiltrates could represent developing ARDS versus pneumonia. Recommend clinical correlation. Recommend follow-up to resolution. Electronically Signed   By: Dorise Bullion III M.D   On: 09/15/2017 19:57     STUDIES:  3/23 Echo >>  CULTURES: 3/23 BC >>  ANTIBIOTICS: 3/23 Vanc >> 3/23 Zosyn >>  SIGNIFICANT EVENTS: 3/23 admit from ER   LINES/TUBES:   DISCUSSION: 62 yo WF smoker with CHF  presents with acute respiratory distress with bilateral infiltrates on CXR , elevated BNP suspicious for pulmonary edema .   ASSESSMENT / PLAN:  PULMONARY A: Acute Respiratory Failure with Bilateral Infiltrates -suspected Pulmonary edema  Emphysema  P:   Diuresis as able  BIPAP support As needed   Albuterol Neb As needed   Check cxr in am    CARDIOVASCULAR A:  CHF -Systolic /Diastolic  HTN  A Fib   P:  Lasix 80mg  IV x 1  Hep Drip per cards  Echo is pending  Trend lactate  IV Hydralazine    RENAL A:   Acute Kidney Failure most likely from ATN  P:   HCO3 x 1 amp  Avoid nephrotoxic meds.     GASTROINTESTINAL A:   abd pain w/ n/v ? NSAID Gastritis  Transaminitis  HEP C  P:   Abd [place]  Tr LFT  Check lipase  IV PPI   HEMATOLOGIC A:   Mild anemia  P:  Trend h/h  Hep Drip   INFECTIOUS A:   Bilateral Infiltrates /Elevated lactate  P:   Continue IV abx with Zosyn /Vanc for now, narrow as able  Check PCT  Trend temp/WBC  Follow cx data   ENDOCRINE A:    No acute issues  P:   Trend BS on chem , if elevated will add SSI   NEUROLOGIC A:   No acute issues  P:   Avoid sedating rx  Check UDS   FAMILY  - Updates: Sister updated at bedside in ER 3/23   - Inter-disciplinary family meet or Palliative Care meeting due by:  09/22/17    .Rexene Edison NP -C  Pulmonary and [place] Pager: [phone]  09/15/2017, 10:12 PM

## 2017-09-15 NOTE — Consult Note (Signed)
Cardiology Consultation:   Patient ID: Glenda Johnson; [id number]; Oct 19, 1955   Admit date: 09/15/2017 Date of Consult: 09/15/2017  Primary Care Provider: Paris Lore, MD Primary Cardiologist: Dr. Angelena Form    Patient Profile:   Glenda Johnson is a 62 y.o. female with a hx of nonobstructive CAD, HFrEF, AF, COPD, RAS, HTN, who presented to the ED with worsening dyspnea. She is being seen today for the evaluation of elevated troponin and elevated BNP at the request of Dr. Tamera Punt.  History of Present Illness:   Glenda Johnson is a 62 y.o. female with a hx of nonobstructive CAD, HFrEF, AF, COPD, RAS, HTN, who presented to the ED with worsening dyspnea. She is being seen today for the evaluation of elevated troponin and elevated BNP at the request of Dr. Tamera Punt.  The patient has a history of non-obstructive CAD and heart failure (EF on last echocardiogram 45-50%). She was hospitalized in 2017 at [facility] for sepsis and was followed by cardiology for elevated troponin and abnormal echocardiogram. LHC showed nonobstructive CAD. He was last seen in cardiology clinic in 05/2016 for hospital follow up, at which time she was doing well, although she was hypertensive.   She was recently hospitalized at [facility] on 09/02/17 after presenting with nausea and vomiting. During this hospitalization, she had an elevated troponin and was evaluated by cardiology. She underwent repeat LHC that showed non-obstructive CAD. She was hypertensive over her hospital course, and hydralazine was added to her medication regimen.  The patient presents to the ED today with worsening dyspnea. She has also had cough and body aches. In the ED, pt hypertensive with SBP 160-180s with tachycardia to 140s that improved to 110s, with normal O2 saturations. Labs were notable for lactate of 9.6, troponin of 5.5, BNP 4500. Creatinine 1.64 (baseline 1.1-1.4), WBC 12.3. ECG showed tachycardia (likely sinus tachycardia with  PVCs). CXR showed bilateral infiltrates. She was given IVF due to concern for possible sepsis. Admission is being coordinated with MICU, but cardiology is consulted for further management.   On my evaluation, the patient is hypertensive with SBP in the 190s with HR 110-140s. She is significantly dyspneic in the ED and intermittently on BiPAP. History is gathered from sister present. The patient has reportedly had ongoing dyspnea over the past year. She has also had persistent abdominal pain that the patient's sister feels has not been adequately worked up. The patient describes chest pain several days ago but has not had chest pain recently. She has had minimal LE edema. She denies other complaints. She has been non-complaint with her medications (including antihypertensives) due to concern of interaction with her HCV treatment. Bedside TTE was attempted using ED ultrasound. Study was very limited due to patient positioning and chest wall/lung interference; however, the study revealed LVH with probable moderate to severe LV systolic dysfunction. IVC was distended and non-collapsible (while on BiPAP).   Past Medical History:  Diagnosis Date  . Renal artery stenosis ([place])   . Secondary hypertension   . Tobacco abuse     Past Surgical History:  Procedure Laterality Date  . BACK SURGERY    . CARDIAC CATHETERIZATION N/A 05/25/2016   Procedure: Left Heart Cath and Coronary Angiography;  Surgeon: Lorretta Harp, MD;  Location: [facility];  Service: Cardiovascular;  Laterality: N/A;  . stent renal artery       Home Medications:  Prior to Admission medications   Medication Sig Start Date End Date Taking? Authorizing Provider  albuterol (PROVENTIL HFA;VENTOLIN HFA) 108 (90 Base) MCG/ACT inhaler Inhale 2 puffs into the lungs every 4 (four) hours as needed. 09/10/17 09/10/18 Yes [provider]  aspirin EC 81 MG tablet Take 1 tablet (81 mg total) by mouth daily. 05/26/16  Yes Hongalgi,  Lenis Dickinson, MD  diphenhydrAMINE (BENADRYL) 25 MG tablet Take 25 mg by mouth as needed for itching.   Yes [provider]  furosemide (LASIX) 40 MG tablet Take 40 mg by mouth daily as needed. 03/08/17  Yes [provider]  hydrALAZINE (APRESOLINE) 50 MG tablet Take 50 mg by mouth 2 (two) times daily. 09/03/17  Yes [provider]  ibuprofen (ADVIL,MOTRIN) 200 MG tablet Take 600-800 mg by mouth every 6 (six) hours as needed for moderate pain.   Yes [provider]  LORazepam (ATIVAN) 0.5 MG tablet Take 0.5 mg by mouth every 6 (six) hours as needed for anxiety.  07/05/17  Yes [provider]  ondansetron (ZOFRAN) 4 MG tablet Take 4 mg by mouth every 8 (eight) hours as needed for nausea. 09/03/17  Yes [provider]  Potassium Chloride ER 20 MEQ TBCR Take 20 mEq by mouth daily. 03/05/17  Yes [provider]  amLODipine (NORVASC) 10 MG tablet Take 1 tablet (10 mg total) by mouth daily. Patient not taking: Reported on 09/15/2017 05/27/16   Modena Jansky, MD  nicotine (NICODERM CQ - DOSED IN MG/24 HOURS) 14 mg/24hr patch Place 1 patch (14 mg total) onto the skin daily. Patient not taking: Reported on 09/15/2017 05/27/16   Modena Jansky, MD  predniSONE (STERAPRED UNI-PAK 21 TAB) 10 MG (21) TBPK tablet Take 10 mg by mouth as directed. 09/10/17   [provider]    Inpatient Medications: Scheduled Meds: . pantoprazole (PROTONIX) IV  40 mg Intravenous QHS   Continuous Infusions: . sodium chloride     PRN Meds:   Allergies:    Allergies  Allergen Reactions  . Codeine Anaphylaxis  . Tramadol Anaphylaxis    No hydrocodone or anything connected No hydrocodone or anything connected   . Clarithromycin Nausea And Vomiting  . Citalopram Other (See Comments)    Caused more depression   . Fish Allergy Itching    ears  . Other     Patient says that all narcotics give her anaphylaxis.   . Wellbutrin [Bupropion] Other (See Comments)      "could not speak a full sentence for a whole year"    Social History:   Social History   Socioeconomic History  . Marital status: Legally Separated    Spouse name: Not on file  . Number of children: Not on file  . Years of education: Not on file  . Highest education level: Not on file  Occupational History  . Not on file  Social Needs  . Financial resource strain: Not on file  . Food insecurity:    Worry: Not on file    Inability: Not on file  . Transportation needs:    Medical: Not on file    Non-medical: Not on file  Tobacco Use  . Smoking status: Current Every Day Smoker  . Smokeless tobacco: Never Used  Substance and Sexual Activity  . Alcohol use: No  . Drug use: No  . Sexual activity: Not on file  Lifestyle  . Physical activity:    Days per week: Not on file    Minutes per session: Not on file  . Stress: Not on file  Relationships  .  Social connections:    Talks on phone: Not on file    Gets together: Not on file    Attends religious service: Not on file    Active member of club or organization: Not on file    Attends meetings of clubs or organizations: Not on file    Relationship status: Not on file  . Intimate partner violence:    Fear of current or ex partner: Not on file    Emotionally abused: Not on file    Physically abused: Not on file    Forced sexual activity: Not on file  Other Topics Concern  . Not on file  Social History Narrative  . Not on file    Family History:    Family History  Problem Relation Age of Onset  . Diabetes Maternal Grandmother      ROS:  Please see the history of present illness.  All other ROS reviewed and negative.     Physical Exam/Data:   Vitals:   09/15/17 2043 09/15/17 2156 09/15/17 2233 09/15/17 2245  BP:  (!) 174/103 (!) 187/129 (!) 163/96  Pulse:  (!) 119  100  Resp:  (!) 32  (!) 32  Temp: 99.1 F (37.3 C)     TempSrc: Rectal     SpO2:  99%  100%  Weight:      Height:       No intake or  output data in the 24 hours ending 09/15/17 2313 Filed Weights   09/15/17 1853  Weight: 59 kg (130 lb)   Body mass index is 20.36 kg/m.  General:  Thin, chronically ill appearing female in moderate respiratory distress HEENT: normal Lymph: no adenopathy Neck: EJ distended with difficult visualization of JVP Cardiac:  Tachycardic with distant heart sounds (while on BiPAP).  No apparent murmur Lungs:  Dyspneic with increased work of breathing. Coarse breath sounds bilaterally Abd: Moderate tenderness to epigastrium with palpation  Ext: Trace LE edema  Musculoskeletal:  No deformities  Skin: warm and dry  Neuro:  No focal abnormalities noted Psych:  Normal affect   EKG:  The EKG was personally reviewed and demonstrates:  Tachycardia to 130s with PVCs and nonspecific ST changes.  Telemetry:  Telemetry was personally reviewed and demonstrates:  Tachycardia to 110-140s.  Relevant CV Studies:  LHC 09/03/17 ([facility]): 10% LM, 10% Diag, 0% LCX, 10% OM, 10-20% RCA  LHC [facility] 04/2016: Mild non-obstructive CAD 60% D1, 30% RCA, LVEF 55%  TTE 01/2017 ([facility]): A complete portable two-dimensional transthoracic echocardiogram with color flow Doppler and Spectral Doppler was performed. The study was technically adequate. There is a large pleural effusion. The left ventricle is grossly normal size. There is mild concentric left ventricular hypertrophy. The left ventricular ejection fraction is mildly reduced (45-50%). There is moderate (2+) mitral regurgitation. There is mild diffuse hypokinesis of the left ventricle. Right ventricular systolic pressure is normal. The left atrium is mildly dilated. Unable to adequately determine diastolic dysfunction.   Laboratory Data:  Chemistry Recent Labs  Lab 09/15/17 1941 09/15/17 2005  NA 132* 130*  K 4.3 4.2  CL 100* 97*  CO2  --  14*  GLUCOSE 120* 116*  BUN 33* 30*  CREATININE 1.50* 1.64*  CALCIUM  --  8.6*  GFRNONAA  --  32*  GFRAA  --   37*  ANIONGAP  --  19*    Recent Labs  Lab 09/15/17 2005  PROT 6.9  ALBUMIN 3.4*  AST 116*  ALT 166*  ALKPHOS  106  BILITOT 1.9*   Hematology Recent Labs  Lab 09/15/17 1941 09/15/17 2005  WBC  --  12.3*  RBC  --  4.69  HGB 14.6 11.2*  HCT 43.0 38.4  MCV  --  81.9  MCH  --  23.9*  MCHC  --  29.2*  RDW  --  16.3*  PLT  --  166   Cardiac EnzymesNo results for input(s): TROPONINI in the last 168 hours.  Recent Labs  Lab 09/15/17 1906  TROPIPOC 5.50*    BNP Recent Labs  Lab 09/15/17 2006  BNP >4,500.0*    DDimer No results for input(s): DDIMER in the last 168 hours.  Radiology/Studies:  Dg Chest Port 1 View  Result Date: 09/15/2017 CLINICAL DATA:  Shortness of breath. EXAM: PORTABLE CHEST 1 VIEW COMPARISON:  May 23, 2016 FINDINGS: Infiltrates are seen in the lung bases, similar in appearance to more remote studies. The heart size borderline. The hila are normal. No pneumothorax. No nodules or masses. IMPRESSION: Bibasilar infiltrates could represent developing ARDS versus pneumonia. Recommend clinical correlation. Recommend follow-up to resolution. Electronically Signed   By: Dorise Bullion III M.D   On: 09/15/2017 19:57    Assessment and Plan:   Acute on chronic dyspnea Elevated troponin, BNP HFrEF CAD HTN The patient has known non-obstructive CAD and a history of mildly reduced HFrEF. She has had multiple hospitalizations during which she has had elevated troponin, and most recent LHC was performed this month and showed non-obstructive CAD. She presents to the ED this evening with worsening dyspnea requiring BiPAP. Workup is notable for significant HTN with elevated troponin with significantly elevated BNP lactate. CXR shows infiltrates that may be due to edema vs PNA vs ARDS. The cause of her dyspnea is not clear, but her history of severe HTN (including RAS with revascularization) with medication non-compliance and elevated BP make flash pulmonary edema and  volume overload highly likely. Her lactate seems to be disproportionately high, however, so some other insult (eg infectious) may be possible. Her BNP is also significantly elevated, and my limited bedside TTE shows systolic dysfunction that has worsened from her prior study last year. She is warm on exam and has significantly elevated BP which makes cardiogenic shock rather unlikely, although systemic hypoperfusion from her heart failure should be considered if her lactate remains elevated with control of other factors. Finally, regarding her elevated troponin, the patient's ECG is reasurring against significant ischemia, and she denies chest pain, which makes ACS event rather unlikely. Recent LHC showed non-obstructive CAD, which makes this lab finding more likely to be consistent with demand ischemia. At this time, she would benefit from BP control and diuresis with further workup and treatment of her presenting symptoms.  -Continue to work up and treat causes of suspected symptoms (flash pulmonary edema) with work up for other possible causes (infection, ARDS, possibly PE, etc) -Continue to trend lactate, troponin -Agree with diuresis and BP control -Empiric heparin gtt is reasonable given unclear cause symptoms until clinical presentation is better characterized -Would benefit from ASA and statin (previously refused) when safe to take PO -Formal TTE ordered.   AF The patient has a history of AF but has refused anticoagulation. ECG shows likely sinus tachycardia with PVCs.  -Would anticoagulate for empiric ACS as above  -Continue to monitor HR, anticipate improvement with treatment of above insults.    For questions or updates, please contact [place] Please consult [email or web address] for contact info under Cardiology/STEMI.   Signed,  Nila Nephew, MD  09/15/2017 11:13 PM

## 2017-09-15 NOTE — ED Notes (Signed)
Please call patients sister Arlyce Harman with any updates [phone]

## 2017-09-15 NOTE — ED Provider Notes (Signed)
[facility] DEPARTMENT Provider Note   CSN: [id number] Arrival date & time: 09/15/17  2423     History   Chief Complaint Chief Complaint  Patient presents with  . Shortness of Breath    HPI Glenda Johnson is a 62 y.o. female.  Patient is a 61 year old female with a history of anxiety, hepatitis C, paroxysmal atrial fibrillation, hyperlipidemia, HTN and coronary artery disease who presents with shortness of breath.  She was [facility] with an end STEMI and shortness of breath.  She had a cardiac catheterization on March 11 which showed nonobstructive coronary artery disease and medical management was recommended.  She states she has been short of breath since that time.  She has had a couple episodes of some bad coughing spells.  She does not report any productive cough.  No known fevers.  She states she had some chest pain earlier today but none currently.  She just says that she feels very bad and aches all over.  She does have some neck pain which she says hurts worse when she moves her head around.  She denies any recent injuries.  Per her review of records, she has been noncompliant with her medications in the past.  She states recently she has not been taking her blood pressure medicine because there was some confusion about which one she was supposed to be taking.  She is been recommended that she take anticoagulants for her paroxysmal atrial fibrillation but she refuses this.  She refuses to take her lipid medications.  Currently she cannot tell me exactly what medication she is taking other than her anxiety medication.  She normally does not wear oxygen at home.     Past Medical History:  Diagnosis Date  . Renal artery stenosis ([place])   . Secondary hypertension   . Tobacco abuse     Patient Active Problem List   Diagnosis Date Noted  . Flash pulmonary edema ([place]) 09/15/2017  . Community acquired  pneumonia   . Abnormal echocardiogram   . Hypoxia 05/22/2016  . Acute respiratory failure with hypoxia ([place]) 05/22/2016  . Acute respiratory failure ([place]) 05/22/2016  . Sepsis ([place]) 05/22/2016  . Elevated troponin 05/22/2016  . COPD (chronic obstructive pulmonary disease) ([place]) 05/22/2016  . AKI (acute kidney injury) ([place]) 05/22/2016  . Acute pulmonary edema [facility])     Past Surgical History:  Procedure Laterality Date  . BACK SURGERY    . CARDIAC CATHETERIZATION N/A 05/25/2016   Procedure: Left Heart Cath and Coronary Angiography;  Surgeon: Lorretta Harp, MD;  Location: [facility];  Service: Cardiovascular;  Laterality: N/A;  . stent renal artery       OB History   None      Home Medications    Prior to Admission medications   Medication Sig Start Date End Date Taking? Authorizing Provider  albuterol (PROVENTIL HFA;VENTOLIN HFA) 108 (90 Base) MCG/ACT inhaler Inhale 2 puffs into the lungs every 4 (four) hours as needed. 09/10/17 09/10/18 Yes [provider]  aspirin EC 81 MG tablet Take 1 tablet (81 mg total) by mouth daily. 05/26/16  Yes Hongalgi, Lenis Dickinson, MD  diphenhydrAMINE (BENADRYL) 25 MG tablet Take 25 mg by mouth as needed for itching.   Yes [provider]  furosemide (LASIX) 40 MG tablet Take 40 mg by mouth daily as needed. 03/08/17  Yes [provider]  hydrALAZINE (APRESOLINE) 50 MG tablet Take  50 mg by mouth 2 (two) times daily. 09/03/17  Yes [provider]  ibuprofen (ADVIL,MOTRIN) 200 MG tablet Take 600-800 mg by mouth every 6 (six) hours as needed for moderate pain.   Yes [provider]  LORazepam (ATIVAN) 0.5 MG tablet Take 0.5 mg by mouth every 6 (six) hours as needed for anxiety.  07/05/17  Yes [provider]  ondansetron (ZOFRAN) 4 MG tablet Take 4 mg by mouth every 8 (eight) hours as needed for nausea. 09/03/17  Yes [provider]  Potassium Chloride ER 20 MEQ TBCR Take 20 mEq by mouth  daily. 03/05/17  Yes [provider]  amLODipine (NORVASC) 10 MG tablet Take 1 tablet (10 mg total) by mouth daily. Patient not taking: Reported on 09/15/2017 05/27/16   Modena Jansky, MD  nicotine (NICODERM CQ - DOSED IN MG/24 HOURS) 14 mg/24hr patch Place 1 patch (14 mg total) onto the skin daily. Patient not taking: Reported on 09/15/2017 05/27/16   Modena Jansky, MD  predniSONE (STERAPRED UNI-PAK 21 TAB) 10 MG (21) TBPK tablet Take 10 mg by mouth as directed. 09/10/17   [provider]    Family History Family History  Problem Relation Age of Onset  . Diabetes Maternal Grandmother     Social History Social History   Tobacco Use  . Smoking status: Current Every Day Smoker  . Smokeless tobacco: Never Used  Substance Use Topics  . Alcohol use: No  . Drug use: No     Allergies   Codeine; Tramadol; Clarithromycin; Citalopram; Fish allergy; Other; and Wellbutrin [bupropion]   Review of Systems Review of Systems  Constitutional: Positive for fatigue. Negative for chills, diaphoresis and fever.  HENT: Negative for congestion, rhinorrhea and sneezing.   Eyes: Negative.   Respiratory: Positive for cough, chest tightness and shortness of breath.   Cardiovascular: Positive for chest pain. Negative for leg swelling.  Gastrointestinal: Positive for nausea and vomiting. Negative for abdominal pain, blood in stool and diarrhea.  Genitourinary: Negative for difficulty urinating, flank pain, frequency and hematuria.  Musculoskeletal: Positive for neck pain. Negative for arthralgias and back pain.  Skin: Negative for rash.  Neurological: Negative for dizziness, speech difficulty, weakness, numbness and headaches.     Physical Exam Updated Vital Signs BP (!) 187/129   Pulse (!) 119   Temp 99.1 F (37.3 C) (Rectal)   Resp (!) 32   Ht 5\' 7"  (1.702 m)   Wt 59 kg (130 lb)   SpO2 99%   BMI 20.36 kg/m   Physical Exam  Constitutional: She is oriented to person,  place, and time. She appears well-developed and well-nourished.  HENT:  Head: Normocephalic and atraumatic.  Eyes: Pupils are equal, round, and reactive to light.  Neck: Normal range of motion. Neck supple.  Cardiovascular: Regular rhythm and normal heart sounds. Tachycardia present.  Pulmonary/Chest: Tachypnea noted. She is in respiratory distress. She has no wheezes. She has rales. She exhibits no tenderness.  Diminished breath sounds on the right  Abdominal: Soft. Bowel sounds are normal. There is no tenderness. There is no rebound and no guarding.  Musculoskeletal: Normal range of motion. She exhibits no edema.  Lymphadenopathy:    She has no cervical adenopathy.  Neurological: She is alert and oriented to person, place, and time.  Skin: Skin is warm and dry. No rash noted.  Psychiatric: She has a normal mood and affect.     ED Treatments / Results  Labs (all labs ordered are  listed, but only abnormal results are displayed) Labs Reviewed  COMPREHENSIVE METABOLIC PANEL - Abnormal; Notable for the following components:      Result Value   Sodium 130 (*)    Chloride 97 (*)    CO2 14 (*)    Glucose, Bld 116 (*)    BUN 30 (*)    Creatinine, Ser 1.64 (*)    Calcium 8.6 (*)    Albumin 3.4 (*)    AST 116 (*)    ALT 166 (*)    Total Bilirubin 1.9 (*)    GFR calc non Af Amer 32 (*)    GFR calc Af Amer 37 (*)    Anion gap 19 (*)    All other components within normal limits  CBC WITH DIFFERENTIAL/PLATELET - Abnormal; Notable for the following components:   WBC 12.3 (*)    Hemoglobin 11.2 (*)    MCH 23.9 (*)    MCHC 29.2 (*)    RDW 16.3 (*)    Neutro Abs 9.1 (*)    All other components within normal limits  BRAIN NATRIURETIC PEPTIDE - Abnormal; Notable for the following components:   B Natriuretic Peptide >4,500.0 (*)    All other components within normal limits  I-STAT TROPONIN, ED - Abnormal; Notable for the following components:   Troponin i, poc 5.50 (*)    All other  components within normal limits  I-STAT CHEM 8, ED - Abnormal; Notable for the following components:   Sodium 132 (*)    Chloride 100 (*)    BUN 33 (*)    Creatinine, Ser 1.50 (*)    Glucose, Bld 120 (*)    Calcium, Ion 0.95 (*)    TCO2 17 (*)    All other components within normal limits  I-STAT CG4 LACTIC ACID, ED - Abnormal; Notable for the following components:   Lactic Acid, Venous 9.45 (*)    All other components within normal limits  I-STAT CG4 LACTIC ACID, ED - Abnormal; Notable for the following components:   Lactic Acid, Venous 9.59 (*)    All other components within normal limits  I-STAT ARTERIAL BLOOD GAS, ED - Abnormal; Notable for the following components:   pCO2 arterial 19.0 (*)    pO2, Arterial 109.0 (*)    Bicarbonate 12.8 (*)    TCO2 13 (*)    Acid-base deficit 9.0 (*)    All other components within normal limits  I-STAT CG4 LACTIC ACID, ED - Abnormal; Notable for the following components:   Lactic Acid, Venous 10.72 (*)    All other components within normal limits  CULTURE, BLOOD (ROUTINE X 2)  CULTURE, BLOOD (ROUTINE X 2)  URINALYSIS, ROUTINE W REFLEX MICROSCOPIC  BLOOD GAS, ARTERIAL  RAPID URINE DRUG SCREEN, HOSP PERFORMED  LACTIC ACID, PLASMA  LACTIC ACID, PLASMA  LACTIC ACID, PLASMA  LACTIC ACID, PLASMA  TROPONIN I  TROPONIN I  TROPONIN I  PROCALCITONIN  PROCALCITONIN  MAGNESIUM  PHOSPHORUS  LIPASE, BLOOD  ACETAMINOPHEN LEVEL  SALICYLATE LEVEL  COMPREHENSIVE METABOLIC PANEL  CBC WITH DIFFERENTIAL/PLATELET  MAGNESIUM  PHOSPHORUS  HIV ANTIBODY (ROUTINE TESTING)  BRAIN NATRIURETIC PEPTIDE  I-STAT CG4 LACTIC ACID, ED    EKG EKG Interpretation  Date/Time:  Saturday September 15 2017 19:10:45 EDT Ventricular Rate:  128 PR Interval:    QRS Duration: 76 QT Interval:  303 QTC Calculation: 403 R Axis:   83 Text Interpretation:  Sinus tachycardia Paired ventricular premature complexes Consider right ventricular hypertrophy probable atrial fib  Confirmed by Malvin Johns [id number]) on 09/15/2017 8:11:45 PM   Radiology Dg Chest Port 1 View  Result Date: 09/15/2017 CLINICAL DATA:  Shortness of breath. EXAM: PORTABLE CHEST 1 VIEW COMPARISON:  May 23, 2016 FINDINGS: Infiltrates are seen in the lung bases, similar in appearance to more remote studies. The heart size borderline. The hila are normal. No pneumothorax. No nodules or masses. IMPRESSION: Bibasilar infiltrates could represent developing ARDS versus pneumonia. Recommend clinical correlation. Recommend follow-up to resolution. Electronically Signed   By: Dorise Bullion III M.D   On: 09/15/2017 19:57    Procedures Procedures (including critical care time)  Medications Ordered in ED Medications  hydrALAZINE (APRESOLINE) injection 10 mg (10 mg Intravenous Given 09/15/17 2233)  prochlorperazine (COMPAZINE) injection 10 mg (has no administration in time range)  ondansetron (ZOFRAN) injection 4 mg (4 mg Intravenous Given 09/15/17 2226)  0.9 %  sodium chloride infusion (has no administration in time range)  pantoprazole (PROTONIX) injection 40 mg (has no administration in time range)  acetaminophen (TYLENOL) tablet 650 mg (has no administration in time range)  piperacillin-tazobactam (ZOSYN) IVPB 3.375 g (0 g Intravenous Stopped 09/15/17 2036)  vancomycin (VANCOCIN) IVPB 1000 mg/200 mL premix (0 mg Intravenous Stopped 09/15/17 2211)  sodium chloride 0.9 % bolus 500 mL (0 mLs Intravenous Stopped 09/15/17 2211)  furosemide (LASIX) injection 80 mg (80 mg Intravenous Given 09/15/17 2225)  sodium bicarbonate injection 50 mEq (50 mEq Intravenous Given 09/15/17 2236)     Initial Impression / Assessment and Plan / ED Course  I have reviewed the triage vital signs and the nursing notes.  Pertinent labs & imaging results that were available during my care of the patient were reviewed by me and considered in my medical decision making (see chart for details).     Patient presents emergency  department with tachypnea, increased work of breathing, tachycardia and hypoxia.  She was placed on nasal cannula which he currently is on.  Her chest x-ray shows bilateral infiltrates/ARDS.  I went back to check on the patient and was going to put her on BiPAP although she actually seems to have improved a little bit.  She is still tachypneic but is talking in full sentences and has less work of breathing.  I will hold off on BiPAP at this point.  She does have evidence of sepsis with a markedly elevated lactate and was treated aggressively with antibiotics and IV fluids.  Given her history of CHF, fluids were given over a prolonged period of time.  However, the full 30 cc/kg bolus was ordered given her markedly elevated lactate.  She does appear to be in atrial fibrillation with RVR.  Her heart rate has come down into the low 100s.  She does have an elevated troponin at 5 but no ischemic changes noted on EKG.  She currently is denying any chest pain.  She had a recent cardiac catheterization about 2 weeks ago which showed nonobstructive coronary artery disease.  This could represent demand ischemia.  21:30, pt seems more SOB, will start BIPAP, BNP markedly elevated, likely has a component of CHF.  Discussed with ICU attending who will see pt.  I also consulted Dr. Rhae Hammock with cardiology who will see the pt to try and obtain bedside echo.  CRITICAL CARE Performed by: Malvin Johns Total critical care time: 70 minutes Critical care time was exclusive of separately billable procedures and treating other patients. Critical care was necessary to treat or prevent imminent or life-threatening deterioration. Critical care  was time spent personally by me on the following activities: development of treatment plan with patient and/or surrogate as well as nursing, discussions with consultants, evaluation of patient's response to treatment, examination of patient, obtaining history from patient or surrogate,  ordering and performing treatments and interventions, ordering and review of laboratory studies, ordering and review of radiographic studies, pulse oximetry and re-evaluation of patient's condition.   Final Clinical Impressions(s) / ED Diagnoses   Final diagnoses:  Shortness of breath  Sepsis, due to unspecified organism ([place])  Congestive heart failure, unspecified HF chronicity, unspecified heart failure type [facility])  NSTEMI (non-ST elevated myocardial infarction) [facility])    ED Discharge Orders    None       Malvin Johns, MD 09/15/17 2257

## 2017-09-15 NOTE — ED Notes (Deleted)
Please call sister Arlyce Harman  [phone] with any updtaes

## 2017-09-15 NOTE — ED Notes (Signed)
I stat Lac Acid results  Of 10.72 reported to Dr. Tamera Punt, and Berna Bue Rn repeated back.

## 2017-09-16 ENCOUNTER — Inpatient Hospital Stay (HOSPITAL_COMMUNITY): Payer: BLUE CROSS/BLUE SHIELD

## 2017-09-16 DIAGNOSIS — I5030 Unspecified diastolic (congestive) heart failure: Secondary | ICD-10-CM

## 2017-09-16 LAB — PROCALCITONIN
PROCALCITONIN: 0.13 ng/mL
Procalcitonin: <0.1 ng/mL

## 2017-09-16 LAB — BRAIN NATRIURETIC PEPTIDE: B Natriuretic Peptide: >4500 pg/mL — ABNORMAL HIGH (ref 0.0–100.0)

## 2017-09-16 LAB — COMPREHENSIVE METABOLIC PANEL
ALK PHOS: 77 U/L (ref 38–126)
ALT: 123 U/L — AB (ref 14–54)
ANION GAP: 15 (ref 5–15)
AST: 92 U/L — ABNORMAL HIGH (ref 15–41)
Albumin: 2.7 g/dL — ABNORMAL LOW (ref 3.5–5.0)
BUN: 29 mg/dL — AB (ref 6–20)
CO2: 22 mmol/L (ref 22–32)
Calcium: 7.8 mg/dL — ABNORMAL LOW (ref 8.9–10.3)
Chloride: 96 mmol/L — ABNORMAL LOW (ref 101–111)
Creatinine, Ser: 1.56 mg/dL — ABNORMAL HIGH (ref 0.44–1.00)
GFR calc Af Amer: 40 mL/min — ABNORMAL LOW (ref >60)
GFR calc non Af Amer: 34 mL/min — ABNORMAL LOW (ref >60)
GLUCOSE: 145 mg/dL — AB (ref 65–99)
POTASSIUM: 3 mmol/L — AB (ref 3.5–5.1)
Sodium: 133 mmol/L — ABNORMAL LOW (ref 135–145)
Total Bilirubin: 2.1 mg/dL — ABNORMAL HIGH (ref 0.3–1.2)
Total Protein: 5.5 g/dL — ABNORMAL LOW (ref 6.5–8.1)

## 2017-09-16 LAB — CBC WITH DIFFERENTIAL/PLATELET
Basophils Absolute: 0 10*3/uL (ref 0.0–0.1)
Basophils Relative: 0 %
Eosinophils Absolute: 0 10*3/uL (ref 0.0–0.7)
Eosinophils Relative: 0 %
HEMATOCRIT: 31 % — AB (ref 36.0–46.0)
Hemoglobin: 9.2 g/dL — ABNORMAL LOW (ref 12.0–15.0)
LYMPHS PCT: 24 %
Lymphs Abs: 2.1 10*3/uL (ref 0.7–4.0)
MCH: 23.7 pg — AB (ref 26.0–34.0)
MCHC: 29.7 g/dL — AB (ref 30.0–36.0)
MCV: 79.7 fL (ref 78.0–100.0)
MONOS PCT: 11 %
Monocytes Absolute: 1 10*3/uL (ref 0.1–1.0)
NEUTROS ABS: 6 10*3/uL (ref 1.7–7.7)
Neutrophils Relative %: 65 %
Platelets: 126 10*3/uL — ABNORMAL LOW (ref 150–400)
RBC: 3.89 MIL/uL (ref 3.87–5.11)
RDW: 15.9 % — ABNORMAL HIGH (ref 11.5–15.5)
WBC: 9.1 10*3/uL (ref 4.0–10.5)

## 2017-09-16 LAB — URINALYSIS, ROUTINE W REFLEX MICROSCOPIC
BILIRUBIN URINE: NEGATIVE
Glucose, UA: NEGATIVE mg/dL
Ketones, ur: NEGATIVE mg/dL
Leukocytes, UA: NEGATIVE
NITRITE: NEGATIVE
PH: 6 (ref 5.0–8.0)
Protein, ur: 100 mg/dL — AB
Specific Gravity, Urine: 1.009 (ref 1.005–1.030)

## 2017-09-16 LAB — HIV ANTIBODY (ROUTINE TESTING W REFLEX): HIV Screen 4th Generation wRfx: NONREACTIVE

## 2017-09-16 LAB — RAPID URINE DRUG SCREEN, HOSP PERFORMED
AMPHETAMINES: NOT DETECTED
BENZODIAZEPINES: NOT DETECTED
Barbiturates: NOT DETECTED
Cocaine: NOT DETECTED
OPIATES: NOT DETECTED
Tetrahydrocannabinol: NOT DETECTED

## 2017-09-16 LAB — TROPONIN I
TROPONIN I: 8.99 ng/mL — AB (ref <0.03)
Troponin I: 8.63 ng/mL (ref <0.03)
Troponin I: 9.29 ng/mL (ref <0.03)

## 2017-09-16 LAB — LIPASE, BLOOD: Lipase: 34 U/L (ref 11–51)

## 2017-09-16 LAB — LACTIC ACID, PLASMA
LACTIC ACID, VENOUS: 5.8 mmol/L — AB (ref 0.5–1.9)
LACTIC ACID, VENOUS: 3.5 mmol/L — AB (ref 0.5–1.9)
Lactic Acid, Venous: 2.8 mmol/L (ref 0.5–1.9)

## 2017-09-16 LAB — ECHOCARDIOGRAM COMPLETE
Height: 67 in
Weight: 2119.94 oz

## 2017-09-16 LAB — SALICYLATE LEVEL

## 2017-09-16 LAB — MAGNESIUM
Magnesium: 2 mg/dL (ref 1.7–2.4)
Magnesium: 1.8 mg/dL (ref 1.7–2.4)

## 2017-09-16 LAB — ACETAMINOPHEN LEVEL: Acetaminophen (Tylenol), Serum: <10 ug/mL — ABNORMAL LOW (ref 10–30)

## 2017-09-16 LAB — PHOSPHORUS
PHOSPHORUS: 3.1 mg/dL (ref 2.5–4.6)
Phosphorus: 2.9 mg/dL (ref 2.5–4.6)

## 2017-09-16 LAB — MRSA PCR SCREENING: MRSA BY PCR: NEGATIVE

## 2017-09-16 LAB — HEPARIN LEVEL (UNFRACTIONATED): Heparin Unfractionated: 0.15 IU/mL — ABNORMAL LOW (ref 0.30–0.70)

## 2017-09-16 MED ORDER — LORAZEPAM 0.5 MG PO TABS
0.5000 mg | ORAL_TABLET | Freq: Four times a day (QID) | ORAL | Status: DC | PRN
Start: 2017-09-16 — End: 2017-09-24
  Administered 2017-09-17 – 2017-09-23 (×4): 0.5 mg via ORAL
  Filled 2017-09-16 (×5): qty 1

## 2017-09-16 MED ORDER — FUROSEMIDE 40 MG PO TABS
40.0000 mg | ORAL_TABLET | Freq: Every day | ORAL | Status: DC
  Administered 2017-09-16 – 2017-09-17 (×2): 40 mg via ORAL
  Filled 2017-09-16 (×2): qty 1

## 2017-09-16 MED ORDER — IPRATROPIUM-ALBUTEROL 0.5-2.5 (3) MG/3ML IN SOLN
3.0000 mL | RESPIRATORY_TRACT | Status: DC | PRN
  Administered 2017-09-22 – 2017-09-24 (×4): 3 mL via RESPIRATORY_TRACT
  Filled 2017-09-16 (×4): qty 3

## 2017-09-16 MED ORDER — POTASSIUM CHLORIDE CRYS ER 20 MEQ PO TBCR
40.0000 meq | EXTENDED_RELEASE_TABLET | Freq: Three times a day (TID) | ORAL | Status: AC
  Administered 2017-09-16 (×2): 40 meq via ORAL
  Filled 2017-09-16 (×2): qty 2

## 2017-09-16 MED ORDER — ASPIRIN EC 81 MG PO TBEC
81.0000 mg | DELAYED_RELEASE_TABLET | Freq: Every day | ORAL | Status: DC
  Administered 2017-09-16 – 2017-09-24 (×9): 81 mg via ORAL
  Filled 2017-09-16 (×9): qty 1

## 2017-09-16 MED ORDER — FUROSEMIDE 10 MG/ML IJ SOLN
40.0000 mg | Freq: Once | INTRAMUSCULAR | Status: AC
  Administered 2017-09-16: 40 mg via INTRAVENOUS
  Filled 2017-09-16: qty 4

## 2017-09-16 MED ORDER — CARVEDILOL 12.5 MG PO TABS
12.5000 mg | ORAL_TABLET | Freq: Two times a day (BID) | ORAL | Status: DC
  Administered 2017-09-16 – 2017-09-24 (×18): 12.5 mg via ORAL
  Filled 2017-09-16 (×19): qty 1

## 2017-09-16 MED ORDER — GI COCKTAIL ~~LOC~~: 30.0000 mL | Freq: Once | ORAL | Status: DC

## 2017-09-16 MED ORDER — ORAL CARE MOUTH RINSE
15.0000 mL | Freq: Two times a day (BID) | OROMUCOSAL | Status: DC
  Administered 2017-09-16 – 2017-09-23 (×7): 15 mL via OROMUCOSAL

## 2017-09-16 MED ORDER — SPIRONOLACTONE 25 MG PO TABS
25.0000 mg | ORAL_TABLET | Freq: Every day | ORAL | Status: DC
  Administered 2017-09-16 – 2017-09-17 (×2): 25 mg via ORAL
  Filled 2017-09-16 (×2): qty 1

## 2017-09-16 MED ORDER — PERFLUTREN LIPID MICROSPHERE
1.0000 mL | INTRAVENOUS | Status: AC | PRN
  Administered 2017-09-16: 4.5 mL via INTRAVENOUS
  Filled 2017-09-16: qty 10

## 2017-09-16 MED ORDER — MAGNESIUM SULFATE 2 GM/50ML IV SOLN
2.0000 g | Freq: Once | INTRAVENOUS | Status: AC
  Administered 2017-09-16: 2 g via INTRAVENOUS
  Filled 2017-09-16: qty 50

## 2017-09-16 NOTE — Progress Notes (Signed)
  Echocardiogram 2D Echocardiogram with definity has been performed.  Darlina Sicilian M 09/16/2017, 12:13 PM

## 2017-09-16 NOTE — Progress Notes (Signed)
Dr Tamala Julian notified of pt HR 110-150 afib/ SR with Pac's. B/p 167/114. Orders received to give coreg Po.

## 2017-09-16 NOTE — Progress Notes (Signed)
PULMONARY / CRITICAL CARE MEDICINE   Name: Glenda Johnson MRN: [id number] DOB: [birth year]    ADMISSION DATE:  09/15/2017 CONSULTATION DATE:  09/15/2017  REFERRING MD:  EDP , Dr. Tamera Punt  CHIEF COMPLAINT:  Sob and abd pain/n/v   HISTORY OF PRESENT ILLNESS:   63 yo female smoker presented to ER with dyspnea x 2 days associated with nausea and vomiting.  Found to have b/l infiltrates on chest imaging from pulmonary edema and elevated cardiac enzymes.  PMHx of CHF, A fib (declined anticoagulation), HTN, CAD, Hep C, Emphysema, Renal artery stenosis.  SUBJECTIVE:  Denies chest pain.  Breathing better.  Still has some nausea, and not much appetite.  VITAL SIGNS: BP 114/66   Pulse (!) 111   Temp 98.1 F (36.7 C) (Oral)   Resp (!) 28   Ht 5\' 7"  (1.702 m)   Wt 132 lb 7.9 oz (60.1 kg)   SpO2 92%   BMI 20.75 kg/m   INTAKE / OUTPUT: I/O last 3 completed shifts: In: 171 [P.O.:120; I.V.:51] Out: 2150 [Urine:2150]  PHYSICAL EXAMINATION:  General - pleasant Eyes - pupils reactive ENT - no sinus tenderness, no oral exudate, no LAN Cardiac - irregular, no murmur Chest - faint basilar rales Abd - soft, non tender Ext - no edema Skin - no rashes Neuro - normal strength Psych - normal mood   LABS:  BMET Recent Labs  Lab 09/15/17 1941 09/15/17 2005 09/16/17 0445  NA 132* 130* 133*  K 4.3 4.2 3.0*  CL 100* 97* 96*  CO2  --  14* 22  BUN 33* 30* 29*  CREATININE 1.50* 1.64* 1.56*  GLUCOSE 120* 116* 145*    Electrolytes Recent Labs  Lab 09/15/17 2005 09/16/17 0128 09/16/17 0445  CALCIUM 8.6*  --  7.8*  MG  --  2.0 1.8  PHOS  --  3.1 2.9    CBC Recent Labs  Lab 09/15/17 1941 09/15/17 2005 09/16/17 0445  WBC  --  12.3* 9.1  HGB 14.6 11.2* 9.2*  HCT 43.0 38.4 31.0*  PLT  --  166 126*    Coag's No results for input(s): APTT, INR in the last 168 hours.  Sepsis Markers Recent Labs  Lab 09/16/17 0128 09/16/17 0445 09/16/17 0604  LATICACIDVEN 5.8* 3.5*  2.8*  PROCALCITON <0.10 0.13  --     ABG Recent Labs  Lab 09/15/17 2013  PHART 7.433  PCO2ART 19.0*  PO2ART 109.0*    Liver Enzymes Recent Labs  Lab 09/15/17 2005 09/16/17 0445  AST 116* 92*  ALT 166* 123*  ALKPHOS 106 77  BILITOT 1.9* 2.1*  ALBUMIN 3.4* 2.7*    Cardiac Enzymes Recent Labs  Lab 09/16/17 0128 09/16/17 0445  TROPONINI 8.63* 9.29*    Glucose No results for input(s): GLUCAP in the last 168 hours.  Imaging US Abdomen Complete  Result Date: 09/16/2017 CLINICAL DATA:  Upper abdominal pain. EXAM: ABDOMEN ULTRASOUND COMPLETE COMPARISON:  None. FINDINGS: Gallbladder: Partially distended with diffuse gallbladder wall thickening measuring up to 9 mm. No gallstones or sludge. No pericholecystic fluid. No sonographic Mccadden sign noted by sonographer. Common bile duct: Diameter: 5 mm. Liver: No focal lesion identified. Diffusely increased in parenchymal echogenicity. Portal vein is patent on color Doppler imaging with normal direction of blood flow towards the liver. IVC: No abnormality visualized. Pancreas: Majority obscured by bowel gas. Spleen: Size and appearance within normal limits. Right Kidney: Length: 8.0 cm. Thinning of the renal parenchyma. Questionable increased echogenicity. No mass or hydronephrosis  visualized. Left Kidney: Length: 10.6 cm. Echogenicity within normal limits. No mass or hydronephrosis visualized. Abdominal aorta: No aneurysm visualized. Atherosclerotic calcifications noted. Other findings: Bilateral pleural effusions. IMPRESSION: 1. Diffuse gallbladder wall thickening of 9 mm without gallstones or pericholecystic more phi sign. Findings favor systemic causes such as passive congestion or secondary to hepatic causes over primary gallbladder inflammation. No biliary dilatation. 2. Increased hepatic echogenicity suggests steatosis. 3. Small right kidney. 4. Bilateral pleural effusions. Electronically Signed   By: Jeb Levering M.D.   On:  09/16/2017 00:49   Dg Chest Port 1 View  Result Date: 09/16/2017 CLINICAL DATA:  Pulmonary edema EXAM: PORTABLE CHEST 1 VIEW COMPARISON:  Portable exam 0421 hours compared to 09/15/2017 FINDINGS: Upper normal heart size. Stable mediastinal contours. Diffuse pulmonary infiltrates in the mid to lower lungs bilaterally, could represent pulmonary edema or multifocal infection. No gross pleural effusion or pneumothorax. Bones unremarkable. IMPRESSION: Persistent BILATERAL pulmonary infiltrates. Electronically Signed   By: Lavonia Dana M.D.   On: 09/16/2017 07:49   Dg Chest Port 1 View  Result Date: 09/15/2017 CLINICAL DATA:  Shortness of breath. EXAM: PORTABLE CHEST 1 VIEW COMPARISON:  May 23, 2016 FINDINGS: Infiltrates are seen in the lung bases, similar in appearance to more remote studies. The heart size borderline. The hila are normal. No pneumothorax. No nodules or masses. IMPRESSION: Bibasilar infiltrates could represent developing ARDS versus pneumonia. Recommend clinical correlation. Recommend follow-up to resolution. Electronically Signed   By: Dorise Bullion III M.D   On: 09/15/2017 19:57     STUDIES:  3/24 Abd u/s >> diffuse GB thickening from passive congestion, steatosis, small Rt kidney, b/l effusions  CULTURES: 3/23 BC >>  ANTIBIOTICS: 3/23 Vanc >> 3/23 3/23 Zosyn >> 3/23  SIGNIFICANT EVENTS: 3/23 admit from ER, cardiology consulted 3/24 Transfer to tele  DISCUSSION: Respiratory status improved.  Troponin still trending up.  Has irregular heart rhythm.  ASSESSMENT / PLAN:  Acute hypoxic respiratory failure 2nd to acute pulmonary edema. Elevated troponin. - oxygen to keep SpO2 > 92% - f/u CXR intermittently - can d/c Bipap - continue heparin gtt - f/u Echo  Tobacco abuse with hx of emphysema. - prn BDs  CKD 2. Hypokalemia, hypomagnesemia. - f/u BMET - replace electrolytes as needed  Lactic acidosis 2nd to hypoxia. - improved  Anemia of critical  illness. Mild thrombocytopenia. - f/u CBC  Elevated LFTs. - from passive congestion - f/u labs  DVT prophylaxis - heparin gtt SUP - not indicated Nutrition - heart healthy diet Goals of care - no CPR, no intubation, no defibrillation  D/w cardiology  Transfer to tele 3/24 >> To [facility] 3/25 and PCCM off  Chesley Mires, MD [place] 09/16/2017, 9:54 AM Pager:  [phone] After 3pm call: [phone]

## 2017-09-16 NOTE — Progress Notes (Signed)
CRITICAL VALUE ALERT  Critical Value:  Lactic acid 5.8 and troponin 8.63  Date & Time Notied:  09/16/17 0300  Provider Notified: Dr Tamala Julian  Orders Received/Actions taken: None

## 2017-09-16 NOTE — Progress Notes (Signed)
Progress Note  Patient Name: Glenda Johnson Date of Encounter: 09/16/2017  Primary Cardiologist: No primary care provider on file.   Subjective   Feeling much better than she did on admission.  No shortness of breath lying almost fully supine in bed.  Oxygen saturation 92% on 2 L nasal cannula oxygen supplementation. Roughly 2 L net negative fluid balance. Reportedly weight down 8 pounds since admission. Blood pressure is very normal.  Continues to have incessant ectopy with multiple PAC morphologies, atrial couplets, brief bursts of atrial tachycardia up to 10 beats. Potassium 3.0.  Inpatient Medications    Scheduled Meds: . aspirin EC  81 mg Oral Daily  . carvedilol  12.5 mg Oral BID WC  . furosemide  40 mg Intravenous Once  . gi cocktail  30 mL Oral Once  . potassium chloride  40 mEq Oral Q8H  . spironolactone  25 mg Oral Daily   Continuous Infusions: . sodium chloride    . heparin 1,050 Units/hr (09/16/17 0732)  . magnesium sulfate 1 - 4 g bolus IVPB     PRN Meds: sodium chloride, acetaminophen, hydrALAZINE, ipratropium-albuterol, LORazepam, ondansetron (ZOFRAN) IV, prochlorperazine   Vital Signs    Vitals:   09/16/17 0600 09/16/17 0700 09/16/17 0800 09/16/17 0900  BP: 114/67 117/78 (!) 104/59 114/66  Pulse: 82 (!) 51 86 (!) 111  Resp: (!) 21 (!) 25 18 (!) 28  Temp:      TempSrc:      SpO2: 96% 91% 95% 92%  Weight:      Height:        Intake/Output Summary (Last 24 hours) at 09/16/2017 1003 Last data filed at 09/16/2017 0903 Gross per 24 hour  Intake 198.5 ml  Output 2350 ml  Net -2151.5 ml   Filed Weights   09/15/17 1853 09/16/17 0045 09/16/17 0530  Weight: 130 lb (59 kg) 140 lb 14 oz (63.9 kg) 132 lb 7.9 oz (60.1 kg)    Telemetry    Sinus rhythm with extremely frequent premature atrial complexes with different morphologies, often atrial couplets, 3-10 beat runs of SVT.  Occasional PVCs.- Personally Reviewed  ECG    Sinus tachycardia with  very frequent PACs, atrial couplets, some with aberrant conduction.- Personally Reviewed  Physical Exam  Appears a little dazed, but is fully alert and oriented.  Looks comfortable, not tachypneic GEN: No acute distress.   Neck:  Has JVD all the way to the angle of the jaw Cardiac:  Irregular rhythm, no murmurs, rubs, or gallops.  Respiratory: Clear to auscultation bilaterally. GI: Soft, nontender, non-distended no abdominal bruits are heard MS: No edema; No deformity. Neuro:  Nonfocal  Psych: Normal affect   Labs    Chemistry Recent Labs  Lab 09/15/17 1941 09/15/17 2005 09/16/17 0445  NA 132* 130* 133*  K 4.3 4.2 3.0*  CL 100* 97* 96*  CO2  --  14* 22  GLUCOSE 120* 116* 145*  BUN 33* 30* 29*  CREATININE 1.50* 1.64* 1.56*  CALCIUM  --  8.6* 7.8*  PROT  --  6.9 5.5*  ALBUMIN  --  3.4* 2.7*  AST  --  116* 92*  ALT  --  166* 123*  ALKPHOS  --  106 77  BILITOT  --  1.9* 2.1*  GFRNONAA  --  32* 34*  GFRAA  --  37* 40*  ANIONGAP  --  19* 15     Hematology Recent Labs  Lab 09/15/17 1941 09/15/17 2005 09/16/17 0445  WBC  --  12.3* 9.1  RBC  --  4.69 3.89  HGB 14.6 11.2* 9.2*  HCT 43.0 38.4 31.0*  MCV  --  81.9 79.7  MCH  --  23.9* 23.7*  MCHC  --  29.2* 29.7*  RDW  --  16.3* 15.9*  PLT  --  166 126*    Cardiac Enzymes Recent Labs  Lab 09/16/17 0128 09/16/17 0445  TROPONINI 8.63* 9.29*    Recent Labs  Lab 09/15/17 1906  TROPIPOC 5.50*     BNP Recent Labs  Lab 09/15/17 2006 09/16/17 0445  BNP >4,500.0* >4,500.0*     DDimer No results for input(s): DDIMER in the last 168 hours.   Radiology    US Abdomen Complete  Result Date: 09/16/2017 CLINICAL DATA:  Upper abdominal pain. EXAM: ABDOMEN ULTRASOUND COMPLETE COMPARISON:  None. FINDINGS: Gallbladder: Partially distended with diffuse gallbladder wall thickening measuring up to 9 mm. No gallstones or sludge. No pericholecystic fluid. No sonographic [place] sign noted by sonographer. Common bile  duct: Diameter: 5 mm. Liver: No focal lesion identified. Diffusely increased in parenchymal echogenicity. Portal vein is patent on color Doppler imaging with normal direction of blood flow towards the liver. IVC: No abnormality visualized. Pancreas: Majority obscured by bowel gas. Spleen: Size and appearance within normal limits. Right Kidney: Length: 8.0 cm. Thinning of the renal parenchyma. Questionable increased echogenicity. No mass or hydronephrosis visualized. Left Kidney: Length: 10.6 cm. Echogenicity within normal limits. No mass or hydronephrosis visualized. Abdominal aorta: No aneurysm visualized. Atherosclerotic calcifications noted. Other findings: Bilateral pleural effusions. IMPRESSION: 1. Diffuse gallbladder wall thickening of 9 mm without gallstones or pericholecystic more phi sign. Findings favor systemic causes such as passive congestion or secondary to hepatic causes over primary gallbladder inflammation. No biliary dilatation. 2. Increased hepatic echogenicity suggests steatosis. 3. Small right kidney. 4. Bilateral pleural effusions. Electronically Signed   By: Jeb Levering M.D.   On: 09/16/2017 00:49   Dg Chest Port 1 View  Result Date: 09/16/2017 CLINICAL DATA:  Pulmonary edema EXAM: PORTABLE CHEST 1 VIEW COMPARISON:  Portable exam 0421 hours compared to 09/15/2017 FINDINGS: Upper normal heart size. Stable mediastinal contours. Diffuse pulmonary infiltrates in the mid to lower lungs bilaterally, could represent pulmonary edema or multifocal infection. No gross pleural effusion or pneumothorax. Bones unremarkable. IMPRESSION: Persistent BILATERAL pulmonary infiltrates. Electronically Signed   By: Lavonia Dana M.D.   On: 09/16/2017 07:49   Dg Chest Port 1 View  Result Date: 09/15/2017 CLINICAL DATA:  Shortness of breath. EXAM: PORTABLE CHEST 1 VIEW COMPARISON:  May 23, 2016 FINDINGS: Infiltrates are seen in the lung bases, similar in appearance to more remote studies. The heart  size borderline. The hila are normal. No pneumothorax. No nodules or masses. IMPRESSION: Bibasilar infiltrates could represent developing ARDS versus pneumonia. Recommend clinical correlation. Recommend follow-up to resolution. Electronically Signed   By: Dorise Bullion III M.D   On: 09/15/2017 19:57    Cardiac Studies   LHC 09/03/17 ([facility]): 10% LM, 10% Diag, 0% LCX, 10% OM, 10-20% RCA  LHC [facility] 04/2016: Mild non-obstructive CAD 60% D1, 30% RCA, LVEF 55%  TTE 01/2017 ([facility]): A complete portable two-dimensional transthoracic echocardiogram with color flow Doppler and Spectral Doppler was performed. The study was technically adequate. There is a large pleural effusion. The left ventricle is grossly normal size. There is mild concentric left ventricular hypertrophy. The left ventricular ejection fraction is mildly reduced (45-50%). There is moderate (2+) mitral regurgitation. There is mild diffuse hypokinesis  of the left ventricle. Right ventricular systolic pressure is normal. The left atrium is mildly dilated. Unable to adequately determine diastolic dysfunction.   Patient Profile     62 y.o. female recurrent hospitalization with acute heart failure and abnormal cardiac enzymes, usually in the setting of severe hypertension mildly depressed left ventricular systolic function.  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 Tamala Julian".  On CT her right kidney is small.  Assessment & Plan    1. CAD s/p NSTEMI: As on previous occasions, suspect that this was demand ischemia related.  No plan for repeat cardiac catheterization.  Doubt that she had an acute atherothrombotic event and will discontinue her heparin.  Focus should be on aggressive blood pressure control. 2.  Pulmonary edema: Very rapidly improved with blood pressure control and diuresis.  Still has signs of hypervolemia.  Continue diuretics.  She hates taking the big  potassium pills and because of this also curled tails her diuretic use at home.  We will try to correct hypokalemia using spironolactone.  Judging by her clinical presentation, the atrophic right kidney and her history of previous angioplasty, it seems very likely that she has recurrent renal artery stenosis as a cause for her presentation.  We will check a dedicated duplex ultrasound study.  Preliminary bedside echo reviewed by fellow last night suggested that her left ventricular systolic function is now moderate to severely depressed (but this was performed while her blood pressure was severely elevated).  Formal echo today pending. 3. PAT: She has incredibly frequent PACs which may mimic atrial fibrillation.  I have not seen any true A. fib.  It is possible that her ectopy is not enhanced by hypokalemia. 4.  Hep C: She completed antiviral therapy, but has never had confirmation of virological cure.  For questions or updates, please contact [place] Please consult [email or web address] for contact info under Cardiology/STEMI.      Signed, Sanda Klein, MD  09/16/2017, 10:03 AM

## 2017-09-16 NOTE — Progress Notes (Signed)
ANTICOAGULATION CONSULT NOTE - Initial Consult  Pharmacy Consult for heparin Indication: atrial fibrillation  Allergies  Allergen Reactions  . Codeine Anaphylaxis  . Tramadol Anaphylaxis    No hydrocodone or anything connected No hydrocodone or anything connected   . Clarithromycin Nausea And Vomiting  . Citalopram Other (See Comments)    Caused more depression   . Fish Allergy Itching    ears  . Other     Patient says that all narcotics give her anaphylaxis.   . Wellbutrin [Bupropion] Other (See Comments)    "could not speak a full sentence for a whole year"    Patient Measurements: Height: 5\' 7"  (170.2 cm) Weight: 132 lb 7.9 oz (60.1 kg) IBW/kg (Calculated) : 61.6 Heparin Dosing Weight: 60 kg  Vital Signs: Temp: 98.1 F (36.7 C) (03/24 0300) Temp Source: Oral (03/24 0300) BP: 117/78 (03/24 0700) Pulse Rate: 51 (03/24 0700)  Labs: Recent Labs    09/15/17 1941 09/15/17 2005 09/16/17 0128 09/16/17 0445 09/16/17 0604  HGB 14.6 11.2*  --  9.2*  --   HCT 43.0 38.4  --  31.0*  --   PLT  --  166  --  126*  --   HEPARINUNFRC  --   --   --   --  0.15*  CREATININE 1.50* 1.64*  --   --   --   TROPONINI  --   --  8.63*  --   --     Medical History: Past Medical History:  Diagnosis Date  . Renal artery stenosis ([place])   . Secondary hypertension   . Tobacco abuse     Assessment: 62 yo female with AFib but is not on anticoagulation prior to admission. Starting heparin for AFib in meantime. Hgb 11.2, plts wnl, SCr up to 1.6, eCrCl ~ 30 ml/min.    Goal of Therapy:  Heparin level 0.3-0.7 units/ml Monitor platelets by anticoagulation protocol: Yes    Plan:  -Heparin bolus 3600 units x1 then 850 units/hr -Daily HL, CBC -Level in 6 hours   Zacchary Pompei, Jake Church 09/16/2017,7:10 AM   Addendum -Heparin level returned SUBtherapeutic -Increase heparin to 1050 units/hr -Check 6 hr level   Harvel Quale 09/16/2017 7:10 AM

## 2017-09-16 NOTE — Progress Notes (Addendum)
Critical lactic acid=2.8 Improved from last value.  NP for critical care paged.   0744 spoke with Velna Hatchet NP. No orders received.

## 2017-09-17 ENCOUNTER — Inpatient Hospital Stay (HOSPITAL_COMMUNITY): Payer: BLUE CROSS/BLUE SHIELD

## 2017-09-17 DIAGNOSIS — I509 Heart failure, unspecified: Secondary | ICD-10-CM

## 2017-09-17 DIAGNOSIS — I5033 Acute on chronic diastolic (congestive) heart failure: Secondary | ICD-10-CM

## 2017-09-17 DIAGNOSIS — J810 Acute pulmonary edema: Secondary | ICD-10-CM

## 2017-09-17 LAB — COMPREHENSIVE METABOLIC PANEL
ALK PHOS: 84 U/L (ref 38–126)
ALT: 96 U/L — ABNORMAL HIGH (ref 14–54)
ANION GAP: 13 (ref 5–15)
AST: 65 U/L — ABNORMAL HIGH (ref 15–41)
Albumin: 3 g/dL — ABNORMAL LOW (ref 3.5–5.0)
BILIRUBIN TOTAL: 1.9 mg/dL — AB (ref 0.3–1.2)
BUN: 35 mg/dL — ABNORMAL HIGH (ref 6–20)
CALCIUM: 8.4 mg/dL — AB (ref 8.9–10.3)
CO2: 22 mmol/L (ref 22–32)
Chloride: 94 mmol/L — ABNORMAL LOW (ref 101–111)
Creatinine, Ser: 1.85 mg/dL — ABNORMAL HIGH (ref 0.44–1.00)
GFR calc Af Amer: 32 mL/min — ABNORMAL LOW (ref >60)
GFR, EST NON AFRICAN AMERICAN: 28 mL/min — AB (ref >60)
Glucose, Bld: 123 mg/dL — ABNORMAL HIGH (ref 65–99)
POTASSIUM: 4.6 mmol/L (ref 3.5–5.1)
Sodium: 129 mmol/L — ABNORMAL LOW (ref 135–145)
TOTAL PROTEIN: 5.9 g/dL — AB (ref 6.5–8.1)

## 2017-09-17 LAB — HCV RNA QUANT: HCV Quantitative: NOT DETECTED IU/mL (ref >50)

## 2017-09-17 LAB — CBC
HEMATOCRIT: 33.7 % — AB (ref 36.0–46.0)
HEMOGLOBIN: 10.1 g/dL — AB (ref 12.0–15.0)
MCH: 23.8 pg — AB (ref 26.0–34.0)
MCHC: 30 g/dL (ref 30.0–36.0)
MCV: 79.3 fL (ref 78.0–100.0)
Platelets: 171 10*3/uL (ref 150–400)
RBC: 4.25 MIL/uL (ref 3.87–5.11)
RDW: 15.9 % — AB (ref 11.5–15.5)
WBC: 12.3 10*3/uL — ABNORMAL HIGH (ref 4.0–10.5)

## 2017-09-17 LAB — MAGNESIUM: MAGNESIUM: 2.1 mg/dL (ref 1.7–2.4)

## 2017-09-17 LAB — BRAIN NATRIURETIC PEPTIDE: B NATRIURETIC PEPTIDE 5: 3035.3 pg/mL — AB (ref 0.0–100.0)

## 2017-09-17 LAB — PHOSPHORUS: Phosphorus: 2.8 mg/dL (ref 2.5–4.6)

## 2017-09-17 MED ORDER — ENOXAPARIN SODIUM 30 MG/0.3ML ~~LOC~~ SOLN
30.0000 mg | SUBCUTANEOUS | Status: DC
  Administered 2017-09-17 – 2017-09-18 (×2): 30 mg via SUBCUTANEOUS
  Filled 2017-09-17 (×2): qty 0.3

## 2017-09-17 NOTE — Progress Notes (Signed)
Pt. Off floor for MRI.

## 2017-09-17 NOTE — Progress Notes (Signed)
Called report to [employer] on 2W. Patient moving to room 06. All personal belongings accounted for.

## 2017-09-17 NOTE — Progress Notes (Addendum)
*  PRELIMINARY RESULTS* Vascular Ultrasound Renal Artery Duplex has been completed.  Study was technically limited due to patient body habitus. Insonated right renal artery velocities suggest 1-59% stenosis. Unable to adequately evaluate left renal artery secondary to technical limitations. However, limited interrogation of the left renal artery origin demonstrates upper range 1-59% stenosis.  09/17/2017 9:58 AM Maudry Mayhew, BS, RVT, RDCS, RDMS

## 2017-09-17 NOTE — Progress Notes (Signed)
Pt. States feeling very fatigued, generalized weakness. Pale in coloration, drowsy affect.

## 2017-09-17 NOTE — Progress Notes (Signed)
PROGRESS NOTE    KYNSLEI ART  MBT:[id number] DOB: June 27, 1955 DOA: 09/15/2017 PCP: Paris Lore, MD  Brief Narrative: 62 year old female history of COPD, paroxysmal atrial fibrillation-declined anticoagulation, chronic diastolic and systolic CHF, hepatitis C, presented to the emergency room with respiratory distress from pulmonary edema, elevated cardiac enzymes, admitted to the ICU on BiPAP treated with diuretics. -Clinically improving, cardiology following, echo abnormal for cardiac MRI today -TX from [facility] to [facility] today 3/25  Assessment & Plan:   Principal Problem:   Acute respiratory failure with hypoxia ([place]) - due to flash pulmonary edema/hypertensive urgency -Also has underlying COPD -improved with diuretics, clinically felt to be euvolemic at this time -Off BiPAP -Change to by mouth Lasix  Acute on chronic diastolic CHF -Diuresed with IV Lasix as above and change to oral diuretics now -2-D echocardiogram abnormal with large mobile pedunculated mass in the left ventricle -Further evaluation with cardiac MRI today -Cardiology following  COPD/tobacco abuse -Stable, no wheezing noted, nebs PRN  CKd stage II -stable, monitor with diuretics  CAD status post NSTEMI/ Versus demand ischemia -Cards following, off heparin -Felt to be demand related from CHF -Continue medical management, no plan for repeat catheterization  History of hepatitis C -s/p Rx, Fu with GI at [facility]  DVT prophylaxis: add lovenox Code Status: FUll Code Family Communication: none at bedside Disposition Plan: Tx to tele  Consultants:   Cards   Procedures:   Antimicrobials:    Subjective: -tired,still some dyspnea but better   Objective: Vitals:   09/17/17 0730 09/17/17 0800 09/17/17 0900 09/17/17 1000  BP:  (!) 142/69 129/64 (!) 110/57  Pulse: 64 66 60 (!) 57  Resp: (!) 22 (!) 21 13 18   Temp:      TempSrc:      SpO2: 100% 99% 99% 95%  Weight:      Height:         Intake/Output Summary (Last 24 hours) at 09/17/2017 1102 Last data filed at 09/17/2017 0800 Gross per 24 hour  Intake 480 ml  Output 1195 ml  Net -715 ml   Filed Weights   09/16/17 0045 09/16/17 0530 09/17/17 0400  Weight: 63.9 kg (140 lb 14 oz) 60.1 kg (132 lb 7.9 oz) 64.9 kg (143 lb 1.3 oz)    Examination:  General exam: Appears calm and comfortable, chronically ill female Respiratory system: poor air movement, otherwise clear Cardiovascular system: S1 & S2 heard, RRR.  Gastrointestinal system: Abdomen is nondistended, soft and nontender.Normal bowel sounds heard. Central nervous system: Alert and oriented. No focal neurological deficits. Extremities: Symmetric 5 x 5 power. Skin: No rashes, lesions or ulcers Psychiatry: Judgement and insight appear normal. Mood & affect appropriate.     Data Reviewed:   CBC: Recent Labs  Lab 09/15/17 1941 09/15/17 2005 09/16/17 0445 09/17/17 0237  WBC  --  12.3* 9.1 12.3*  NEUTROABS  --  9.1* 6.0  --   HGB 14.6 11.2* 9.2* 10.1*  HCT 43.0 38.4 31.0* 33.7*  MCV  --  81.9 79.7 79.3  PLT  --  166 126* 536   Basic Metabolic Panel: Recent Labs  Lab 09/15/17 1941 09/15/17 2005 09/16/17 0128 09/16/17 0445 09/17/17 0237  NA 132* 130*  --  133* 129*  K 4.3 4.2  --  3.0* 4.6  CL 100* 97*  --  96* 94*  CO2  --  14*  --  22 22  GLUCOSE 120* 116*  --  145* 123*  BUN 33* 30*  --  29* 35*  CREATININE 1.50* 1.64*  --  1.56* 1.85*  CALCIUM  --  8.6*  --  7.8* 8.4*  MG  --   --  2.0 1.8 2.1  PHOS  --   --  3.1 2.9 2.8   GFR: Estimated Creatinine Clearance: 30.3 mL/min (A) (by C-G formula based on SCr of 1.85 mg/dL (H)). Liver Function Tests: Recent Labs  Lab 09/15/17 2005 09/16/17 0445 09/17/17 0237  AST 116* 92* 65*  ALT 166* 123* 96*  ALKPHOS 106 77 84  BILITOT 1.9* 2.1* 1.9*  PROT 6.9 5.5* 5.9*  ALBUMIN 3.4* 2.7* 3.0*   Recent Labs  Lab 09/16/17 0128  LIPASE 34   No results for input(s): AMMONIA in the last 168  hours. Coagulation Profile: No results for input(s): INR, PROTIME in the last 168 hours. Cardiac Enzymes: Recent Labs  Lab 09/16/17 0128 09/16/17 0445 09/16/17 1036  TROPONINI 8.63* 9.29* 8.99*   BNP (last 3 results) No results for input(s): PROBNP in the last 8760 hours. HbA1C: No results for input(s): HGBA1C in the last 72 hours. CBG: No results for input(s): GLUCAP in the last 168 hours. Lipid Profile: No results for input(s): CHOL, HDL, LDLCALC, TRIG, CHOLHDL, LDLDIRECT in the last 72 hours. Thyroid Function Tests: No results for input(s): TSH, T4TOTAL, FREET4, T3FREE, THYROIDAB in the last 72 hours. Anemia Panel: No results for input(s): VITAMINB12, FOLATE, FERRITIN, TIBC, IRON, RETICCTPCT in the last 72 hours. Urine analysis:    Component Value Date/Time   COLORURINE YELLOW 09/16/2017 0040   APPEARANCEUR CLEAR 09/16/2017 0040   LABSPEC 1.009 09/16/2017 0040   PHURINE 6.0 09/16/2017 0040   GLUCOSEU NEGATIVE 09/16/2017 0040   HGBUR SMALL (A) 09/16/2017 0040   BILIRUBINUR NEGATIVE 09/16/2017 0040   KETONESUR NEGATIVE 09/16/2017 0040   PROTEINUR 100 (A) 09/16/2017 0040   NITRITE NEGATIVE 09/16/2017 0040   LEUKOCYTESUR NEGATIVE 09/16/2017 0040   Sepsis Labs: @LABRCNTIP (procalcitonin:4,lacticidven:4)  ) Recent Results (from the past 240 hour(s))  Blood Culture (routine x 2)     Status: None (Preliminary result)   Collection Time: 09/15/17  7:50 PM  Result Value Ref Range Status   Specimen Description BLOOD BLOOD LEFT FOREARM  Final   Special Requests   Final    BOTTLES DRAWN AEROBIC AND ANAEROBIC Blood Culture adequate volume   Culture   Final    NO GROWTH < 24 HOURS Performed at [facility], [place] [address], [place], [place] [address]    Report Status PENDING  Incomplete  Blood Culture (routine x 2)     Status: None (Preliminary result)   Collection Time: 09/15/17  8:00 PM  Result Value Ref Range Status   Specimen Description BLOOD BLOOD RIGHT HAND   Final   Special Requests   Final    BOTTLES DRAWN AEROBIC AND ANAEROBIC Blood Culture adequate volume   Culture   Final    NO GROWTH < 24 HOURS Performed at [facility], [place] [address] [place], [place] [address]    Report Status PENDING  Incomplete  MRSA PCR Screening     Status: None   Collection Time: 09/16/17 12:36 AM  Result Value Ref Range Status   MRSA by PCR NEGATIVE NEGATIVE Final    Comment:        The GeneXpert MRSA Assay (FDA approved for NASAL specimens only), is one component of a comprehensive MRSA colonization surveillance program. It is not intended to diagnose MRSA infection nor to guide or monitor treatment  for MRSA infections. Performed at [facility], [place] [address], [place], [place] [address]          Radiology Studies: US Abdomen Complete  Result Date: 09/16/2017 CLINICAL DATA:  Upper abdominal pain. EXAM: ABDOMEN ULTRASOUND COMPLETE COMPARISON:  None. FINDINGS: Gallbladder: Partially distended with diffuse gallbladder wall thickening measuring up to 9 mm. No gallstones or sludge. No pericholecystic fluid. No sonographic [place] sign noted by sonographer. Common bile duct: Diameter: 5 mm. Liver: No focal lesion identified. Diffusely increased in parenchymal echogenicity. Portal vein is patent on color Doppler imaging with normal direction of blood flow towards the liver. IVC: No abnormality visualized. Pancreas: Majority obscured by bowel gas. Spleen: Size and appearance within normal limits. Right Kidney: Length: 8.0 cm. Thinning of the renal parenchyma. Questionable increased echogenicity. No mass or hydronephrosis visualized. Left Kidney: Length: 10.6 cm. Echogenicity within normal limits. No mass or hydronephrosis visualized. Abdominal aorta: No aneurysm visualized. Atherosclerotic calcifications noted. Other findings: Bilateral pleural effusions. IMPRESSION: 1. Diffuse gallbladder wall thickening of 9 mm without gallstones or pericholecystic  more phi sign. Findings favor systemic causes such as passive congestion or secondary to hepatic causes over primary gallbladder inflammation. No biliary dilatation. 2. Increased hepatic echogenicity suggests steatosis. 3. Small right kidney. 4. Bilateral pleural effusions. Electronically Signed   By: Jeb Levering M.D.   On: 09/16/2017 00:49   Dg Chest Port 1 View  Result Date: 09/16/2017 CLINICAL DATA:  Pulmonary edema EXAM: PORTABLE CHEST 1 VIEW COMPARISON:  Portable exam 0421 hours compared to 09/15/2017 FINDINGS: Upper normal heart size. Stable mediastinal contours. Diffuse pulmonary infiltrates in the mid to lower lungs bilaterally, could represent pulmonary edema or multifocal infection. No gross pleural effusion or pneumothorax. Bones unremarkable. IMPRESSION: Persistent BILATERAL pulmonary infiltrates. Electronically Signed   By: Lavonia Dana M.D.   On: 09/16/2017 07:49   Dg Chest Port 1 View  Result Date: 09/15/2017 CLINICAL DATA:  Shortness of breath. EXAM: PORTABLE CHEST 1 VIEW COMPARISON:  May 23, 2016 FINDINGS: Infiltrates are seen in the lung bases, similar in appearance to more remote studies. The heart size borderline. The hila are normal. No pneumothorax. No nodules or masses. IMPRESSION: Bibasilar infiltrates could represent developing ARDS versus pneumonia. Recommend clinical correlation. Recommend follow-up to resolution. Electronically Signed   By: Dorise Bullion III M.D   On: 09/15/2017 19:57        Scheduled Meds: . aspirin EC  81 mg Oral Daily  . carvedilol  12.5 mg Oral BID WC  . furosemide  40 mg Oral Daily  . gi cocktail  30 mL Oral Once  . mouth rinse  15 mL Mouth Rinse BID  . spironolactone  25 mg Oral Daily   Continuous Infusions: . sodium chloride       LOS: 2 days    Time spent: 74min    Domenic Polite, MD [facility] Page via [email or web address], password [email or web address] After 7PM please contact night-coverage  09/17/2017, 11:02 AM

## 2017-09-17 NOTE — Significant Event (Addendum)
Rapid Response Event Note  Overview: Neurologic - Vision changes - LSN - Unknown  Initial Focused Assessment: Called by RNs to assess patient for changes in visions. Upon arrival, patient was alert, she stated that her vision has been blurry and she has had difficulty seeing for the past 2-3 days. Patient is alert, oriented x 3, per staff has had brief episodes of confusion, speech is clear, delayed at times but clear.  No focal motor deficits, no facial droop, no gaze, Per patient, earlier today, she had RLE numbness as well. NIH 2 (Visual and Sensory). + pulses, VSS. No in acute distress and denies HA. LSN unknown since symptoms have been fluctuating.   Interventions: - NIH = 2  Plan of Care (if not transferred): - RN to page [facility] NP and update on patient's status. NP updated, RN to monitor neuro status.  - Call RRT if needed  Event Summary:   at    Call Time 2217 End Time [place], [place]

## 2017-09-17 NOTE — Progress Notes (Signed)
Pt. Has returned to unit from MRI

## 2017-09-17 NOTE — Plan of Care (Signed)
Patient becomes short of breath and fatigued when standing to transfer to [facility]. Patient states she feels overly tired after transferring to/from [facility] and bed. SpO2 remains 98-100 during transfers, no change in heart rate.

## 2017-09-17 NOTE — Plan of Care (Signed)
Patient understands need for calling for assistance when needing to use the [facility]. Patient able to verbalize importance of utilizing call bell when needing to toilet.

## 2017-09-17 NOTE — Progress Notes (Addendum)
Progress Note  Patient Name: Glenda Johnson Date of Encounter: 09/17/2017  Primary Cardiologist: Gaynell Face ,  Angelena Form  at [facility].   Subjective   62 year old female with a history of nonobstructive coronary artery disease, chronic systolic congestive heart failure, atrial fibrillation, COPD, hypertension who presented to the emergency room with increased shortness of breath.  She was found to have evidence of congestive heart failure and had an elevated troponin level.  She had marked hypertension at the time of admission.  She is breathing better today   Inpatient Medications    Scheduled Meds: . aspirin EC  81 mg Oral Daily  . carvedilol  12.5 mg Oral BID WC  . furosemide  40 mg Oral Daily  . gi cocktail  30 mL Oral Once  . mouth rinse  15 mL Mouth Rinse BID  . spironolactone  25 mg Oral Daily   Continuous Infusions: . sodium chloride     PRN Meds: sodium chloride, acetaminophen, hydrALAZINE, ipratropium-albuterol, LORazepam, ondansetron (ZOFRAN) IV, prochlorperazine   Vital Signs    Vitals:   09/17/17 0600 09/17/17 0700 09/17/17 0730 09/17/17 0800  BP: 124/67 124/70  (!) 142/69  Pulse: (!) 59 61 64 66  Resp: 16 14 (!) 22 (!) 21  Temp:      TempSrc:      SpO2: 99% 98% 100% 99%  Weight:      Height:        Intake/Output Summary (Last 24 hours) at 09/17/2017 0853 Last data filed at 09/17/2017 0800 Gross per 24 hour  Intake 660.5 ml  Output 1395 ml  Net -734.5 ml   Filed Weights   09/16/17 0045 09/16/17 0530 09/17/17 0400  Weight: 140 lb 14 oz (63.9 kg) 132 lb 7.9 oz (60.1 kg) 143 lb 1.3 oz (64.9 kg)    Telemetry    NSR at 60  - Personally Reviewed  ECG    NSR at 60  - Personally Reviewed  Physical Exam    GEN:  middle age female,   Sleepy  Neck: No JVD Cardiac: RRR, no murmurs,  Bilateral breast implants  Respiratory:  few rales in bases - esp in left  GI: Soft, nontender, non-distended  MS: No edema; No deformity. Neuro:   Nonfocal  Psych: Normal affect , somulent   Labs    Chemistry Recent Labs  Lab 09/15/17 2005 09/16/17 0445 09/17/17 0237  NA 130* 133* 129*  K 4.2 3.0* 4.6  CL 97* 96* 94*  CO2 14* 22 22  GLUCOSE 116* 145* 123*  BUN 30* 29* 35*  CREATININE 1.64* 1.56* 1.85*  CALCIUM 8.6* 7.8* 8.4*  PROT 6.9 5.5* 5.9*  ALBUMIN 3.4* 2.7* 3.0*  AST 116* 92* 65*  ALT 166* 123* 96*  ALKPHOS 106 77 84  BILITOT 1.9* 2.1* 1.9*  GFRNONAA 32* 34* 28*  GFRAA 37* 40* 32*  ANIONGAP 19* 15 13     Hematology Recent Labs  Lab 09/15/17 2005 09/16/17 0445 09/17/17 0237  WBC 12.3* 9.1 12.3*  RBC 4.69 3.89 4.25  HGB 11.2* 9.2* 10.1*  HCT 38.4 31.0* 33.7*  MCV 81.9 79.7 79.3  MCH 23.9* 23.7* 23.8*  MCHC 29.2* 29.7* 30.0  RDW 16.3* 15.9* 15.9*  PLT 166 126* 171    Cardiac Enzymes Recent Labs  Lab 09/16/17 0128 09/16/17 0445 09/16/17 1036  TROPONINI 8.63* 9.29* 8.99*    Recent Labs  Lab 09/15/17 1906  TROPIPOC 5.50*     BNP Recent Labs  Lab 09/15/17  2006 09/16/17 0445 09/17/17 0237  BNP >4,500.0* >4,500.0* 3,035.3*     DDimer No results for input(s): DDIMER in the last 168 hours.   Radiology    US Abdomen Complete  Result Date: 09/16/2017 CLINICAL DATA:  Upper abdominal pain. EXAM: ABDOMEN ULTRASOUND COMPLETE COMPARISON:  None. FINDINGS: Gallbladder: Partially distended with diffuse gallbladder wall thickening measuring up to 9 mm. No gallstones or sludge. No pericholecystic fluid. No sonographic Shifflett sign noted by sonographer. Common bile duct: Diameter: 5 mm. Liver: No focal lesion identified. Diffusely increased in parenchymal echogenicity. Portal vein is patent on color Doppler imaging with normal direction of blood flow towards the liver. IVC: No abnormality visualized. Pancreas: Majority obscured by bowel gas. Spleen: Size and appearance within normal limits. Right Kidney: Length: 8.0 cm. Thinning of the renal parenchyma. Questionable increased echogenicity. No mass or  hydronephrosis visualized. Left Kidney: Length: 10.6 cm. Echogenicity within normal limits. No mass or hydronephrosis visualized. Abdominal aorta: No aneurysm visualized. Atherosclerotic calcifications noted. Other findings: Bilateral pleural effusions. IMPRESSION: 1. Diffuse gallbladder wall thickening of 9 mm without gallstones or pericholecystic more phi sign. Findings favor systemic causes such as passive congestion or secondary to hepatic causes over primary gallbladder inflammation. No biliary dilatation. 2. Increased hepatic echogenicity suggests steatosis. 3. Small right kidney. 4. Bilateral pleural effusions. Electronically Signed   By: Jeb Levering M.D.   On: 09/16/2017 00:49   Dg Chest Port 1 View  Result Date: 09/16/2017 CLINICAL DATA:  Pulmonary edema EXAM: PORTABLE CHEST 1 VIEW COMPARISON:  Portable exam 0421 hours compared to 09/15/2017 FINDINGS: Upper normal heart size. Stable mediastinal contours. Diffuse pulmonary infiltrates in the mid to lower lungs bilaterally, could represent pulmonary edema or multifocal infection. No gross pleural effusion or pneumothorax. Bones unremarkable. IMPRESSION: Persistent BILATERAL pulmonary infiltrates. Electronically Signed   By: Lavonia Dana M.D.   On: 09/16/2017 07:49   Dg Chest Port 1 View  Result Date: 09/15/2017 CLINICAL DATA:  Shortness of breath. EXAM: PORTABLE CHEST 1 VIEW COMPARISON:  May 23, 2016 FINDINGS: Infiltrates are seen in the lung bases, similar in appearance to more remote studies. The heart size borderline. The hila are normal. No pneumothorax. No nodules or masses. IMPRESSION: Bibasilar infiltrates could represent developing ARDS versus pneumonia. Recommend clinical correlation. Recommend follow-up to resolution. Electronically Signed   By: Dorise Bullion III M.D   On: 09/15/2017 19:57    Cardiac Studies     Patient Profile     62 y.o. female with a history of nonobstructive coronary artery disease and chronic  systolic congestive heart failure.  She presented with marked hypertension and acute respiratory failure.  She has been diuresed and is feeling better.   Assessment & Plan    1.  Acute on Chronic diastolic congestive heart failure: Patient had an echocardiogram yesterday which revealed normal left ventricular systolic function.  The EF was 55-60%.  She has grade 2 diastolic dysfunction.  2.  Elevated troponin level.  I suspect that this is due to demand ischemia. EKG from March 23 does not reveal any ST or T wave changes.  3.  Possible myxoma: The patient was found to have a mass in her LV which appeared to be consistent with a myxoma.     She is scheduled for cardiac MRI .  4.  CKD - cr is 1.85 today    For questions or updates, please contact [facility] Please consult [email or web address] for contact info under Cardiology/STEMI.      Signed, Arnette Norris  Shyann Hefner, MD  09/17/2017, 8:53 AM

## 2017-09-18 ENCOUNTER — Other Ambulatory Visit: Payer: Self-pay

## 2017-09-18 ENCOUNTER — Encounter (HOSPITAL_COMMUNITY): Payer: Self-pay | Admitting: *Deleted

## 2017-09-18 DIAGNOSIS — D151 Benign neoplasm of heart: Secondary | ICD-10-CM

## 2017-09-18 DIAGNOSIS — R748 Abnormal levels of other serum enzymes: Secondary | ICD-10-CM

## 2017-09-18 LAB — CBC
HEMATOCRIT: 35.4 % — AB (ref 36.0–46.0)
Hemoglobin: 10.5 g/dL — ABNORMAL LOW (ref 12.0–15.0)
MCH: 23.3 pg — ABNORMAL LOW (ref 26.0–34.0)
MCHC: 29.7 g/dL — AB (ref 30.0–36.0)
MCV: 78.7 fL (ref 78.0–100.0)
Platelets: 222 10*3/uL (ref 150–400)
RBC: 4.5 MIL/uL (ref 3.87–5.11)
RDW: 16 % — AB (ref 11.5–15.5)
WBC: 12.1 10*3/uL — ABNORMAL HIGH (ref 4.0–10.5)

## 2017-09-18 LAB — BASIC METABOLIC PANEL
Anion gap: 17 — ABNORMAL HIGH (ref 5–15)
BUN: 48 mg/dL — AB (ref 6–20)
CALCIUM: 8.7 mg/dL — AB (ref 8.9–10.3)
CO2: 20 mmol/L — ABNORMAL LOW (ref 22–32)
CREATININE: 2.22 mg/dL — AB (ref 0.44–1.00)
Chloride: 90 mmol/L — ABNORMAL LOW (ref 101–111)
GFR calc non Af Amer: 22 mL/min — ABNORMAL LOW (ref >60)
GFR, EST AFRICAN AMERICAN: 26 mL/min — AB (ref >60)
Glucose, Bld: 114 mg/dL — ABNORMAL HIGH (ref 65–99)
Potassium: 5.1 mmol/L (ref 3.5–5.1)
Sodium: 127 mmol/L — ABNORMAL LOW (ref 135–145)

## 2017-09-18 LAB — HEPARIN LEVEL (UNFRACTIONATED): Heparin Unfractionated: 0.65 IU/mL (ref 0.30–0.70)

## 2017-09-18 MED ORDER — HEPARIN (PORCINE) IN NACL 100-0.45 UNIT/ML-% IJ SOLN
1050.0000 [IU]/h | INTRAMUSCULAR | Status: DC
  Administered 2017-09-18: 1000 [IU]/h via INTRAVENOUS
  Administered 2017-09-20: 1050 [IU]/h via INTRAVENOUS
  Filled 2017-09-18 (×3): qty 250

## 2017-09-18 MED ORDER — BOOST / RESOURCE BREEZE PO LIQD CUSTOM
1.0000 | Freq: Three times a day (TID) | ORAL | Status: DC
  Administered 2017-09-19 (×3): 1 via ORAL

## 2017-09-18 MED ORDER — HEPARIN BOLUS VIA INFUSION
2000.0000 [IU] | Freq: Once | INTRAVENOUS | Status: AC
  Administered 2017-09-18: 2000 [IU] via INTRAVENOUS
  Filled 2017-09-18: qty 2000

## 2017-09-18 MED ORDER — WARFARIN SODIUM 5 MG PO TABS
5.0000 mg | ORAL_TABLET | Freq: Every day | ORAL | Status: DC
  Administered 2017-09-18 – 2017-09-19 (×2): 5 mg via ORAL
  Filled 2017-09-18 (×2): qty 1

## 2017-09-18 MED ORDER — WARFARIN - PHARMACIST DOSING INPATIENT
Freq: Every day | Status: DC
  Administered 2017-09-18: 18:00:00

## 2017-09-18 MED ORDER — ENSURE ENLIVE PO LIQD
237.0000 mL | Freq: Two times a day (BID) | ORAL | Status: DC
  Administered 2017-09-18: 237 mL via ORAL

## 2017-09-18 NOTE — Progress Notes (Signed)
Heparin gtt started at 10 cc/hr (1000 units) per orders and Heparin 2000 unit bolus given per orders.  Pt tolerated well.  No s/s of distress.  Pt encouraged to continue to rest as she has had a headache and nausea today.  Will continue to monitor.

## 2017-09-18 NOTE — Progress Notes (Addendum)
Initial Nutrition Assessment  DOCUMENTATION CODES:   Not applicable  INTERVENTION:    Boost Breeze po TID, each supplement provides 250 kcal and 9 grams of protein  NUTRITION DIAGNOSIS:   Increased nutrient needs related to chronic illness as evidenced by estimated needs  GOAL:   Patient will meet greater than or equal to 90% of their needs  MONITOR:   PO intake, Supplement acceptance, Labs, Weight trends, Skin  REASON FOR ASSESSMENT:   Malnutrition Screening Tool  ASSESSMENT:   62 yo Femalw with history of COPD, paroxysmal atrial fibrillation-declined anticoagulation, chronic diastolic and systolic CHF, hepatitis C, presented to the emergency room with respiratory distress from pulmonary edema, elevated cardiac enzymes, admitted to the ICU on BiPAP treated with diuretics.  RD spoke with pt at bedside. She is very conversational. Reports a decreased appetite. PO intake very poor at 10% per flowsheet records. Reveals she's never been a "big eater". Typically consumes 1-2 meals per day.  RD observed opened Ensure bottle (with drinking straw in it) on tray table.  Pt tried but states it upset her stomach. RD offered clear liquid supplement (ie Boost Breeze).  Pt amenable to tasting Boost Breeze. States "I'm trying to be cooperative".  Pt shares that PTA she's been having headaches with BM's. She also feels "sick as a dog" when she bends over. She reports her Aunt thought this may be gallbladder disease. Pt shares her Grandmother died at age 71 from the disease. Pt also shares that she feels more nauseous than ever before with smells such as her cats litter boxes (she has 5 cats).  Pt lastly shares she's been emotionally stressed. Reveals her Father died last year and her Mother has dementia. She does not know if she's lost weight. States "to be honest I've never kept up with it". Labs and medications reviewed. Na 127 (L).  NUTRITION - FOCUSED PHYSICAL EXAM:    Most  Recent Value  Orbital Region  No depletion  Upper Arm Region  Mild depletion  Thoracic and Lumbar Region  Unable to assess  Buccal Region  No depletion  Temple Region  No depletion  Clavicle Bone Region  Mild depletion  Clavicle and Acromion Bone Region  Mild depletion  Scapular Bone Region  Unable to assess  Dorsal Hand  No depletion  Patellar Region  No depletion  Anterior Thigh Region  No depletion  Posterior Calf Region  No depletion  Edema (RD Assessment)  None    Diet Order:  Diet Heart Room service appropriate? Yes; Fluid consistency: Thin  EDUCATION NEEDS:   No education needs have been identified at this time  Skin:  Skin Assessment: Reviewed RN Assessment  Last BM:  3/26  Height:   Ht Readings from Last 1 Encounters:  09/17/17 5\' 7"  (1.702 m)    Weight:   Wt Readings from Last 1 Encounters:  09/18/17 133 lb 9.6 oz (60.6 kg)    Ideal Body Weight:  61.3 kg  BMI:  Body mass index is 20.92 kg/m.  Estimated Nutritional Needs:   Kcal:  1600-1800  Protein:  75-90 gm  Fluid:  1.6-1.8 L  Arthur Holms, RD, LDN Pager #: [phone] After-Hours Pager #: [phone]

## 2017-09-18 NOTE — Progress Notes (Addendum)
Progress Note  Patient Name: Glenda Johnson Date of Encounter: 09/18/2017  Primary Cardiologist: Gaynell Face ,  Angelena Form  at [facility].      Subjective   62 year old female with a history of nonobstructive coronary artery disease, chronic systolic congestive heart failure, atrial fibrillation, COPD, hypertension who presented to the emergency room with increased shortness of breath.  She was found to have evidence of congestive heart failure and had an elevated troponin level.  She had marked hypertension at the time of admission  She was found to have a mass in the LV apex that appeared c/w a myxoma. Cardiac MRI was done yesterday.   Results pending   Inpatient Medications    Scheduled Meds: . aspirin EC  81 mg Oral Daily  . carvedilol  12.5 mg Oral BID WC  . enoxaparin (LOVENOX) injection  30 mg Subcutaneous Q24H  . feeding supplement (ENSURE ENLIVE)  237 mL Oral BID BM  . gi cocktail  30 mL Oral Once  . mouth rinse  15 mL Mouth Rinse BID   Continuous Infusions: . sodium chloride     PRN Meds: sodium chloride, acetaminophen, hydrALAZINE, ipratropium-albuterol, LORazepam, ondansetron (ZOFRAN) IV, prochlorperazine   Vital Signs    Vitals:   09/18/17 0033 09/18/17 0500 09/18/17 0600 09/18/17 0816  BP: 127/69  135/60   Pulse: (!) 56   64  Resp:      Temp: 98.3 F (36.8 C)  98.1 F (36.7 C)   TempSrc: Rectal  Rectal   SpO2: 100%     Weight:  133 lb 9.6 oz (60.6 kg)    Height:        Intake/Output Summary (Last 24 hours) at 09/18/2017 0830 Last data filed at 09/18/2017 0806 Gross per 24 hour  Intake 1095 ml  Output 150 ml  Net 945 ml   Filed Weights   09/16/17 0530 09/17/17 0400 09/18/17 0500  Weight: 132 lb 7.9 oz (60.1 kg) 143 lb 1.3 oz (64.9 kg) 133 lb 9.6 oz (60.6 kg)    Telemetry     NSR - Personally Reviewed  ECG    NSR  - Personally Reviewed  Physical Exam   GEN: No acute distress.  Sleepy  Neck: No JVD Cardiac: RRR, no murmurs,  rubs, or gallops.  Bilateral breast implants Respiratory: Clear to auscultation bilaterally. GI: Soft, nontender, non-distended  MS: No edema; No deformity. Neuro:  Nonfocal  Psych: Normal affect   Labs    Chemistry Recent Labs  Lab 09/15/17 2005 09/16/17 0445 09/17/17 0237 09/18/17 0238  NA 130* 133* 129* 127*  K 4.2 3.0* 4.6 5.1  CL 97* 96* 94* 90*  CO2 14* 22 22 20*  GLUCOSE 116* 145* 123* 114*  BUN 30* 29* 35* 48*  CREATININE 1.64* 1.56* 1.85* 2.22*  CALCIUM 8.6* 7.8* 8.4* 8.7*  PROT 6.9 5.5* 5.9*  --   ALBUMIN 3.4* 2.7* 3.0*  --   AST 116* 92* 65*  --   ALT 166* 123* 96*  --   ALKPHOS 106 77 84  --   BILITOT 1.9* 2.1* 1.9*  --   GFRNONAA 32* 34* 28* 22*  GFRAA 37* 40* 32* 26*  ANIONGAP 19* 15 13 17*     Hematology Recent Labs  Lab 09/16/17 0445 09/17/17 0237 09/18/17 0238  WBC 9.1 12.3* 12.1*  RBC 3.89 4.25 4.50  HGB 9.2* 10.1* 10.5*  HCT 31.0* 33.7* 35.4*  MCV 79.7 79.3 78.7  MCH 23.7* 23.8* 23.3*  MCHC  29.7* 30.0 29.7*  RDW 15.9* 15.9* 16.0*  PLT 126* 171 222    Cardiac Enzymes Recent Labs  Lab 09/16/17 0128 09/16/17 0445 09/16/17 1036  TROPONINI 8.63* 9.29* 8.99*    Recent Labs  Lab 09/15/17 1906  TROPIPOC 5.50*     BNP Recent Labs  Lab 09/15/17 2006 09/16/17 0445 09/17/17 0237  BNP >4,500.0* >4,500.0* 3,035.3*     DDimer No results for input(s): DDIMER in the last 168 hours.   Radiology    No results found.  Cardiac Studies     Patient Profile     63 wo with chronic diastolic CHF and profound HTN   Assessment & Plan    1.  Acute on Chronic diastolic congestive heart failure: The patient is breathing much better.  Her blood pressure is much better.  Continue current medications  2.  Troponin elevation: This is likely due to her chronic diastolic congestive heart failure in the setting of renal insufficiency.  She has been found to have what appears to be a myxoma in her LV apex.  If she goes for surgical removal of  this myxoma, I would recommend that we proceed with heart catheterization prior to the surgery..  Her creatinine has continued to increase and she would need some hydration and improvement of her renal function prior to heart cath.  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  Cardiac MRI was done yesterday and results are pending.  If she does not fact have an LV mass we will consult CV surgery.  4.  Hypertension: Her blood pressure is better.  5.  Chronic kidney disease: Her creatinine is up to 2.2 today.  She is no longer on diuretics.  For questions or updates, please contact [facility] Please consult [email or web address] for contact info under Cardiology/STEMI.      Signed, Mertie Moores, MD  09/18/2017, 8:30 AM     Addendum:  The cardiac MRI shows the LV apical mass.   Dr. Meda Coffee thinks this could be a thrombus.. Its unclear as to why a thrombus would form there.  Will start heparin / coumadin .   Reassess in 6 weeks.     Mertie Moores, MD  09/18/2017 12:42 PM    [address],  [address] [place], [place]  [address] Pager [phone] Phone: [phone]; Fax: [phone]

## 2017-09-18 NOTE — Care Management Note (Signed)
Case Management Note  Patient Details  Name: Glenda Johnson MRN: [id number] Date of Birth: [birth year]  Subjective/Objective:                 Spoke to patient at the bedside.  She states she was fired from her job 6-7 months ago. She states she lives independently in [place] and follows at [facility] [employer] office. Patient states that she does not currently have home oxygen, a nebulizer or any other DME. Patient reports being able to drive, having a car, and no difficulties affording her medications. She states she has a sister in [place] who is her only nearby relative.    Action/Plan:  CM will continue to follow.   Expected Discharge Date:  09/21/17               Expected Discharge Plan:  Home/Self Care  In-House Referral:     Discharge planning Services  CM Consult  Post Acute Care Choice:    Choice offered to:     DME Arranged:    DME Agency:     HH Arranged:    HH Agency:     Status of Service:  In process, will continue to follow  If discussed at Long Length of Stay Meetings, dates discussed:    Additional Comments:  Carles Collet, RN 09/18/2017, 2:28 PM

## 2017-09-18 NOTE — Progress Notes (Signed)
[facility] for Heparin / Warfarin Indication: LV thrombus  Allergies  Allergen Reactions  . Codeine Anaphylaxis  . Tramadol Anaphylaxis    No hydrocodone or anything connected No hydrocodone or anything connected   . Clarithromycin Nausea And Vomiting  . Citalopram Other (See Comments)    Caused more depression   . Fish Allergy Itching    ears  . Other     Patient says that all narcotics give her anaphylaxis.   . Wellbutrin [Bupropion] Other (See Comments)    "could not speak a full sentence for a whole year"    Patient Measurements: Height: 5\' 7"  (170.2 cm) Weight: 133 lb 9.6 oz (60.6 kg) IBW/kg (Calculated) : 61.6   Vital Signs: Temp: 98.4 F (36.9 C) (03/26 2125) Temp Source: Oral (03/26 2125) BP: 129/62 (03/26 2125) Pulse Rate: 63 (03/26 2125)  Labs: Recent Labs    09/16/17 0128  09/16/17 0445 09/16/17 0604 09/16/17 1036 09/17/17 0237 09/18/17 0238 09/18/17 2134  HGB  --    < > 9.2*  --   --  10.1* 10.5*  --   HCT  --   --  31.0*  --   --  33.7* 35.4*  --   PLT  --   --  126*  --   --  171 222  --   HEPARINUNFRC  --   --   --  0.15*  --   --   --  0.65  CREATININE  --   --  1.56*  --   --  1.85* 2.22*  --   TROPONINI 8.63*  --  9.29*  --  8.99*  --   --   --    < > = values in this interval not displayed.    Estimated Creatinine Clearance: 24.8 mL/min (A) (by C-G formula based on SCr of 2.22 mg/dL (H)).   Medical History: Past Medical History:  Diagnosis Date  . Renal artery stenosis ([place])   . Secondary hypertension   . Tobacco abuse     Assessment: 62 year old female to begin heparin / warfarin for LV thrombus. Last dose of sq Lovenox at 12:30 pm today  Heparin level this evening is therapeutic on heparin at 1000 units/hr.  No overt bleeding or complications noted.  Goal of Therapy:  Heparin level 0.3-0.7 units/ml Monitor platelets by anticoagulation protocol: Yes  INR = 2 to 3   Plan:  Heparin drip  at 1000 units / hr Confirm heparin level with AM labs. Daily heparin level, CBC, INR  Uvaldo Rising, BCPS  Clinical Pharmacist Pager [phone]  09/18/2017 10:27 PM

## 2017-09-18 NOTE — Progress Notes (Signed)
Pt stated she can now see better and no longer having blurry vision at this time. Pt is able to read the sign across the room which states "Call Don't Fall".

## 2017-09-18 NOTE — Progress Notes (Signed)
Pt complaining of blurred vision in both eyes.  Pt stated this has been going on for 2 weeks but seems to be getting worse since being admitted.  Vital signs stable. Rapid Response called to assess pt.  Lamar Blinks, NP notified. Will continue monitor.

## 2017-09-18 NOTE — Progress Notes (Signed)
PROGRESS NOTE    Glenda Johnson  UKG:[id number] DOB: 1956-01-05 DOA: 09/15/2017 PCP: Paris Lore, MD  Brief Narrative: 62 year old female history of COPD, paroxysmal atrial fibrillation-declined anticoagulation, chronic diastolic and systolic CHF, hepatitis C, presented to the emergency room with respiratory distress from pulmonary edema, elevated cardiac enzymes, admitted to the ICU on BiPAP treated with diuretics. 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 Myxoma -Cardiology following  CAD status post NSTEMI/ Versus demand ischemia -Cards following, off heparin -Felt to be demand related from CHF -per cardiology  Blurry vision/Mild left leg numbness -Motor strength seems to be equal in left leg, and light touch is intact however patient reports a difference in sensation in her left leg -check MRI brain, given LV thrombus vs Myxoma  COPD/tobacco abuse -Stable, no wheezing noted, nebs PRN  AKI on CKd stage II -significant bump in creatinine noted, likely secondary to diuretics -Stopped Lasix and Aldactone -Encouraged PO intake -monitor  History of hepatitis C -s/p Rx, Fu with GI at [facility]  DVT prophylaxis: add lovenox Code Status: FUll Code Family Communication: none at bedside Disposition Plan: Tx to tele  Consultants:   Cards   Procedures:   Antimicrobials:     Subjective: -complains of blurring of vision reportedly for a few days where she was in the ICU -Related change in sensation in the left leg -Has intermittent chronic nausea -Breathing is improving   Objective: Vitals:   09/18/17 0033 09/18/17 0500 09/18/17 0600 09/18/17 0816  BP: 127/69  135/60   Pulse: (!) 56   64  Resp:      Temp: 98.3 F (36.8 C)  98.1 F (36.7 C)   TempSrc: Rectal  Rectal   SpO2: 100%     Weight:  60.6 kg (133 lb 9.6 oz)    Height:        Intake/Output Summary (Last 24 hours) at 09/18/2017 1223 Last data filed at 09/18/2017 0806 Gross per 24 hour  Intake 975 ml  Output 150 ml  Net 825 ml   Filed Weights   09/16/17 0530 09/17/17 0400 09/18/17 0500  Weight: 60.1 kg (132 lb 7.9 oz) 64.9 kg (143 lb 1.3 oz) 60.6 kg (133 lb 9.6 oz)    Examination:  Gen: Awake, Alert, Oriented X 3, chronically ill female HEENT: PERRLA, no JVD Lungs: Good air movement bilaterally, CTAB CVS: S1S2/RRR Abd: soft, Non tender, non distended, BS present Extremities: No Cyanosis, Clubbing or edema Skin: no new rashes Central nervous system: Alert and oriented. No focal neurological deficits. Psychiatry: Judgement and insight appear normal. Mood & affect appropriate.     Data Reviewed:   CBC: Recent Labs  Lab 09/15/17 1941 09/15/17 2005 09/16/17 0445 09/17/17 0237 09/18/17 0238  WBC  --  12.3* 9.1 12.3* 12.1*  NEUTROABS  --  9.1* 6.0  --   --   HGB 14.6 11.2* 9.2* 10.1* 10.5*  HCT 43.0 38.4 31.0* 33.7* 35.4*  MCV  --  81.9 79.7 79.3 78.7  PLT  --  166 126* 171 601   Basic Metabolic Panel: Recent Labs  Lab 09/15/17 1941 09/15/17 2005 09/16/17 0128 09/16/17 0445 09/17/17 0237 09/18/17 0238  NA 132* 130*  --  133* 129* 127*  K 4.3 4.2  --  3.0* 4.6 5.1  CL 100* 97*  --  96* 94* 90*  CO2  --  14*  --  22 22 20*  GLUCOSE 120* 116*  --  145* 123* 114*  BUN 33* 30*  --  29* 35* 48*  CREATININE 1.50* 1.64*  --  1.56* 1.85* 2.22*  CALCIUM  --  8.6*  --   7.8* 8.4* 8.7*  MG  --   --  2.0 1.8 2.1  --   PHOS  --   --  3.1 2.9 2.8  --    GFR: Estimated Creatinine Clearance: 24.8 mL/min (A) (by C-G formula based on SCr of 2.22 mg/dL (H)). Liver Function Tests: Recent Labs  Lab 09/15/17 2005 09/16/17 0445 09/17/17 0237  AST 116* 92* 65*  ALT 166* 123* 96*  ALKPHOS 106 77 84  BILITOT 1.9* 2.1* 1.9*  PROT 6.9 5.5* 5.9*  ALBUMIN 3.4* 2.7* 3.0*   Recent Labs  Lab 09/16/17 0128  LIPASE 34   No results for input(s): AMMONIA in the last 168 hours. Coagulation Profile: No results for input(s): INR, PROTIME in the last 168 hours. Cardiac Enzymes: Recent Labs  Lab 09/16/17 0128 09/16/17 0445 09/16/17 1036  TROPONINI 8.63* 9.29* 8.99*   BNP (last 3 results) No results for input(s): PROBNP in the last 8760 hours. HbA1C: No results for input(s): HGBA1C in the last 72 hours. CBG: No results for input(s): GLUCAP in the last 168 hours. Lipid Profile: No results for input(s): CHOL, HDL, LDLCALC, TRIG, CHOLHDL, LDLDIRECT in the last 72 hours. Thyroid Function Tests: No results for input(s): TSH, T4TOTAL, FREET4, T3FREE, THYROIDAB in the last 72 hours. Anemia Panel: No results for input(s): VITAMINB12, FOLATE, FERRITIN, TIBC, IRON, RETICCTPCT in the last 72 hours. Urine analysis:    Component Value Date/Time   COLORURINE YELLOW 09/16/2017 0040   APPEARANCEUR CLEAR 09/16/2017 0040   LABSPEC 1.009 09/16/2017 0040   PHURINE 6.0 09/16/2017 0040   GLUCOSEU NEGATIVE 09/16/2017 0040   HGBUR SMALL (A) 09/16/2017 0040   BILIRUBINUR NEGATIVE 09/16/2017 0040   KETONESUR NEGATIVE 09/16/2017 0040   PROTEINUR 100 (A) 09/16/2017 0040   NITRITE NEGATIVE 09/16/2017 0040   LEUKOCYTESUR NEGATIVE 09/16/2017 0040   Sepsis Labs: @LABRCNTIP (procalcitonin:4,lacticidven:4)  ) Recent Results (from the past 240 hour(s))  Blood Culture (routine x 2)     Status: None (Preliminary result)   Collection Time: 09/15/17  7:50 PM  Result Value Ref Range  Status   Specimen Description BLOOD BLOOD LEFT FOREARM  Final   Special Requests   Final    BOTTLES DRAWN AEROBIC AND ANAEROBIC Blood Culture adequate volume   Culture   Final    NO GROWTH 3 DAYS Performed at [facility], [place] [address], [place],  [address]    Report Status PENDING  Incomplete  Blood Culture (routine x 2)     Status: None (Preliminary result)   Collection Time: 09/15/17  8:00 PM  Result Value Ref Range Status   Specimen Description BLOOD BLOOD RIGHT HAND  Final   Special Requests   Final    BOTTLES DRAWN AEROBIC AND ANAEROBIC Blood Culture adequate volume   Culture   Final    NO GROWTH 3 DAYS Performed at  [facility], [place] [address] [place], [place] [address]    Report Status PENDING  Incomplete  MRSA PCR Screening     Status: None   Collection Time: 09/16/17 12:36 AM  Result Value Ref Range Status   MRSA by PCR NEGATIVE NEGATIVE Final    Comment:        The GeneXpert MRSA Assay (FDA approved for NASAL specimens only), is one component of a comprehensive MRSA colonization surveillance program. It is not intended to diagnose MRSA infection nor to guide or monitor treatment for MRSA infections. Performed at [facility], [place] [address] [place], [place] [address]          Radiology Studies: No results found.      Scheduled Meds: . aspirin EC  81 mg Oral Daily  . carvedilol  12.5 mg Oral BID WC  . enoxaparin (LOVENOX) injection  30 mg Subcutaneous Q24H  . feeding supplement (ENSURE ENLIVE)  237 mL Oral BID BM  . gi cocktail  30 mL Oral Once  . mouth rinse  15 mL Mouth Rinse BID   Continuous Infusions: . sodium chloride       LOS: 3 days    Time spent: 69min    Domenic Polite, MD [facility] Page via [email or web address], password [email or web address] After 7PM please contact night-coverage  09/18/2017, 12:23 PM

## 2017-09-18 NOTE — Progress Notes (Signed)
ANTICOAGULATION CONSULT NOTE - Initial Consult  Pharmacy Consult for Heparin / Warfarin Indication: LV thrombus  Allergies  Allergen Reactions  . Codeine Anaphylaxis  . Tramadol Anaphylaxis    No hydrocodone or anything connected No hydrocodone or anything connected   . Clarithromycin Nausea And Vomiting  . Citalopram Other (See Comments)    Caused more depression   . Fish Allergy Itching    ears  . Other     Patient says that all narcotics give her anaphylaxis.   . Wellbutrin [Bupropion] Other (See Comments)    "could not speak a full sentence for a whole year"    Patient Measurements: Height: 5\' 7"  (170.2 cm) Weight: 133 lb 9.6 oz (60.6 kg) IBW/kg (Calculated) : 61.6   Vital Signs: Temp: 98.1 F (36.7 C) (03/26 0600) Temp Source: Rectal (03/26 0600) BP: 135/60 (03/26 0600) Pulse Rate: 64 (03/26 0816)  Labs: Recent Labs    09/16/17 0128 09/16/17 0445 09/16/17 0604 09/16/17 1036 09/17/17 0237 09/18/17 0238  HGB  --  9.2*  --   --  10.1* 10.5*  HCT  --  31.0*  --   --  33.7* 35.4*  PLT  --  126*  --   --  171 222  HEPARINUNFRC  --   --  0.15*  --   --   --   CREATININE  --  1.56*  --   --  1.85* 2.22*  TROPONINI 8.63* 9.29*  --  8.99*  --   --     Estimated Creatinine Clearance: 24.8 mL/min (A) (by C-G formula based on SCr of 2.22 mg/dL (H)).   Medical History: Past Medical History:  Diagnosis Date  . Renal artery stenosis ([place])   . Secondary hypertension   . Tobacco abuse     Assessment: 62 year old female to begin heparin / warfarin for LV thrombus. Last dose of sq Lovenox at 12:30 pm today  Goal of Therapy:  Heparin level 0.3-0.7 units/ml Monitor platelets by anticoagulation protocol: Yes  INR = 2 to 3   Plan:  Warfarin 5 mg po daily at 1800 pm Heparin 2000 unit iv bolus x 1 Heparin drip at 1000 units / hr 8 hour heparin level Daily heparin level, CBC, INR  Thank you Anette Guarneri, PharmD 09/18/2017,12:53 PM

## 2017-09-19 ENCOUNTER — Inpatient Hospital Stay (HOSPITAL_COMMUNITY): Payer: BLUE CROSS/BLUE SHIELD

## 2017-09-19 DIAGNOSIS — I519 Heart disease, unspecified: Secondary | ICD-10-CM

## 2017-09-19 DIAGNOSIS — I5030 Unspecified diastolic (congestive) heart failure: Secondary | ICD-10-CM

## 2017-09-19 LAB — BASIC METABOLIC PANEL
Anion gap: 12 (ref 5–15)
BUN: 49 mg/dL — AB (ref 6–20)
CALCIUM: 8.5 mg/dL — AB (ref 8.9–10.3)
CO2: 23 mmol/L (ref 22–32)
CREATININE: 1.93 mg/dL — AB (ref 0.44–1.00)
Chloride: 94 mmol/L — ABNORMAL LOW (ref 101–111)
GFR, EST AFRICAN AMERICAN: 31 mL/min — AB (ref >60)
GFR, EST NON AFRICAN AMERICAN: 26 mL/min — AB (ref >60)
Glucose, Bld: 94 mg/dL (ref 65–99)
Potassium: 4.4 mmol/L (ref 3.5–5.1)
SODIUM: 129 mmol/L — AB (ref 135–145)

## 2017-09-19 LAB — ECHOCARDIOGRAM LIMITED
Height: 67 in
WEIGHTICAEL: 2151.69 [oz_av]

## 2017-09-19 LAB — PROTIME-INR
INR: 1.66
PROTHROMBIN TIME: 19.5 s — AB (ref 11.4–15.2)

## 2017-09-19 LAB — LIPID PANEL
CHOL/HDL RATIO: 5.7 ratio
Cholesterol: 63 mg/dL (ref 0–200)
HDL: 11 mg/dL — ABNORMAL LOW (ref >40)
LDL Cholesterol: 41 mg/dL (ref 0–99)
Triglycerides: 57 mg/dL (ref <150)
VLDL: 11 mg/dL (ref 0–40)

## 2017-09-19 LAB — HEPARIN LEVEL (UNFRACTIONATED)
HEPARIN UNFRACTIONATED: 0.3 [IU]/mL (ref 0.30–0.70)
HEPARIN UNFRACTIONATED: 0.43 [IU]/mL (ref 0.30–0.70)

## 2017-09-19 LAB — CBC
HEMATOCRIT: 32.2 % — AB (ref 36.0–46.0)
Hemoglobin: 9.5 g/dL — ABNORMAL LOW (ref 12.0–15.0)
MCH: 23.1 pg — AB (ref 26.0–34.0)
MCHC: 29.5 g/dL — ABNORMAL LOW (ref 30.0–36.0)
MCV: 78.3 fL (ref 78.0–100.0)
Platelets: 247 10*3/uL (ref 150–400)
RBC: 4.11 MIL/uL (ref 3.87–5.11)
RDW: 16.2 % — AB (ref 11.5–15.5)
WBC: 9.3 10*3/uL (ref 4.0–10.5)

## 2017-09-19 LAB — TROPONIN I: Troponin I: 3.29 ng/mL (ref <0.03)

## 2017-09-19 LAB — HEMOGLOBIN A1C
HEMOGLOBIN A1C: 5.3 % (ref 4.8–5.6)
Mean Plasma Glucose: 105.41 mg/dL

## 2017-09-19 MED ORDER — WARFARIN SODIUM 3 MG PO TABS
3.0000 mg | ORAL_TABLET | Freq: Once | ORAL | Status: AC
  Administered 2017-09-19: 3 mg via ORAL
  Filled 2017-09-19: qty 1

## 2017-09-19 MED ORDER — SODIUM CHLORIDE 0.9 % IV SOLN
INTRAVENOUS | Status: DC
  Administered 2017-09-19: 13:00:00 via INTRAVENOUS

## 2017-09-19 MED ORDER — PERFLUTREN LIPID MICROSPHERE
1.0000 mL | INTRAVENOUS | Status: AC | PRN
  Administered 2017-09-19: 2 mL via INTRAVENOUS
  Filled 2017-09-19: qty 10

## 2017-09-19 NOTE — Progress Notes (Signed)
ANTICOAGULATION CONSULT NOTE - [place] for Heparin / Warfarin Indication: LV thrombus  Allergies  Allergen Reactions  . Codeine Anaphylaxis  . Tramadol Anaphylaxis    No hydrocodone or anything connected No hydrocodone or anything connected   . Clarithromycin Nausea And Vomiting  . Citalopram Other (See Comments)    Caused more depression   . Fish Allergy Itching    ears  . Other     Patient says that all narcotics give her anaphylaxis.   . Wellbutrin [Bupropion] Other (See Comments)    "could not speak a full sentence for a whole year"    Patient Measurements: Height: 5\' 7"  (170.2 cm) Weight: 134 lb 7.7 oz (61 kg) IBW/kg (Calculated) : 61.6   Vital Signs: Temp: 98.3 F (36.8 C) (03/27 0450) Temp Source: Oral (03/27 0450) BP: 124/63 (03/27 0450) Pulse Rate: 60 (03/27 0450)  Labs: Recent Labs    09/16/17 1036  09/17/17 0237 09/18/17 0238 09/18/17 2134 09/19/17 0314  HGB  --    < > 10.1* 10.5*  --  9.5*  HCT  --   --  33.7* 35.4*  --  32.2*  PLT  --   --  171 222  --  247  LABPROT  --   --   --   --   --  19.5*  INR  --   --   --   --   --  1.66  HEPARINUNFRC  --   --   --   --  0.65 0.30  CREATININE  --   --  1.85* 2.22*  --   --   TROPONINI 8.99*  --   --   --   --   --    < > = values in this interval not displayed.    Estimated Creatinine Clearance: 25 mL/min (A) (by C-G formula based on SCr of 2.22 mg/dL (H)).   Medical History: Past Medical History:  Diagnosis Date  . Renal artery stenosis ([place])   . Secondary hypertension   . Tobacco abuse     Assessment: Glenda Johnson on Heparin/Warfarin for anticoagulation with LV thrombus identified on cardiac work-up.  Heparin level this morning is therapeutic but on the lower end of the range (HL 0.3 << 0.65, goal of 0.3-0.7). INR today remains SUBtherapeutic after starting warfarin yesterday (INR 1.66, no baseline to compare to). Hgb/Hct slight drop, plts wnl. No bleeding noted at  this time.   Goal of Therapy:  INR 2-3 Heparin level 0.3-0.7 units/ml Monitor platelets by anticoagulation protocol: Yes    Plan:  - Increase Heparin to 1050 units/hr (10.5 ml/hr) to keep within range - Warfarin 3 mg x 1 dose at 1800 today - Will continue to monitor for any signs/symptoms of bleeding and will follow up with heparin level and PT/INR in the a.m.   Thank you for allowing pharmacy to be a part of this patient's care.  Alycia Rossetti, PharmD, BCPS Clinical Pharmacist Pager: [phone] Clinical phone for 09/19/2017 from 7a-3:30p: [phone] If after 3:30p, please call main pharmacy at: [phone] 09/19/2017 8:49 AM

## 2017-09-19 NOTE — Progress Notes (Signed)
Patient was very nauseated and having multiple episodes of trying to throw up when transport team came to get her for MRI.  Will try again this morning, pending that patient is feeling more stable.

## 2017-09-19 NOTE — Progress Notes (Signed)
ANTICOAGULATION CONSULT NOTE - Glenda Johnson for Heparin / Warfarin Indication: LV thrombus  Allergies  Allergen Reactions  . Codeine Anaphylaxis  . Tramadol Anaphylaxis    No hydrocodone or anything connected No hydrocodone or anything connected   . Clarithromycin Nausea And Vomiting  . Citalopram Other (See Comments)    Caused more depression   . Fish Allergy Itching    ears  . Other     Patient says that all narcotics give her anaphylaxis.   . Wellbutrin [Bupropion] Other (See Comments)    "could not speak a full sentence for a whole year"    Patient Measurements: Height: 5\' 7"  (170.2 cm) Weight: 134 lb 7.7 oz (61 kg) IBW/kg (Calculated) : 61.6   Vital Signs: Temp: 97.9 F (36.6 C) (03/27 1535) Temp Source: Oral (03/27 1535) BP: 119/62 (03/27 1535) Pulse Rate: 58 (03/27 1535)  Labs: Recent Labs    09/17/17 0237 09/18/17 0238 09/18/17 2134 09/19/17 0314 09/19/17 0913 09/19/17 1836  HGB 10.1* 10.5*  --  9.5*  --   --   HCT 33.7* 35.4*  --  32.2*  --   --   PLT 171 222  --  247  --   --   LABPROT  --   --   --  19.5*  --   --   INR  --   --   --  1.66  --   --   HEPARINUNFRC  --   --  0.65 0.30  --  0.43  CREATININE 1.85* 2.22*  --   --  1.93*  --   TROPONINI  --   --   --   --  3.29*  --     Estimated Creatinine Clearance: 28.7 mL/min (A) (by C-G formula based on SCr of 1.93 mg/dL (H)).   Medical History: Past Medical History:  Diagnosis Date  . Renal artery stenosis ([place])   . Secondary hypertension   . Tobacco abuse     Assessment: 47 YOF on Heparin/Warfarin for anticoagulation with LV thrombus identified on cardiac work-up.  Heparin level this evening is at goal (0.4) Hgb/Hct slight drop, plts wnl. No bleeding noted at this time.   Goal of Therapy:  INR 2-3 Heparin level 0.3-0.7 units/ml Monitor platelets by anticoagulation protocol: Yes    Plan:  - Continue Heparin at 1050 units/hr (10.5 ml/hr) - Daily INR and  heparin level  Thank you for allowing pharmacy to be a part of this patient's care.  Erin Hearing PharmD., BCPS Clinical Pharmacist 09/19/2017 7:50 PM

## 2017-09-19 NOTE — Evaluation (Signed)
Physical Therapy Evaluation Patient Details Name: Glenda Johnson MRN: [id number] DOB: 20-Jun-1956 Today's Date: 09/19/2017   History of Present Illness  Pt is a 62 y/o female with PMH of COPD, atrial fibrillation (non compliant with anticoagulation), HTN, Hep C, and diastolic heart failure admitted on 3/23 with acute respiratory failure due to pulmonary edema and hypertensive emergency. She was admitted to the ICU on BiPAP and improved with IV diuretics. Echocardiogram was performed on 3/24 and noted a 2.5 x 1.8 cm mobile mass in the left ventricle. 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 The following day, patient complained of worsening bilateral blurry vision. MRI brain today revealed multiple acute infarcts bilaterally consistent with cerebral emboli.     Clinical Impression  Pt presented supine in bed with HOB elevated, initially asleep but easily aroused; however, remained lethargic throughout. No family/caregivers present to confirm history information provided by pt. Prior to admission, pt reported that she was independent with functional mobility and ADLs. Pt stated that she lives alone and has a sister that helps her with her cats. Per RN, pt's sister stated that she has to provide increasingly more assistance to pt. Pt currently requires min A for bed mobility, min guard for transfers and was only able to take two side steps at EOB. Pt very fearful and unable to release the bed rail to allow for further gait assessment. Pt would continue to benefit from skilled physical therapy services at this time while admitted and after d/c to address the below listed limitations in order to improve overall safety and independence with functional mobility.     Follow Up Recommendations SNF;Supervision/Assistance - 24 hour    Equipment Recommendations  None recommended by PT    Recommendations for  Other Services       Precautions / Restrictions Precautions Precautions: Fall Restrictions Weight Bearing Restrictions: No      Mobility  Bed Mobility Overal bed mobility: Needs Assistance Bed Mobility: Rolling;Sidelying to Sit;Sit to Sidelying Rolling: Min assist Sidelying to sit: Min assist     Sit to sidelying: Min guard General bed mobility comments: increased time and effort, multimodal cueing for sequencing, assist with bilateral LE movement off of bed and for trunk elevation  Transfers Overall transfer level: Needs assistance Equipment used: 1 person hand held assist Transfers: Sit to/from Stand Sit to Stand: Min guard         General transfer comment: increased time, min guard for safety, 1HHA on L and R hand on bed rail. pt unable to release hold of bed railing  Ambulation/Gait             General Gait Details: pt only able to take two lateral steps at EOB; pt fearful and unwilling to release grip on bed rail to attempt further gait training  Stairs            Wheelchair Mobility    Modified Rankin (Stroke Patients Only) Modified Rankin (Stroke Patients Only) Pre-Morbid Rankin Score: Slight disability Modified Rankin: Moderately severe disability     Balance Overall balance assessment: Needs assistance Sitting-balance support: Feet supported Sitting balance-Leahy Scale: Fair     Standing balance support: During functional activity;Bilateral upper extremity supported Standing balance-Leahy Scale: Poor  Pertinent Vitals/Pain Pain Assessment: No/denies pain    Home Living Family/patient expects to be discharged to:: Private residence Living Arrangements: Alone Available Help at Discharge: Family;Available PRN/intermittently Type of Home: House Home Access: Stairs to enter Entrance Stairs-Rails: Psychiatric nurse of Steps: "a few" Home Layout: One level Home Equipment:  None Additional Comments: information provided by pt, no family/caregivers present to confirm information    Prior Function Level of Independence: Independent         Comments: information provided by pt, no family/caregivers present to confirm information     Hand Dominance        Extremity/Trunk Assessment   Upper Extremity Assessment Upper Extremity Assessment: Overall WFL for tasks assessed    Lower Extremity Assessment Lower Extremity Assessment: Overall WFL for tasks assessed       Communication   Communication: No difficulties  Cognition Arousal/Alertness: Lethargic Behavior During Therapy: Flat affect Overall Cognitive Status: No family/caregiver present to determine baseline cognitive functioning Area of Impairment: Following commands;Safety/judgement;Problem solving                       Following Commands: Follows one step commands consistently Safety/Judgement: Decreased awareness of safety   Problem Solving: Slow processing;Decreased initiation;Difficulty sequencing;Requires verbal cues        General Comments      Exercises     Assessment/Plan    PT Assessment Patient needs continued PT services  PT Problem List Decreased strength;Decreased activity tolerance;Decreased balance;Decreased mobility;Decreased coordination;Decreased knowledge of use of DME;Decreased safety awareness;Decreased knowledge of precautions       PT Treatment Interventions DME instruction;Gait training;Stair training;Functional mobility training;Therapeutic activities;Therapeutic exercise;Balance training;Neuromuscular re-education;Patient/family education    PT Goals (Current goals can be found in the Care Plan section)  Acute Rehab PT Goals Patient Stated Goal: return home PT Goal Formulation: With patient Time For Goal Achievement: 10/03/17 Potential to Achieve Goals: Good    Frequency Min 3X/week   Barriers to discharge        Co-evaluation                AM-PAC PT "6 Clicks" Daily Activity  Outcome Measure Difficulty turning over in bed (including adjusting bedclothes, sheets and blankets)?: Unable Difficulty moving from lying on back to sitting on the side of the bed? : Unable Difficulty sitting down on and standing up from a chair with arms (e.g., wheelchair, bedside commode, etc,.)?: Unable Help needed moving to and from a bed to chair (including a wheelchair)?: A Little Help needed walking in hospital room?: A Little Help needed climbing 3-5 steps with a railing? : A Lot 6 Click Score: 11    End of Session Equipment Utilized During Treatment: Gait belt Activity Tolerance: Patient limited by fatigue;Patient limited by lethargy Patient left: in bed;with call bell/phone within reach;with bed alarm set Nurse Communication: Mobility status PT Visit Diagnosis: Other abnormalities of gait and mobility (R26.89)    Time: 7829-5621 PT Time Calculation (min) (ACUTE ONLY): 12 min   Charges:   PT Evaluation $PT Eval Low Complexity: 1 Low     PT G Codes:        [place], PT, [place] 09/19/2017, 4:49 PM

## 2017-09-19 NOTE — Progress Notes (Signed)
PROGRESS NOTE    Glenda Johnson  EHU:[id number] DOB: [birth year] DOA: 09/15/2017 PCP: Paris Lore, MD  Brief Narrative: 62 year old female history of COPD, paroxysmal atrial fibrillation-declined anticoagulation, chronic diastolic and systolic CHF, hepatitis C, presented to the emergency room with respiratory distress from pulmonary edema, elevated cardiac enzymes, admitted to the ICU on BiPAP treated with diuretics. 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 -continue IV heparin and coumadin -MRI done due to blurring of vision in setting of LV thrombus -Neuro consult -risk of hemorrhage lower given size of infarct but risk of no anticoagulation is higher  CAD status post NSTEMI/ Versus demand ischemia -Cards following, off heparin -Felt to be demand related from CHF -per cardiology  COPD/tobacco abuse -Stable, no wheezing noted, nebs PRN  AKI on CKd stage II -significant bump in creatinine noted, likely secondary to diuretics -Stopped Lasix and Aldactone -Encouraged PO intake -improving with holding diuretics  History of hepatitis C -s/p Rx, Fu with GI at [facility]  DVT prophylaxis: add lovenox Code Status: FUll Code Family Communication: none at bedside Disposition Plan: Tx to tele  Consultants:   Cards   Procedures:   Antimicrobials:    Subjective: -feels tired, no new  complaints  Objective: Vitals:   09/18/17 1630 09/18/17 1811 09/18/17 2125 09/19/17 0450  BP: (!) 143/68  129/62 124/63  Pulse: (!) 55 72 63 60  Resp: 18  16 16   Temp: 98.1 F (36.7 C)  98.4 F (36.9 C) 98.3 F (36.8 C)  TempSrc: Oral  Oral Oral  SpO2: 98%  96% 94%  Weight:    61 kg (134 lb 7.7 oz)  Height:        Intake/Output Summary (Last 24 hours) at 09/19/2017 1221 Last data filed at 09/19/2017 0900 Gross per 24 hour  Intake 305.67 ml  Output -  Net 305.67 ml   Filed Weights   09/17/17 0400 09/18/17 0500 09/19/17 0450  Weight: 64.9 kg (143 lb 1.3 oz) 60.6 kg (133 lb 9.6 oz) 61 kg (134 lb 7.7 oz)    Examination:  Gen: Awake, Alert, Oriented X 2, somnolent, chronically ill female HEENT: PERRLA, no JVD Lungs: poor air movement, but clear bilaterally CVS: S1S2/RRR Abd: soft, Non tender, non distended, BS present Extremities: No Cyanosis, Clubbing or edema Skin: no new rashes Central nervous system: Alert and orientedx2 Motor 4+/5, sensory light touch intact, DTR 2plus, plantars withdrawal Psychiatry: flat affect    Data Reviewed:   CBC: Recent Labs  Lab 09/15/17 2005 09/16/17 0445 09/17/17 0237 09/18/17 0238 09/19/17 0314  WBC 12.3* 9.1 12.3* 12.1* 9.3  NEUTROABS 9.1* 6.0  --   --   --   HGB 11.2* 9.2* 10.1* 10.5* 9.5*  HCT 38.4 31.0* 33.7* 35.4* 32.2*  MCV 81.9 79.7 79.3 78.7 78.3  PLT 166 126* 171 222 785   Basic Metabolic Panel: Recent Labs  Lab 09/15/17 2005 09/16/17 0128 09/16/17 0445 09/17/17 0237 09/18/17 0238 09/19/17 0913  NA 130*  --  133* 129*  127* 129*  K 4.2  --  3.0* 4.6 5.1 4.4  CL 97*  --  96* 94* 90* 94*  CO2 14*  --  22 22 20* 23  GLUCOSE 116*  --  145* 123* 114* 94  BUN 30*  --  29* 35* 48* 49*  CREATININE 1.64*  --  1.56* 1.85* 2.22* 1.93*  CALCIUM 8.6*  --  7.8* 8.4* 8.7* 8.5*  MG  --  2.0 1.8 2.1  --   --   PHOS  --  3.1 2.9 2.8  --   --    GFR: Estimated Creatinine Clearance: 28.7 mL/min (A) (by C-G formula based  on SCr of 1.93 mg/dL (H)). Liver Function Tests: Recent Labs  Lab 09/15/17 2005 09/16/17 0445 09/17/17 0237  AST 116* 92* 65*  ALT 166* 123* 96*  ALKPHOS 106 77 84  BILITOT 1.9* 2.1* 1.9*  PROT 6.9 5.5* 5.9*  ALBUMIN 3.4* 2.7* 3.0*   Recent Labs  Lab 09/16/17 0128  LIPASE 34   No results for input(s): AMMONIA in the last 168 hours. Coagulation Profile: Recent Labs  Lab 09/19/17 0314  INR 1.66   Cardiac Enzymes: Recent Labs  Lab 09/16/17 0128 09/16/17 0445 09/16/17 1036 09/19/17 0913  TROPONINI 8.63* 9.29* 8.99* 3.29*   BNP (last 3 results) No results for input(s): PROBNP in the last 8760 hours. HbA1C: No results for input(s): HGBA1C in the last 72 hours. CBG: No results for input(s): GLUCAP in the last 168 hours. Lipid Profile: No results for input(s): CHOL, HDL, LDLCALC, TRIG, CHOLHDL, LDLDIRECT in the last 72 hours. Thyroid Function Tests: No results for input(s): TSH, T4TOTAL, FREET4, T3FREE, THYROIDAB in the last 72 hours. Anemia Panel: No results for input(s): VITAMINB12, FOLATE, FERRITIN, TIBC, IRON, RETICCTPCT in the last 72 hours. Urine analysis:    Component Value Date/Time   COLORURINE YELLOW 09/16/2017 0040   APPEARANCEUR CLEAR 09/16/2017 0040   LABSPEC 1.009 09/16/2017 0040   PHURINE 6.0 09/16/2017 0040   GLUCOSEU NEGATIVE 09/16/2017 0040   HGBUR SMALL (A) 09/16/2017 0040   BILIRUBINUR NEGATIVE 09/16/2017 0040   KETONESUR NEGATIVE 09/16/2017 0040   PROTEINUR 100 (A) 09/16/2017 0040   NITRITE NEGATIVE 09/16/2017 0040   LEUKOCYTESUR NEGATIVE 09/16/2017 0040   Sepsis Labs: @LABRCNTIP (procalcitonin:4,lacticidven:4)  ) Recent Results (from the past 240 hour(s))  Blood Culture (routine x 2)     Status: None (Preliminary result)   Collection Time: 09/15/17  7:50 PM  Result Value Ref Range Status   Specimen Description BLOOD BLOOD LEFT FOREARM  Final   Special Requests   Final    BOTTLES DRAWN AEROBIC AND ANAEROBIC Blood Culture adequate  volume   Culture   Final    NO GROWTH 4 DAYS Performed at [facility], [place] [address] [place], [place] [address]    Report Status PENDING  Incomplete  Blood Culture (routine x 2)     Status: None (Preliminary result)   Collection Time: 09/15/17  8:00 PM  Result Value Ref Range Status   Specimen Description BLOOD BLOOD RIGHT HAND  Final   Special Requests   Final    BOTTLES DRAWN AEROBIC AND ANAEROBIC Blood Culture adequate volume   Culture   Final    NO GROWTH 4 DAYS Performed at [facility], [place] [address], [place], [place] [address]    Report Status PENDING  Incomplete  MRSA PCR Screening     Status: None   Collection Time: 09/16/17 12:36 AM  Result  Value Ref Range Status   MRSA by PCR NEGATIVE NEGATIVE Final    Comment:        The GeneXpert MRSA Assay (FDA approved for NASAL specimens only), is one component of a comprehensive MRSA colonization surveillance program. It is not intended to diagnose MRSA infection nor to guide or monitor treatment for MRSA infections. Performed at [facility], [place] [address], [place], [place] [address]          Radiology Studies: Mr Brain Wo Contrast  Result Date: 09/19/2017 CLINICAL DATA:  Stroke EXAM: MRI HEAD WITHOUT CONTRAST TECHNIQUE: Multiplanar, multiecho pulse sequences of the brain and surrounding structures were obtained without intravenous contrast. COMPARISON:  None. FINDINGS: Brain: Multiple acute infarcts are present bilaterally. Largest infarct in the right occipital lobe. Numerous small cortical and subcortical infarcts in both cerebral hemispheres. Small acute infarcts left cerebellum. Negative for hemorrhage or mass. Ventricle size normal. No midline shift. Minimal if any chronic ischemia. Vascular: Normal arterial flow voids Skull and upper cervical spine: Negative Sinuses/Orbits: Negative Other: None IMPRESSION: Multiple acute infarcts bilaterally consistent with cerebral emboli. No midline shift or  hemorrhage. Electronically Signed   By: Franchot Gallo M.D.   On: 09/19/2017 11:40        Scheduled Meds: . aspirin EC  81 mg Oral Daily  . carvedilol  12.5 mg Oral BID WC  . feeding supplement  1 Container Oral TID BM  . gi cocktail  30 mL Oral Once  . mouth rinse  15 mL Mouth Rinse BID  . warfarin  3 mg Oral ONCE-1800  . warfarin  5 mg Oral q1800  . Warfarin - Pharmacist Dosing Inpatient   Does not apply q1800   Continuous Infusions: . sodium chloride    . heparin 1,050 Units/hr (09/19/17 1158)     LOS: 4 days    Time spent: 74min    Domenic Polite, MD [facility] Page via [email or web address], password [email or web address] After 7PM please contact night-coverage  09/19/2017, 12:21 PM

## 2017-09-19 NOTE — Progress Notes (Signed)
    I have discussed imaging of her LV mass with Dr. Meda Coffee. Will gently hydrate her overnight in an attempt to improve her creatinine so that we can do an MRI with Gadolinium contrast. This will help [place] determine whether or not this is a myxoma vs. Thrombus.    Mertie Moores, MD  09/19/2017 1:16 PM    [facility],  [place], [place]  [address] Pager [phone] Phone: [phone]; Fax: [phone]

## 2017-09-19 NOTE — Consult Note (Addendum)
Neurology Consultation  Reason for Consult: Stroke evaluation Referring Physician: Dr. Broadus John   CC: Blurry vision   History is obtained entirely from chart review.   HPI: Glenda Johnson is a 62 y.o. female with a pmhx of COPD, atrial fibrillation (non compliant with anticoagulation), HTN, Hep C, and diastolic heart failure admitted on 3/23 with acute respiratory failure due to pulmonary edema and hypertensive emergency. She was admitted to the ICU on BiPAP and improved with IV diuretics. Echocardiogram was performed on 3/24 and noted a 2.5 x 1.8 cm mobile mass in the left ventricle. 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 The following day, patient complained of worsening bilateral blurry vision. MRI brain today revealed multiple acute infarcts bilaterally consistent with cerebral emboli.    LKW: Unknown  tpa given?: no NIHSS: 4 Premorbid modified Rankin scale (mRS): Unknown    ROS: Unable to obtain due to altered mental status.   Past Medical History:  Diagnosis Date  . Renal artery stenosis ([place])   . Secondary hypertension   . Tobacco abuse    Family History  Problem Relation Age of Onset  . Diabetes Maternal Grandmother    Social History:   reports that she has been smoking.  She has never used smokeless tobacco. She reports that she does not drink alcohol or use drugs.  Medications  Current Facility-Administered Medications:  .  0.9 %  sodium chloride infusion, 250 mL, Intravenous, PRN, Sood, Vineet, MD .  0.9 %  sodium chloride infusion, , Intravenous, Continuous, Nahser, Wonda Cheng, MD, Last Rate: 50 mL/hr at 09/19/17 1324 .  acetaminophen (TYLENOL) tablet 650 mg, 650 mg, Oral, Q4H PRN, Chesley Mires, MD, 650 mg at 09/16/17 1533 .  aspirin EC tablet 81 mg, 81 mg, Oral, Daily, Chesley Mires, MD, 81 mg at 09/19/17 1157 .  carvedilol (COREG) tablet 12.5 mg, 12.5 mg, Oral,  BID WC, Sood, Vineet, MD, 12.5 mg at 09/19/17 1157 .  feeding supplement (BOOST / RESOURCE BREEZE) liquid 1 Container, 1 Container, Oral, TID BM, Domenic Polite, MD, 1 Container at 09/19/17 1324 .  gi cocktail (Maalox,Lidocaine,Donnatal), 30 mL, Oral, Once, Sood, Vineet, MD .  heparin ADULT infusion 100 units/mL (25000 units/234mL sodium chloride 0.45%), 1,050 Units/hr, Intravenous, Continuous, Rolla Flatten, [facility], Last Rate: 10.5 mL/hr at 09/19/17 1158, 1,050 Units/hr at 09/19/17 1158 .  hydrALAZINE (APRESOLINE) injection 10 mg, 10 mg, Intravenous, Q4H PRN, Chesley Mires, MD, 10 mg at 09/15/17 2233 .  ipratropium-albuterol (DUONEB) 0.5-2.5 (3) MG/3ML nebulizer solution 3 mL, 3 mL, Nebulization, Q4H PRN, Halford Chessman, Vineet, MD .  LORazepam (ATIVAN) tablet 0.5 mg, 0.5 mg, Oral, Q6H PRN, Chesley Mires, MD, 0.5 mg at 09/19/17 0039 .  MEDLINE mouth rinse, 15 mL, Mouth Rinse, BID, Hammonds, Sharyn Blitz, MD, 15 mL at 09/19/17 1158 .  ondansetron (ZOFRAN) injection 4 mg, 4 mg, Intravenous, Q6H PRN, Chesley Mires, MD, 4 mg at 09/18/17 2056 .  prochlorperazine (COMPAZINE) injection 10 mg, 10 mg, Intravenous, Q4H PRN, Chesley Mires, MD, 10 mg at 09/16/17 2107 .  warfarin (COUMADIN) tablet 3 mg, 3 mg, Oral, ONCE-1800, Rolla Flatten, [facility] .  warfarin (COUMADIN) tablet 5 mg, 5 mg, Oral, q1800, Domenic Polite, MD, 5 mg at 09/18/17 1811 .  Warfarin - Pharmacist Dosing Inpatient, , Does not apply, q1800, Domenic Polite, MD  Exam: Current vital signs: BP 124/63 (BP Location: Right Arm)   Pulse 60  Temp 98.3 F (36.8 C) (Oral)   Resp 16   Ht 5\' 7"  (1.702 m)   Wt 134 lb 7.7 oz (61 kg)   SpO2 94%   BMI 21.06 kg/m  Vital signs in last 24 hours: Temp:  [98.1 F (36.7 C)-98.4 F (36.9 C)] 98.3 F (36.8 C) (03/27 0450) Pulse Rate:  [55-72] 60 (03/27 0450) Resp:  [16-18] 16 (03/27 0450) BP: (124-143)/(62-68) 124/63 (03/27 0450) SpO2:  [94 %-98 %] 94 % (03/27 0450) Weight:  [134 lb 7.7 oz (61 kg)] 134 lb  7.7 oz (61 kg) (03/27 0450)  GENERAL: Somnolent, awakes to sternal rub  HEENT: - Normocephalic and atraumatic, dry mm, no LN++, no Thyromegally LUNGS - Snoring, breathing comfortably on RA ABDOMEN - Soft, nontender, nondistended with normoactive BS Ext: warm, well perfused, intact peripheral pulses, no edema   NEURO:  Mental Status: Somnolent but alert & oriented x 3 Language: speech is slurred.  Naming, repetition, fluency, and comprehension intact. Cranial Nerves: PERRL 3 mm/brisk. no facial asymmetry, facial sensation intact, hearing intact, tongue/uvula/soft palate midline, No evidence of tongue atrophy or fibrillations Motor: Limited due to poor participation but moves all extremities against gravity, no focal deficits Tone: is normal and bulk is normal Sensation- Unable to assess due to AMS Coordination: Unable to assess Gait- deferred  NIHSS 4  Labs I have reviewed labs in epic and the results pertinent to this consultation are:  CBC    Component Value Date/Time   WBC 9.3 09/19/2017 0314   RBC 4.11 09/19/2017 0314   HGB 9.5 (L) 09/19/2017 0314   HCT 32.2 (L) 09/19/2017 0314   PLT 247 09/19/2017 0314   MCV 78.3 09/19/2017 0314   MCH 23.1 (L) 09/19/2017 0314   MCHC 29.5 (L) 09/19/2017 0314   RDW 16.2 (H) 09/19/2017 0314   LYMPHSABS 2.1 09/16/2017 0445   MONOABS 1.0 09/16/2017 0445   EOSABS 0.0 09/16/2017 0445   BASOSABS 0.0 09/16/2017 0445    CMP     Component Value Date/Time   NA 129 (L) 09/19/2017 0913   K 4.4 09/19/2017 0913   CL 94 (L) 09/19/2017 0913   CO2 23 09/19/2017 0913   GLUCOSE 94 09/19/2017 0913   BUN 49 (H) 09/19/2017 0913   CREATININE 1.93 (H) 09/19/2017 0913   CALCIUM 8.5 (L) 09/19/2017 0913   PROT 5.9 (L) 09/17/2017 0237   ALBUMIN 3.0 (L) 09/17/2017 0237   AST 65 (H) 09/17/2017 0237   ALT 96 (H) 09/17/2017 0237   ALKPHOS 84 09/17/2017 0237   BILITOT 1.9 (H) 09/17/2017 0237   GFRNONAA 26 (L) 09/19/2017 0913   GFRAA 31 (L) 09/19/2017 0913     Lipid Panel     Component Value Date/Time   CHOL 63 09/19/2017 1310   TRIG 57 09/19/2017 1310   HDL 11 (L) 09/19/2017 1310   CHOLHDL 5.7 09/19/2017 1310   VLDL 11 09/19/2017 1310   LDLCALC 41 09/19/2017 1310     Imaging I have reviewed the images obtained:  09/19/16 MRI examination of the brain: IMPRESSION: Multiple acute infarcts bilaterally consistent with cerebral emboli. No midline shift or hemorrhage.  3/25 Cardiac MRI: IMPRESSION: 1. Normal left ventricular size, thickness and systolic function (LVEF = 55-60%). There are no regional wall motion abnormalities.  There is a large pedunculated mobile mass located at the left ventricular apex attached to the apical septal wall. The mass measures 26 x 23 x 16 mm. There are no wall motion abnormalities in the left ventricular  segments around the mass. The patient didn't receive gadolinium contrast that would help with tissue characterization. T1 with and without fat sat suggest no or low fat content. This could represent a myxoma, a thrombus can't be excluded.  Additional early and late gadolinium enhancement images are recommended once GFR improves > 30 ml/min.  Assessment: 62 yo F with pmhx of COPD, atrial fibrillation (non compliant with anticoagulation), HTN, Hep C, and diastolic heart failure admitted on 3/23 with acute respiratory failure due to pulmonary edema and hypertensive emergency. Echocardiogram revealed left ventricular mobile mass, follow up cardiac MRI concerning for thrombus. Was started on heparin drip and Warfarin by cardiology.  Patient developed bilateral blurry vision. MRI brain demonstrated multiple acute infarcts bilaterally consistent with emboli.    Impression:  Embolic infarcts secondary to left ventricular thrombus vs myxoma  Fluctuating Hypersomnolence - question hypercarbia from COPD, ?OSA.  Recommendations: -- Although there is moderate risk for hemorraghic conversion, anticoagulation  is reasonable given high risk of another embolic stroke in the setting of possible LV thrombus -- Continue heparin to warfarin bridge; discontinue heparin when INR 1.8 -- Use low intensity heparin ( PTT goal  of 60-80 or 0.3 -05) -- Goal INR for Warfarin is  2-3  -- Appreciate cardiologist assistance; planning repeat MRI with gadolinium tomorrow to reassess LV mass -- PT / OT -- Tele -- Consider ABG, optimization of COPD  -- Carotid doppler -- NIHSS, neurochecks -- Consider EEG if there is waxing/waning of mental status -- avoid hypotension     Glenda Johnson, M.D. - PGY2 09/19/2017, 2:54 PM     NEUROHOSPITALIST ADDENDUM Seen and examined the patient today. Patient alert and on phone when I spoke to her. I reviewed the history, exam, and recommendations by PAC/Resident with few editions.    Stroke team to follow.   Glenda Addison Aroor MD [facility] [phone]  If 7pm to 7am, please call on call as listed on AMION.

## 2017-09-19 NOTE — Progress Notes (Signed)
Progress Note  Patient Name: Glenda Johnson Date of Encounter: 09/19/2017  Primary Cardiologist: Gaynell Face ,  Angelena Form  at [facility].      Subjective   62 year old female with a history of nonobstructive coronary artery disease, chronic systolic congestive heart failure, atrial fibrillation, COPD, hypertension who presented to the emergency room with increased shortness of breath.  She was found to have evidence of congestive heart failure and had an elevated troponin level.  She had marked hypertension at the time of admission  She was found to have a mass in the LV apex that appeared c/w a myxoma. MRI suggests the mass is c/w thrombus.    Ive discussed the case with Dr. Meda Coffee.   We will repeat a limited echo with definity contrast to see if it further defines the mass.   Inpatient Medications    Scheduled Meds: . aspirin EC  81 mg Oral Daily  . carvedilol  12.5 mg Oral BID WC  . feeding supplement  1 Container Oral TID BM  . gi cocktail  30 mL Oral Once  . mouth rinse  15 mL Mouth Rinse BID  . warfarin  5 mg Oral q1800  . Warfarin - Pharmacist Dosing Inpatient   Does not apply q1800   Continuous Infusions: . sodium chloride    . heparin 1,000 Units/hr (09/18/17 1326)   PRN Meds: sodium chloride, acetaminophen, hydrALAZINE, ipratropium-albuterol, LORazepam, ondansetron (ZOFRAN) IV, prochlorperazine   Vital Signs    Vitals:   09/18/17 1630 09/18/17 1811 09/18/17 2125 09/19/17 0450  BP: (!) 143/68  129/62 124/63  Pulse: (!) 55 72 63 60  Resp: 18  16 16   Temp: 98.1 F (36.7 C)  98.4 F (36.9 C) 98.3 F (36.8 C)  TempSrc: Oral  Oral Oral  SpO2: 98%  96% 94%  Weight:    134 lb 7.7 oz (61 kg)  Height:        Intake/Output Summary (Last 24 hours) at 09/19/2017 0817 Last data filed at 09/18/2017 2000 Gross per 24 hour  Intake 185.67 ml  Output -  Net 185.67 ml   Filed Weights   09/17/17 0400 09/18/17 0500 09/19/17 0450  Weight: 143 lb 1.3 oz  (64.9 kg) 133 lb 9.6 oz (60.6 kg) 134 lb 7.7 oz (61 kg)    Telemetry    NSR  - Personally Reviewed  ECG      NSR  - Personally Reviewed  Physical Exam   Physical Exam: Blood pressure 124/63, pulse 60, temperature 98.3 F (36.8 C), temperature source Oral, resp. rate 16, height 5\' 7"  (1.702 m), weight 134 lb 7.7 oz (61 kg), SpO2 94 %.  GEN:  Well nourished, well developed in no acute distress HEENT: Normal NECK: No JVD; No carotid bruits LYMPHATICS: No lymphadenopathy CARDIAC: RR, no murmurs, rubs, gallops RESPIRATORY:  Clear to auscultation without rales, wheezing or rhonchi  ABDOMEN: Soft, non-tender, non-distended MUSCULOSKELETAL:  No edema; No deformity  SKIN: Warm and dry NEUROLOGIC:  Alert and oriented x 3   Labs    Chemistry Recent Labs  Lab 09/15/17 2005 09/16/17 0445 09/17/17 0237 09/18/17 0238  NA 130* 133* 129* 127*  K 4.2 3.0* 4.6 5.1  CL 97* 96* 94* 90*  CO2 14* 22 22 20*  GLUCOSE 116* 145* 123* 114*  BUN 30* 29* 35* 48*  CREATININE 1.64* 1.56* 1.85* 2.22*  CALCIUM 8.6* 7.8* 8.4* 8.7*  PROT 6.9 5.5* 5.9*  --   ALBUMIN 3.4* 2.7* 3.0*  --  AST 116* 92* 65*  --   ALT 166* 123* 96*  --   ALKPHOS 106 77 84  --   BILITOT 1.9* 2.1* 1.9*  --   GFRNONAA 32* 34* 28* 22*  GFRAA 37* 40* 32* 26*  ANIONGAP 19* 15 13 17*     Hematology Recent Labs  Lab 09/17/17 0237 09/18/17 0238 09/19/17 0314  WBC 12.3* 12.1* 9.3  RBC 4.25 4.50 4.11  HGB 10.1* 10.5* 9.5*  HCT 33.7* 35.4* 32.2*  MCV 79.3 78.7 78.3  MCH 23.8* 23.3* 23.1*  MCHC 30.0 29.7* 29.5*  RDW 15.9* 16.0* 16.2*  PLT 171 222 247    Cardiac Enzymes Recent Labs  Lab 09/16/17 0128 09/16/17 0445 09/16/17 1036  TROPONINI 8.63* 9.29* 8.99*    Recent Labs  Lab 09/15/17 1906  TROPIPOC 5.50*     BNP Recent Labs  Lab 09/15/17 2006 09/16/17 0445 09/17/17 0237  BNP >4,500.0* >4,500.0* 3,035.3*     DDimer No results for input(s): DDIMER in the last 168 hours.   Radiology    No  results found.  Cardiac Studies     Patient Profile     71 wo with chronic diastolic CHF and profound HTN   Assessment & Plan    1.  Acute on Chronic diastolic congestive heart failure: breathing is better  Continue meds   2.  Troponin elevation: This is likely due to her chronic diastolic congestive heart failure in the setting of renal insufficiency.  She has been found to have what appears to be a myxoma in her LV apex.  If she goes for surgical removal of this myxoma, I would recommend that we proceed with heart catheterization prior to the surgery..  Her creatinine has continued to increase and she would need some hydration and improvement of her renal function prior to heart cath.  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  MRI was done - could not give Gad because of her CKD.   Repeating echo today with Definity contrast .   4.  Hypertension: Her blood pressure is better.  5.  Chronic kidney disease: Her creatinine is up to 2.2 yesterday .  BMP pending for today   For questions or updates, please contact [place] Please consult [email or web address] for contact info under Cardiology/STEMI.      Signed, Mertie Moores, MD  09/19/2017, 8:17 AM     Addendum:  The cardiac MRI shows the LV apical mass.   Dr. Meda Coffee thinks this could be a thrombus.. Its unclear as to why a thrombus would form there.  Will start heparin / coumadin .   Reassess in 6 weeks.     Mertie Moores, MD  09/19/2017 8:17 AM    [place],  [place], [place]  [address] Pager [phone] Phone: [phone]; Fax: [phone]

## 2017-09-20 ENCOUNTER — Inpatient Hospital Stay (HOSPITAL_COMMUNITY): Payer: BLUE CROSS/BLUE SHIELD

## 2017-09-20 ENCOUNTER — Encounter (HOSPITAL_COMMUNITY): Payer: Self-pay | Admitting: *Deleted

## 2017-09-20 DIAGNOSIS — I5189 Other ill-defined heart diseases: Secondary | ICD-10-CM

## 2017-09-20 DIAGNOSIS — I6330 Cerebral infarction due to thrombosis of unspecified cerebral artery: Secondary | ICD-10-CM

## 2017-09-20 DIAGNOSIS — Z7901 Long term (current) use of anticoagulants: Secondary | ICD-10-CM

## 2017-09-20 DIAGNOSIS — I509 Heart failure, unspecified: Secondary | ICD-10-CM

## 2017-09-20 DIAGNOSIS — I639 Cerebral infarction, unspecified: Secondary | ICD-10-CM

## 2017-09-20 DIAGNOSIS — I10 Essential (primary) hypertension: Secondary | ICD-10-CM

## 2017-09-20 DIAGNOSIS — I5030 Unspecified diastolic (congestive) heart failure: Secondary | ICD-10-CM

## 2017-09-20 DIAGNOSIS — R222 Localized swelling, mass and lump, trunk: Secondary | ICD-10-CM

## 2017-09-20 DIAGNOSIS — I2129 ST elevation (STEMI) myocardial infarction involving other sites: Secondary | ICD-10-CM

## 2017-09-20 DIAGNOSIS — I6340 Cerebral infarction due to embolism of unspecified cerebral artery: Secondary | ICD-10-CM

## 2017-09-20 DIAGNOSIS — J449 Chronic obstructive pulmonary disease, unspecified: Secondary | ICD-10-CM

## 2017-09-20 LAB — BASIC METABOLIC PANEL
ANION GAP: 9 (ref 5–15)
BUN: 43 mg/dL — AB (ref 6–20)
CO2: 23 mmol/L (ref 22–32)
Calcium: 7.9 mg/dL — ABNORMAL LOW (ref 8.9–10.3)
Chloride: 97 mmol/L — ABNORMAL LOW (ref 101–111)
Creatinine, Ser: 1.83 mg/dL — ABNORMAL HIGH (ref 0.44–1.00)
GFR, EST AFRICAN AMERICAN: 33 mL/min — AB (ref >60)
GFR, EST NON AFRICAN AMERICAN: 28 mL/min — AB (ref >60)
Glucose, Bld: 114 mg/dL — ABNORMAL HIGH (ref 65–99)
Potassium: 4 mmol/L (ref 3.5–5.1)
SODIUM: 129 mmol/L — AB (ref 135–145)

## 2017-09-20 LAB — ECHOCARDIOGRAM LIMITED
Height: 67 in
Weight: 2158.74 oz

## 2017-09-20 LAB — CULTURE, BLOOD (ROUTINE X 2)
Culture: NO GROWTH
Culture: NO GROWTH
SPECIAL REQUESTS: ADEQUATE
Special Requests: ADEQUATE

## 2017-09-20 LAB — HEPARIN LEVEL (UNFRACTIONATED): HEPARIN UNFRACTIONATED: 0.33 [IU]/mL (ref 0.30–0.70)

## 2017-09-20 LAB — PROTIME-INR
INR: 3.07
Prothrombin Time: 31.4 seconds — ABNORMAL HIGH (ref 11.4–15.2)

## 2017-09-20 MED ORDER — PANTOPRAZOLE SODIUM 40 MG PO TBEC
40.0000 mg | DELAYED_RELEASE_TABLET | Freq: Every day | ORAL | Status: DC
  Administered 2017-09-21 – 2017-09-24 (×3): 40 mg via ORAL
  Filled 2017-09-20 (×3): qty 1

## 2017-09-20 MED ORDER — PERFLUTREN LIPID MICROSPHERE
1.0000 mL | INTRAVENOUS | Status: AC | PRN
  Administered 2017-09-20: 2.5 mL via INTRAVENOUS
  Filled 2017-09-20: qty 10

## 2017-09-20 MED ORDER — SODIUM CHLORIDE 0.9 % IV BOLUS: 500.0000 mL | Freq: Once | INTRAVENOUS | Status: DC

## 2017-09-20 MED ORDER — SODIUM CHLORIDE 0.9 % IV SOLN
INTRAVENOUS | Status: DC
  Administered 2017-09-20: 04:00:00 via INTRAVENOUS

## 2017-09-20 NOTE — Progress Notes (Signed)
PROGRESS NOTE    MADILYNNE MULLAN  QAS:[id number] DOB: 1955-07-27 DOA: 09/15/2017 PCP: Paris Lore, MD  Brief Narrative: 62 year old female history of COPD, paroxysmal atrial fibrillation-declined anticoagulation, chronic diastolic and systolic CHF, hepatitis C, presented to the emergency room with respiratory distress from pulmonary edema, elevated cardiac enzymes, admitted to the ICU on BiPAP treated with diuretics. 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 -completed Cardiac MRI -started IV heparin and coumadin 3/26 per Cards -cardiology following, INR 3 today, stopped Heparin and hold coumadin dose today -CVTS consulted by Cards  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 -treated with IV heparin and coumadin, INR 3 today, stopped Heparin and hold coumadin dose today -MRI done due to blurring of vision in setting of LV thrombus -Neuro consult appreciated -risk of hemorrhage lower given size of infarct but risk of no anticoagulation is higher  CAD status post NSTEMI/ Versus demand ischemia -Cards following, off heparin -Felt to be demand related from CHF -per cardiology  COPD/tobacco abuse -Stable, no wheezing noted, nebs PRN  AKI on CKd stage II -significant bump in creatinine noted, likely secondary to diuretics -Stopped Lasix and Aldactone -Encouraged PO intake -improving with holding diuretics down from 2.2 to 1.8 now  History of hepatitis C -s/p Rx, Fu  with GI at [facility]  DVT prophylaxis: add lovenox Code Status: FUll Code Family Communication: none at bedside Disposition Plan: not medically stable for SNF yet, next week  Consultants:   Cards   Procedures:   Antimicrobials:    Subjective: -feels tired, no specific complaints, upset abt not being told that breakfast was delivered, no dyspnea, " im a mess"  Objective: Vitals:   09/20/17 0100 09/20/17 0300 09/20/17 0700 09/20/17 0927  BP: 114/65  135/65 135/70  Pulse:   (!) 56 60  Resp:   18   Temp:   97.7 F (36.5 C)   TempSrc:   Axillary   SpO2:   96%   Weight:  61.2 kg (134 lb 14.7 oz)    Height:        Intake/Output Summary (Last 24 hours) at 09/20/2017 1341 Last data filed at 09/20/2017 0947 Gross per 24 hour  Intake 2279.77 ml  Output 600 ml  Net 1679.77 ml   Filed Weights   09/18/17 0500 09/19/17 0450 09/20/17 0300  Weight: 60.6 kg (133 lb 9.6 oz) 61 kg (134 lb 7.7 oz) 61.2 kg (134 lb 14.7 oz)    Examination:  Gen: somnolent, arousable, no distress, awakens and answers questions appropriately  HEENT: PERRLA, Neck supple, no JVD Lungs: Good air movement bilaterally, CTAB CVS: RRR,No Gallops,Rubs or new Murmurs Abd: soft, Non tender, non distended, BS present Extremities: no edema Motor 4+/5, sensory light touch intact, DTR 2plus, plantars withdrawal Psychiatry: flat affect    Data Reviewed:   CBC: Recent Labs  Lab 09/15/17 2005 09/16/17 0445 09/17/17 0237 09/18/17 0238 09/19/17 0314  WBC 12.3* 9.1 12.3* 12.1* 9.3  NEUTROABS 9.1* 6.0  --   --   --   HGB 11.2* 9.2* 10.1* 10.5* 9.5*  HCT 38.4 31.0* 33.7* 35.4* 32.2*  MCV 81.9 79.7 79.3 78.7 78.3  PLT 166 126* 171 222 063   Basic Metabolic Panel: Recent Labs  Lab 09/16/17 0128 09/16/17 0445 09/17/17 0237 09/18/17 0238 09/19/17 0913 09/20/17 0319  NA  --  133* 129* 127* 129* 129*  K  --  3.0* 4.6 5.1 4.4 4.0  CL  --  96* 94* 90* 94* 97*  CO2  --  22 22 20* 23 23  GLUCOSE  --  145*  123* 114* 94 114*  BUN  --  29* 35* 48* 49* 43*  CREATININE  --  1.56* 1.85* 2.22* 1.93* 1.83*  CALCIUM  --  7.8* 8.4* 8.7* 8.5* 7.9*  MG 2.0 1.8 2.1  --   --   --   PHOS 3.1 2.9 2.8  --   --   --    GFR: Estimated Creatinine Clearance: 30.4 mL/min (A) (by C-G formula based on SCr of 1.83 mg/dL (H)). Liver Function Tests: Recent Labs  Lab 09/15/17 2005 09/16/17 0445 09/17/17 0237  AST 116* 92* 65*  ALT 166* 123* 96*  ALKPHOS 106 77 84  BILITOT 1.9* 2.1* 1.9*  PROT 6.9 5.5* 5.9*  ALBUMIN 3.4* 2.7* 3.0*   Recent Labs  Lab 09/16/17 0128  LIPASE 34   No results for input(s): AMMONIA in the last 168 hours. Coagulation Profile: Recent Labs  Lab 09/19/17 0314 09/20/17 0906  INR 1.66 3.07   Cardiac Enzymes: Recent Labs  Lab 09/16/17 0128 09/16/17 0445 09/16/17 1036 09/19/17 0913  TROPONINI 8.63* 9.29* 8.99* 3.29*   BNP (last 3 results) No results for input(s): PROBNP in the last 8760 hours. HbA1C: Recent Labs    09/19/17 1310  HGBA1C 5.3   CBG: No results for input(s): GLUCAP in the last 168 hours. Lipid Profile: Recent Labs    09/19/17 1310  CHOL 63  HDL 11*  LDLCALC 41  TRIG 57  CHOLHDL 5.7   Thyroid Function Tests: No results for input(s): TSH, T4TOTAL, FREET4, T3FREE, THYROIDAB in the last 72 hours. Anemia Panel: No results for input(s): VITAMINB12, FOLATE, FERRITIN, TIBC, IRON, RETICCTPCT in the last 72 hours. Urine analysis:    Component Value Date/Time   COLORURINE YELLOW 09/16/2017 0040   APPEARANCEUR CLEAR 09/16/2017 0040   LABSPEC 1.009 09/16/2017 0040   PHURINE 6.0 09/16/2017 0040   GLUCOSEU NEGATIVE 09/16/2017 0040   HGBUR SMALL (A) 09/16/2017 0040   BILIRUBINUR NEGATIVE 09/16/2017 0040   KETONESUR NEGATIVE 09/16/2017 0040   PROTEINUR 100 (A) 09/16/2017 0040   NITRITE NEGATIVE 09/16/2017 0040   LEUKOCYTESUR NEGATIVE 09/16/2017 0040   Sepsis Labs: @LABRCNTIP (procalcitonin:4,lacticidven:4)  ) Recent Results (from the past 240  hour(s))  Blood Culture (routine x 2)     Status: None   Collection Time: 09/15/17  7:50 PM  Result Value Ref Range Status   Specimen Description BLOOD BLOOD LEFT FOREARM  Final   Special Requests   Final    BOTTLES DRAWN AEROBIC AND ANAEROBIC Blood Culture adequate volume   Culture   Final    NO GROWTH 5 DAYS Performed at [facility], [place] [address], [place], [place] [address]    Report Status 09/20/2017 FINAL  Final  Blood Culture (routine x 2)     Status: None   Collection Time: 09/15/17  8:00 PM  Result Value Ref Range Status   Specimen Description BLOOD BLOOD RIGHT HAND  Final   Special Requests   Final    BOTTLES DRAWN  AEROBIC AND ANAEROBIC Blood Culture adequate volume   Culture   Final    NO GROWTH 5 DAYS Performed at [facility], [place] [address], [place], [place] [address]    Report Status 09/20/2017 FINAL  Final  MRSA PCR Screening     Status: None   Collection Time: 09/16/17 12:36 AM  Result Value Ref Range Status   MRSA by PCR NEGATIVE NEGATIVE Final    Comment:        The GeneXpert MRSA Assay (FDA approved for NASAL specimens only), is one component of a comprehensive MRSA colonization surveillance program. It is not intended to diagnose MRSA infection nor to guide or monitor treatment for MRSA infections. Performed at [facility], [place] [address], [place], [place] [address]          Radiology Studies: Ct Head Wo Contrast  Result Date: 09/20/2017 CLINICAL DATA:  62 y/o  F; numbness, tingling, and paresthesias. EXAM: CT HEAD WITHOUT CONTRAST TECHNIQUE: Contiguous axial images were obtained from the base of the skull through the vertex without intravenous contrast. COMPARISON:  09/19/2017 MRI of the head. FINDINGS: Brain: Small focus of infarction within the right occipitotemporal junction and multiple small foci of infarction throughout the cerebral hemispheres and left cerebellum are stable in comparison with the prior MRI given  differences in technique. No significant associated mass effect. No hemorrhage identified. No new acute intracranial abnormality. No effacement of basilar cisterns, hydrocephalus, or extra-axial collection. Vascular: No hyperdense vessel or unexpected calcification. Skull: Normal. Negative for fracture or focal lesion. Sinuses/Orbits: No acute finding. Other: None. IMPRESSION: 1. Right occipitotemporal junction infarction as well as multiple additional small infarctions throughout the cerebral hemispheres and left cerebellum are stable in comparison with the prior MRI given differences in technique. No associated hemorrhage or mass effect. 2. No new acute intracranial abnormality identified. Electronically Signed   By: Kristine Garbe M.D.   On: 09/20/2017 01:04   Mr Brain Wo Contrast  Result Date: 09/19/2017 CLINICAL DATA:  Stroke EXAM: MRI HEAD WITHOUT CONTRAST TECHNIQUE: Multiplanar, multiecho pulse sequences of the brain and surrounding structures were obtained without intravenous contrast. COMPARISON:  None. FINDINGS: Brain: Multiple acute infarcts are present bilaterally. Largest infarct in the right occipital lobe. Numerous small cortical and subcortical infarcts in both cerebral hemispheres. Small acute infarcts left cerebellum. Negative for hemorrhage or mass. Ventricle size normal. No midline shift. Minimal if any chronic ischemia. Vascular: Normal arterial flow voids Skull and upper cervical spine: Negative Sinuses/Orbits: Negative Other: None IMPRESSION: Multiple acute infarcts bilaterally consistent with cerebral emboli. No midline shift or hemorrhage. Electronically Signed   By: Franchot Gallo M.D.   On: 09/19/2017 11:40        Scheduled Meds: . aspirin EC  81 mg Oral Daily  . carvedilol  12.5 mg Oral BID WC  . feeding supplement  1 Container Oral TID BM  . gi cocktail  30 mL Oral Once  . mouth rinse  15 mL Mouth Rinse BID  . pantoprazole  40 mg Oral Q1200  . Warfarin -  Pharmacist Dosing Inpatient   Does not apply q1800   Continuous Infusions: . sodium chloride    . sodium chloride 75 mL/hr at 09/20/17 0408  . sodium chloride       LOS: 5 days    Time spent: 29min    Domenic Polite, MD [facility] Page via [email or web address], password [email or web address] After 7PM please contact night-coverage  09/20/2017, 1:41 PM

## 2017-09-20 NOTE — Evaluation (Signed)
Clinical/Bedside Swallow Evaluation Patient Details  Name: Glenda Johnson MRN: [id number] Date of Birth: 1955-12-29  Today's Date: 09/20/2017 Time: SLP Start Time (ACUTE ONLY): 1005(earlier session interrupted, added times together) SLP Stop Time (ACUTE ONLY): 1029 SLP Time Calculation (min) (ACUTE ONLY): 24 min  Past Medical History:  Past Medical History:  Diagnosis Date  . Renal artery stenosis ([place])   . Secondary hypertension   . Tobacco abuse    Past Surgical History:  Past Surgical History:  Procedure Laterality Date  . BACK SURGERY    . CARDIAC CATHETERIZATION N/A 05/25/2016   Procedure: Left Heart Cath and Coronary Angiography;  Surgeon: Lorretta Harp, MD;  Location: [facility];  Service: Cardiovascular;  Laterality: N/A;  . stent renal artery     HPI:  62 yo female adm to [facility] with respiratory failure. PMH + for HEpC, heart failure, COPD, + smoker, CT head showed right occipital junction, left cerebellar CVA and cerebral hemipshere cvas.  Pt found to have bilateral infiltrates per CXR 09/16/17.  Pt denies dysphagia.     Assessment / Plan / Recommendation Clinical Impression  Pt presents with functional oropharyngeal swallow ability without indication of airway compromise with all po observed *soda, water, eggs, bacon, toast and pudding.  Swallow appeared timely with clear voice post intake.  Pt readily passed 3 ounce water test without deficits.    Pt denies any issues with swallowing or reflux - does admit to issues with nausea.  Recommend continue diet as tolerated. No SlP follow up indicated.  SLP Visit Diagnosis: Dysphagia, unspecified (R13.10)    Aspiration Risk  No limitations    Diet Recommendation Regular;Thin liquid   Liquid Administration via: Cup;Straw Medication Administration: Whole meds with liquid Supervision: Patient able to self feed Compensations: Slow rate;Small sips/bites    Other  Recommendations Oral Care Recommendations: Oral  care BID   Follow up Recommendations None      Frequency and Duration     n/a       Prognosis   n/a     Swallow Study   General Date of Onset: 09/20/17 HPI: 62 yo female adm to [facility] with respiratory failure. PMH + for HEpC, heart failure, COPD, + smoker, CT head showed right occipital junction, left cerebellar CVA and cerebral hemipshere cvas.  Pt found to have bilateral infiltrates per CXR 09/16/17.  Pt denies dysphagia.   Type of Study: Bedside Swallow Evaluation Diet Prior to this Study: Regular;Thin liquids Temperature Spikes Noted: No Respiratory Status: Room air History of Recent Intubation: No Behavior/Cognition: Alert Oral Cavity Assessment: Within Functional Limits Oral Care Completed by SLP: No Oral Cavity - Dentition: Adequate natural dentition Vision: Functional for self-feeding Self-Feeding Abilities: Able to feed self;Needs set up Patient Positioning: Upright in bed Baseline Vocal Quality: Normal Volitional Cough: Strong Volitional Swallow: Able to elicit    Oral/Motor/Sensory Function Overall Oral Motor/Sensory Function: Within functional limits   Ice Chips Ice chips: Not tested   Thin Liquid Thin Liquid: Within functional limits Presentation: Cup;Straw Other Comments: 3 ounce water test conducted with pt demonstrated good tolerance without indication of airway compromise    Nectar Thick Nectar Thick Liquid: Not tested   Honey Thick Honey Thick Liquid: Not tested   Puree Puree: Within functional limits Presentation: Self Fed;Spoon   Solid   GO   Solid: Within functional limits Presentation: Self Fredirick Lathe 09/20/2017,10:44 AM  Luanna Salk, [place] [facility] SLP [phone]

## 2017-09-20 NOTE — Evaluation (Signed)
Occupational Therapy Evaluation Patient Details Name: Glenda Johnson MRN: [id number] DOB: [birth year] Today's Date: 09/20/2017    History of Present Illness Pt is a 62 y/o female with PMH of COPD, atrial fibrillation (non compliant with anticoagulation), HTN, Hep C, and diastolic heart failure admitted on 3/23 with acute respiratory failure due to pulmonary edema and hypertensive emergency. She was admitted to the ICU on BiPAP and improved with IV diuretics. Echocardiogram was performed on 3/24 and noted a 2.5 x 1.8 cm mobile mass in the left ventricle. 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 The following day, patient complained of worsening bilateral blurry vision. MRI brain today revealed multiple acute infarcts bilaterally consistent with cerebral emboli.    Clinical Impression   Pt lives alone and reports functioning independently. She was a former Therapist, sports. Pt demonstrates impaired cognition and decreased standing balance. She requires min assist for ADL. Difficult to accurately assess pt's vision due to cognition. Pt will need SNF for continued rehab upon discharge. Will follow acutely.    Follow Up Recommendations  SNF;Supervision/Assistance - 24 hour    Equipment Recommendations       Recommendations for Other Services       Precautions / Restrictions Precautions Precautions: Fall Restrictions Weight Bearing Restrictions: No      Mobility Bed Mobility Overal bed mobility: Needs Assistance Bed Mobility: Supine to Sit     Supine to sit: Supervision     General bed mobility comments: no difficulty  Transfers Overall transfer level: Needs assistance Equipment used: Rolling walker (2 wheeled) Transfers: Sit to/from Stand Sit to Stand: Min guard         General transfer comment: cues for hand placement    Balance Overall balance assessment: Needs  assistance Sitting-balance support: Feet supported Sitting balance-Leahy Scale: Good     Standing balance support: No upper extremity supported;During functional activity Standing balance-Leahy Scale: Fair Standing balance comment: can release walker in static standing                           ADL either performed or assessed with clinical judgement   ADL Overall ADL's : Needs assistance/impaired Eating/Feeding: Set up;Sitting Eating/Feeding Details (indicate cue type and reason): drinking Grooming: Oral care;Sitting;Minimal assistance Grooming Details (indicate cue type and reason): pt with some difficulty applying toothpaste to toothbrush Upper Body Bathing: Minimal assistance;Sitting   Lower Body Bathing: Minimal assistance;Sit to/from stand   Upper Body Dressing : Minimal assistance;Sitting   Lower Body Dressing: Minimal assistance;Sit to/from stand   Toilet Transfer: Minimal assistance;Ambulation;RW;BSC   Toileting- Clothing Manipulation and Hygiene: Minimal assistance;Sit to/from stand       Functional mobility during ADLs: Minimal assistance;Rolling walker       Vision Baseline Vision/History: Wears glasses Wears Glasses: At all times Patient Visual Report: Blurring of vision;Peripheral vision impairment Additional Comments: difficult to assess formally due to impaired cognition     Perception Perception Spatial deficits: impaired organization with clock drawing   Praxis      Pertinent Vitals/Pain       Hand Dominance Right   Extremity/Trunk Assessment Upper Extremity Assessment Upper Extremity Assessment: Overall WFL for tasks assessed   Lower Extremity Assessment Lower Extremity Assessment: Defer to PT evaluation   Cervical / Trunk Assessment Cervical / Trunk Assessment: Normal   Communication Communication Communication: No difficulties(tangential)   Cognition Arousal/Alertness: Awake/alert  Behavior During Therapy: WFL for tasks  assessed/performed Overall Cognitive Status: No family/caregiver present to determine baseline cognitive functioning Area of Impairment: Following commands;Safety/judgement;Problem solving;Attention;Memory                   Current Attention Level: Sustained Memory: Decreased short-term memory Following Commands: Follows one step commands with increased time;Follows multi-step commands inconsistently Safety/Judgement: Decreased awareness of safety;Decreased awareness of deficits   Problem Solving: Slow processing;Decreased initiation;Difficulty sequencing;Requires verbal cues     General Comments       Exercises     Shoulder Instructions      Home Living Family/patient expects to be discharged to:: Private residence Living Arrangements: Alone Available Help at Discharge: Family;Available PRN/intermittently Type of Home: House Home Access: Stairs to enter [employer] of Steps: "a few" Entrance Stairs-Rails: Right;Left Home Layout: One level         Bathroom Toilet: Standard     Home Equipment: None   Additional Comments: information provided by pt, no family/caregivers present to confirm information      Prior Functioning/Environment Level of Independence: Independent        Comments: information provided by pt, no family/caregivers present to confirm information        OT Problem List: Decreased strength;Decreased activity tolerance;Impaired balance (sitting and/or standing);Decreased coordination;Impaired vision/perception;Decreased cognition;Decreased safety awareness;Decreased knowledge of use of DME or AE      OT Treatment/Interventions: Self-care/ADL training;DME and/or AE instruction;Patient/family education;Balance training;Cognitive remediation/compensation;Therapeutic activities    OT Goals(Current goals can be found in the care plan section) Acute Rehab OT Goals Patient Stated Goal: return home OT Goal Formulation: With  patient Time For Goal Achievement: 09/27/17 Potential to Achieve Goals: Fair ADL Goals Pt Will Perform Grooming: (P) with supervision;standing Pt Will Perform Lower Body Bathing: (P) with supervision;sit to/from stand Pt Will Perform Lower Body Dressing: (P) with supervision;sit to/from stand Pt Will Transfer to Toilet: (P) with supervision;ambulating;regular height toilet Pt Will Perform Toileting - Clothing Manipulation and hygiene: (P) with supervision;sit to/from stand Additional ADL Goal #1: (P) Pt will gather items necessary for ADL around her room with supervision.  OT Frequency: Min 2X/week   Barriers to D/C: Decreased caregiver support          Co-evaluation              AM-PAC PT "6 Clicks" Daily Activity     Outcome Measure Help from another person eating meals?: A Little Help from another person taking care of personal grooming?: A Little Help from another person toileting, which includes using toliet, bedpan, or urinal?: A Little Help from another person bathing (including washing, rinsing, drying)?: A Little Help from another person to put on and taking off regular upper body clothing?: A Little Help from another person to put on and taking off regular lower body clothing?: A Little 6 Click Score: 18   End of Session Equipment Utilized During Treatment: Gait belt;Rolling walker Nurse Communication: Mobility status  Activity Tolerance: Patient tolerated treatment well Patient left: in chair;with call bell/phone within reach;with chair alarm set  OT Visit Diagnosis: Unsteadiness on feet (R26.81);Other abnormalities of gait and mobility (R26.89);Other symptoms and signs involving cognitive function;Low vision, both eyes (H54.2)                Time: 1440-1515 OT Time Calculation (min): 35 min Charges:  OT General Charges $OT Visit: 1 Visit OT Evaluation $OT Eval Moderate Complexity: 1 Mod OT Treatments $Self Care/Home Management : 8-22 mins G-Codes:  09/20/2017 Nestor Lewandowsky, OTR/L Pager: [phone]  Werner Lean, Haze Boyden 09/20/2017, 3:46 PM

## 2017-09-20 NOTE — Progress Notes (Signed)
  Echocardiogram 2D Echocardiogram limited with definity has been performed.  Darlina Sicilian M 09/20/2017, 8:43 AM

## 2017-09-20 NOTE — Progress Notes (Addendum)
STROKE TEAM PROGRESS NOTE   SUBJECTIVE (INTERVAL HISTORY) Patient is anxious. Reports blurry vision resolved. Upset she did not notice her breakfast tray this am. No new complaints otherwise. On IV heparin. Her sister Glenda Johnson arrived during rounds. Discussed dx, prognosis, workup and rehab plan with both of then. Questions answered. Emotional support given. RN also present for part of rounds.    OBJECTIVE Vitals:   09/19/17 2351 09/20/17 0100 09/20/17 0300 09/20/17 0700  BP: (!) 127/52 114/65  135/65  Pulse: 63   (!) 56  Resp: 20   18  Temp:    97.7 F (36.5 C)  TempSrc:    Axillary  SpO2: 93%   96%  Weight:   61.2 kg (134 lb 14.7 oz)   Height:        CBC:  Recent Labs  Lab 09/15/17 2005 09/16/17 0445  09/18/17 0238 09/19/17 0314  WBC 12.3* 9.1   < > 12.1* 9.3  NEUTROABS 9.1* 6.0  --   --   --   HGB 11.2* 9.2*   < > 10.5* 9.5*  HCT 38.4 31.0*   < > 35.4* 32.2*  MCV 81.9 79.7   < > 78.7 78.3  PLT 166 126*   < > 222 247   < > = values in this interval not displayed.    Basic Metabolic Panel:  Recent Labs  Lab 09/16/17 0445 09/17/17 0237  09/19/17 0913 09/20/17 0319  NA 133* 129*   < > 129* 129*  K 3.0* 4.6   < > 4.4 4.0  CL 96* 94*   < > 94* 97*  CO2 22 22   < > 23 23  GLUCOSE 145* 123*   < > 94 114*  BUN 29* 35*   < > 49* 43*  CREATININE 1.56* 1.85*   < > 1.93* 1.83*  CALCIUM 7.8* 8.4*   < > 8.5* 7.9*  MG 1.8 2.1  --   --   --   PHOS 2.9 2.8  --   --   --    < > = values in this interval not displayed.    Lipid Panel:     Component Value Date/Time   CHOL 63 09/19/2017 1310   TRIG 57 09/19/2017 1310   HDL 11 (L) 09/19/2017 1310   CHOLHDL 5.7 09/19/2017 1310   VLDL 11 09/19/2017 1310   LDLCALC 41 09/19/2017 1310   HgbA1c:  Lab Results  Component Value Date   HGBA1C 5.3 09/19/2017   Urine Drug Screen:     Component Value Date/Time   LABOPIA NONE DETECTED 09/16/2017 0040   COCAINSCRNUR NONE DETECTED 09/16/2017 0040   LABBENZ NONE DETECTED  09/16/2017 0040   AMPHETMU NONE DETECTED 09/16/2017 0040   THCU NONE DETECTED 09/16/2017 0040   LABBARB NONE DETECTED 09/16/2017 0040    Alcohol Level No results found for: ETH  IMAGING  Ct Head Wo Contrast 09/20/2017 1. Right occipitotemporal junction infarction as well as multiple additional small infarctions throughout the cerebral hemispheres and left cerebellum are stable in comparison with the prior MRI given differences in technique. No associated hemorrhage or mass effect. 2. No new acute intracranial abnormality identified.   Mr Brain Wo Contrast 09/19/2017 Multiple acute infarcts bilaterally consistent with cerebral emboli. No midline shift or hemorrhage.    PHYSICAL EXAM GENERAL: thin pale female lying in the bed. Slightly anxious. A&O x 3 HEENT: - Normocephalic and atraumatic LUNGS - clear to ausculation Ext: warm, well perfused, intact peripheral pulses,  no edema   NEURO:  Mental Status: alert & oriented x 3 Language: speech is clear. Naming, repetition, fluency, and comprehension intact. Cranial Nerves: PERRL 3 mm/brisk. L HH. no facial asymmetry, facial sensation intact, hearing intact, tongue/uvula/soft palate midline, No evidence of tongue atrophy or fibrillations.  Motor: normal strenthTone: is normal and bulk is normal Sensation- L sided neglect to touch and double simultaneous stimulation Coordination: intact   ASSESSMENT/PLAN [name] is a 62 y.o. female with history of COPD, atrial fibrillation (non compliant with anticoagulation), HTN, Hep C, and diastolic heart failure admitted on 3/23 with acute respiratory failure due to pulmonary edema and hypertensive emergency. Admitted to ICU on bipap and treated w/ IV diuretics. LV mass found - myxoma vs thrombus. In hospital she developed B blurry vision. MRI showed multiple acute B infarcts.   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 Yes; Fluid consistency: Thin  aspirin 81 mg daily prior to admission, now on aspirin 81 mg daily with heparin IV now on hld d/t elevated levels. Received warfarin last night. Agree with anticoagulation.  Patient counseled to be compliant with her antithrombotic medications  Ongoing aggressive stroke risk factor management  Therapy recommendations:  SNF. Discussed with sister. Timing dependent on cardiac plans  Disposition:  pending [place]  LV mass  thrombus vs myxoma  Repeat echo w/ contrast - attached on a stalk at apical septum  TCTS to see  On IV hep  Atrial Fibrillation  Home anticoagulation:  none   CHA2DS2-VASc Score = 5, ?2 oral anticoagulation recommended  Age in Years:  <65   0  Sex:  Female   Female   +1    Hypertension History:  yes   +1     Diabetes Mellitus:  0  Congestive Heart Failure History:  yes   +1  Vascular Disease History:  0     Stroke/TIA/Thromboembolism History:     +2  Recommend long-term AC for secondary stroke prevention   Hypertension  Stable  Permissive hypertension (OK if < 220/120) but gradually normalize in 5-7 days  Long-term BP goal normotensive  Other Stroke Risk Factors  Cigarette smoker, advised to stop smoking  Coronary artery disease, non-obstructive  Chronic systolic CHF  Other Active Problems  RAS  COPD  Kidney - Cr 1.85  Hospital day # [place], MSN, APRN, ANVP-BC, AGPCNP-BC [facility] Stroke  Nurse [place] for Schedule & Pager information 09/20/2017 1:04 PM   ATTENDING NOTE: I reviewed above note and agree with the assessment and plan. I have made any additions or clarifications directly to the above note. Pt was seen and examined.   62 year old female with history of hypertension due to renal artery stenosis, CHF, COPD, A. fib not on anticoagulation, smoker admitted for respiratory failure due to pulmonary edema and hypertensive emergency.  Initially admitted to ICU  for IV diuresis and now back to floor.  TTE showed 25 x 1.8 cm mobile mass, cardiac MRI concerning for myxoma versus thrombus.  Neurology consulted for bilateral decreased visual acuity on 09/18/17. On exam, she AAOx3, decreased visual acuity bilaterally, difficulty with finger counting during confrontation but able to see finger movement bilaterally. Able to attend both sides during test, not consistent with hemianopia but more likely due to significantly decreased visual acuity. No facial droop or UE/LE weakness. No aphasia. MRI showed cardioembolic patter stroke including left cerebellum, bilateral PCA/MCA, right MCA, bilateral MCA/ACA and right ACA punctate infarcts as well as moderate right inferior MCA territory infarct.  Carotid Doppler unremarkable.  LDL 41 and A1c 5.3.  Agree with anticoagulation for stroke prevention.  She is on Coumadin with heparin IV bridge.  INR 3.07 today.  Cardiac surgery consultation requested to consider surgical resection.   Glenda Hawking, MD PhD Stroke Neurology 09/20/2017 3:04 PM    To contact Stroke Continuity provider, please refer to [email or web address] After hours, contact General Neurology

## 2017-09-20 NOTE — Progress Notes (Signed)
Preliminary notes by tech--Bilateral lower extremities venous duplex study finished.  Veins which evaluated demonstrate compressible with flow.  Right popliteal fossa, there is a 2.6x0.9x2.8cm complex fluid collection noted.  Study could not be able to continue due to patient refused complete. Result notified to RN Bill in the unit.   Hongying Masiah Woody(RDMS RVT) 09/20/17 2:28 PM

## 2017-09-20 NOTE — Consult Note (Addendum)
[address]       [place] [address]             [phone]        Glenda Johnson [place] Medical Record #[id number] Date of Birth: August 07, 1955  Referring: Dr Liam Rogers Primary Care: Paris Lore, MD Primary Cardiologist:No primary care provider on file.  Chief Complaint:    Chief Complaint  Patient presents with  . Shortness of Breath  Patient examined, most recent echocardiogram, cardiac MRI, coronary angiogram images all personally reviewed and counseled with patient and sister  History of Present Illness:     62 year old female active smoker with history of poorly controlled hypertension, left ventricular hypertrophy and moderate LV diastolic dysfunction, atrial fibrillation noncompliant with anticoagulation, COPD, hepatitis C recently treated with oral agent-- admitted to the ER 4 days ago with respiratory distress and flash pulmonary edema with hypertensive crisis.  She was severely acidotic and required BiPAP and urgent anti-hypertensive therapy.  The patient had been hospitalized 2 weeks earlier at [facility] in [place] for nausea vomiting and positive cardiac enzymes consistent with non-STEMI.  Cardiac catheterization showed nonobstructive coronary artery disease.  LV gram was performed reported as mild dysfunction but no other abnormalities. She was in atrial fibrillation and refused OAC anticoagulation.  On March 23 she presented to the ED at [facility] with severe shortness of breath respiratory distress.  She had pulmonary edema on x-ray, systolic blood pressure of 230, base deficit of -10, positive troponins increasing to 9.5 and was admitted to critical care.  She avoided intubation but required BiPAP.  Echocardiogram was performed showing severe LV H with mild LV dysfunction and a 2 cm filling defect in the LV apex.  Patient had a echocardiogram at [facility] 8 months ago showing no LV apical density.  Last echocardiogram at [facility]  was 15 months ago without LV apical density.  After the  the patient's pulmonary status was stabilized a cardiology consultation recommended TEE with Infinity contrast showing the apical density connected to the LV septum.  This was followed with a cardiac MRI which showed the same findings.  There is question whether this was a thrombus or a cardiac tumor.  While the patient was in ICU she developed loss of left-sided visual field and CT of brain showed a right occipital stroke.  No other neurologic deficit.  Duplex scan of the leg showed no DVT the patient has been atrial fibrillation chronically without anticoagulation.  CT surgery evaluation was requested for potential surgical management of the LV chamber density-thrombus versus myxoma. Patient currently has significant renal dysfunction with creatinine of 1.9.  She has elevated hepatic enzymes from recent hepatitis C and oral therapy.  She has poor mechanics of breathing on bedside assessment and CT scan of chest and PFTs are pending.  Patient is a very poor candidate for sternotomy and cardiac surgery currently.  I suspect the LV mass is a thrombus because this was not present 8 months ago and would be highly unusual for a myxoma to increase in size so rapidly.  The patient is hypercoagulable with history of A. fib, heavy smoking, left ventricular hypertrophy, hepatitis, and severe hypertension.  I would anticipate at this time the patient would develop multiorgan failure after open heart surgery and cardio pony bypass.  I would recommend continuing with her anticoagulation which has been initiated.  Of note her INR rapidly increased from 1.3-3.0 with only 5 mg of Coumadin  probably related to her underlying hepatic disease.  I would recommend repeat echocardiogram in 8 weeks to assess the LV apical thrombus.  She will need close monitoring with INR.   Risk for therapist 0 home case immediate takes 5 to help holding the to no known O sedation yes  because birth reports to tell anybody not Patient should stop smoking to have the chance to improve her pulmonary function which would reduce pulmonary risk for surgery.  The patient currently has requested DO NOT RESUSCITATE status and that would need to be addressed as well.  Patient has been noncompliant with smoking, not taking her antihypertensive medication, not taking anticoagulation, and she has been uncooperative getting ultrasound scans of her neck and legs.   The patient would probably not be able to meet the necessary cooperation with nursing staff to recover from open heart surgery.  Current Activity/ Functional Status: Patient will be discharged to a skilled nursing facility and probably will not be able to live independently in the future   Zubrod Score: At the time of surgery this patient's most appropriate activity status/level should be described as: []     0    Normal activity, no symptoms []     1    Restricted in physical strenuous activity but ambulatory, able to do out light work []     2    Ambulatory and capable of self care, unable to do work activities, up and about                 more than 50%  Of the time                            [x]     3    Only limited self care, in bed greater than 50% of waking hours []     4    Completely disabled, no self care, confined to bed or chair []     5    Moribund  Past Medical History:  Diagnosis Date  . Renal artery stenosis ([place])   . Secondary hypertension   . Tobacco abuse     Past Surgical History:  Procedure Laterality Date  . BACK SURGERY    . CARDIAC CATHETERIZATION N/A 05/25/2016   Procedure: Left Heart Cath and Coronary Angiography;  Surgeon: Lorretta Harp, MD;  Location: [facility];  Service: Cardiovascular;  Laterality: N/A;  . stent renal artery      Social History   Tobacco Use  Smoking Status Current Every Day Smoker  Smokeless Tobacco Never Used    Social History   Substance and Sexual Activity    Alcohol Use No    Social History   Socioeconomic History  . Marital status: Legally Separated    Spouse name: Not on file  . Number of children: Not on file  . Years of education: Not on file  . Highest education level: Not on file  Occupational History  . Not on file  Social Needs  . Financial resource strain: Not on file  . Food insecurity:    Worry: Not on file    Inability: Not on file  . Transportation needs:    Medical: Not on file    Non-medical: Not on file  Tobacco Use  . Smoking status: Current Every Day Smoker  . Smokeless tobacco: Never Used  Substance and Sexual Activity  . Alcohol use: No  . Drug use: No  .  Sexual activity: Not Currently    Birth control/protection: None  Lifestyle  . Physical activity:    Days per week: Not on file    Minutes per session: Not on file  . Stress: Not on file  Relationships  . Social connections:    Talks on phone: Not on file    Gets together: Not on file    Attends religious service: Not on file    Active member of club or organization: Not on file    Attends meetings of clubs or organizations: Not on file    Relationship status: Not on file  . Intimate partner violence:    Fear of current or ex partner: Not on file    Emotionally abused: Not on file    Physically abused: Not on file    Forced sexual activity: Not on file  Other Topics Concern  . Not on file  Social History Narrative  . Not on file    Allergies  Allergen Reactions  . Codeine Anaphylaxis  . Tramadol Anaphylaxis    No hydrocodone or anything connected No hydrocodone or anything connected   . Clarithromycin Nausea And Vomiting  . Citalopram Other (See Comments)    Caused more depression   . Fish Allergy Itching    ears  . Other     Patient says that all narcotics give her anaphylaxis.   . Wellbutrin [Bupropion] Other (See Comments)    "could not speak a full sentence for a whole year"    Current Facility-Administered Medications   Medication Dose Route Frequency Provider Last Rate Last Dose  . 0.9 %  sodium chloride infusion  250 mL Intravenous PRN Chesley Mires, MD      . 0.9 %  sodium chloride infusion   Intravenous Continuous Kerney Elbe, MD 75 mL/hr at 09/20/17 0408    . acetaminophen (TYLENOL) tablet 650 mg  650 mg Oral Q4H PRN Chesley Mires, MD   650 mg at 09/16/17 1533  . aspirin EC tablet 81 mg  81 mg Oral Daily Chesley Mires, MD   81 mg at 09/20/17 4008  . carvedilol (COREG) tablet 12.5 mg  12.5 mg Oral BID WC Chesley Mires, MD   12.5 mg at 09/20/17 0927  . feeding supplement (BOOST / RESOURCE BREEZE) liquid 1 Container  1 Container Oral TID BM Domenic Polite, MD   1 Container at 09/19/17 2059  . gi cocktail (Maalox,Lidocaine,Donnatal)  30 mL Oral Once Chesley Mires, MD      . ipratropium-albuterol (DUONEB) 0.5-2.5 (3) MG/3ML nebulizer solution 3 mL  3 mL Nebulization Q4H PRN Chesley Mires, MD      . LORazepam (ATIVAN) tablet 0.5 mg  0.5 mg Oral Q6H PRN Chesley Mires, MD   0.5 mg at 09/19/17 0039  . MEDLINE mouth rinse  15 mL Mouth Rinse BID Hammonds, Sharyn Blitz, MD   15 mL at 09/19/17 2100  . ondansetron (ZOFRAN) injection 4 mg  4 mg Intravenous Q6H PRN Chesley Mires, MD   4 mg at 09/20/17 6761  . pantoprazole (PROTONIX) EC tablet 40 mg  40 mg Oral Q1200 Domenic Polite, MD      . prochlorperazine (COMPAZINE) injection 10 mg  10 mg Intravenous Q4H PRN Chesley Mires, MD   10 mg at 09/16/17 2107  . sodium chloride 0.9 % bolus 500 mL  500 mL Intravenous Once Kerney Elbe, MD      . Warfarin - Pharmacist Dosing Inpatient   Does not apply [id number] Domenic Polite, MD  Stopped at 09/20/17 1800    Medications Prior to Admission  Medication Sig Dispense Refill Last Dose  . albuterol (PROVENTIL HFA;VENTOLIN HFA) 108 (90 Base) MCG/ACT inhaler Inhale 2 puffs into the lungs every 4 (four) hours as needed.   09/15/2017 at Unknown time  . aspirin EC 81 MG tablet Take 1 tablet (81 mg total) by mouth daily. 30 tablet 0 09/15/2017 at  Unknown time  . diphenhydrAMINE (BENADRYL) 25 MG tablet Take 25 mg by mouth as needed for itching.   Past Week at Unknown time  . furosemide (LASIX) 40 MG tablet Take 40 mg by mouth daily as needed.   09/12/2017 at prn  . hydrALAZINE (APRESOLINE) 50 MG tablet Take 50 mg by mouth 2 (two) times daily.  3 09/13/2017  . ibuprofen (ADVIL,MOTRIN) 200 MG tablet Take 600-800 mg by mouth every 6 (six) hours as needed for moderate pain.   09/14/2017 at Unknown time  . LORazepam (ATIVAN) 0.5 MG tablet Take 0.5 mg by mouth every 6 (six) hours as needed for anxiety.   2 09/14/2017 at Unknown time  . ondansetron (ZOFRAN) 4 MG tablet Take 4 mg by mouth every 8 (eight) hours as needed for nausea.  0 09/15/2017 at Unknown time  . Potassium Chloride ER 20 MEQ TBCR Take 20 mEq by mouth daily.   09/13/2017    Family History  Problem Relation Age of Onset  . Diabetes Maternal Grandmother      Review of Systems:   ROS      Cardiac Review of Systems: Y or  [    ]= no  Chest Pain [    ]  Resting SOB [ y  ] Exertional SOB  [  y]  Orthopnea Blue.Reese  ]   Pedal Edema [   ]    Palpitations Blue.Reese  ] Syncope  [  ]   Presyncope [   ]  General Review of Systems: [Y] = yes [  ]=no Constitional: recent weight change [  ]; anorexia [ y ]; fatigue Blue.Reese  ]; nausea y]; night sweats [  ]; fever [  ]; or chills [  ]                                                               Dental: poor dentition[  ]; Last Dentist visit: One year  Eye : blurred vision [  ]; diplopia [   ]; vision changes [ y ];  Amaurosis fugax[  ]; Resp: cough Blue.Reese  ];  wheezing[  ];  hemoptysis[  ]; shortness of breath[y  ]; paroxysmal nocturnal dyspnea[  ]; dyspnea on exertion[y  ]; or orthopnea[  ];  GI:  gallstones[  ], vomiting[ y ];  dysphagia[  ]; melena[  ];  hematochezia [  ]; heartburn[ y ];   Hx of  Colonoscopy[  ]; GU: kidney stones [  ]; hematuria[  ];   dysuria [  ];  nocturia[  ];  history of     obstruction [  ]; urinary frequency [  ]             Skin:  rash, swelling[  ];, hair loss[  ];  peripheral edema[  ];  or itching[  ]; acute renal failure Musculosketetal:  myalgias[  ];  joint swelling[  ];  joint erythema[  ];  joint pain[y  ];  back pain[  ];  Heme/Lymph: bruising[  ];  bleeding[  ];  anemia[  ];  Neuro: TIA[  ];  headaches[  ];  stroke[  ];  vertigo[  ];  seizures[  ];   paresthesias[  ];  difficulty walking[  ];  Psych:depression[  ]; anxiety[  ];  Endocrine: diabetes[  ];  thyroid dysfunction[  ];  Immunizations: Flu [  ]; Pneumococcal[  ];    Physical Exam: BP 135/70   Pulse 60   Temp 97.7 F (36.5 C) (Axillary)   Resp 18   Ht 5\' 7"  (1.702 m)   Wt 134 lb 14.7 oz (61.2 kg)   SpO2 96%   BMI 21.13 kg/m       Physical Exam  General: Overweight middle-aged white female, anxious, impulsive, not fully oriented following recent stroke HEENT: Normocephalic pupils equal , dentition adequate Neck: Supple without JVD, adenopathy, or bruit Chest: Scattered rhonchi with coarse breath sounds but without tenderness or deformity             Cardiovascular: Regular rate and rhythm, no murmur, no gallop, peripheral pulses             palpable in all extremities Abdomen:  Soft, nontender, no palpable mass or organomegaly Extremities: Warm, well-perfused, no clubbing cyanosis edema or tenderness,              no venous stasis changes of the legs Rectal/GU: Deferred Neuro: Grossly non--focal and symmetrical throughout Skin: Clean and dry without rash or ulceration     Diagnostic Studies & Laboratory data:     Recent Radiology Findings:   Ct Head Wo Contrast  Result Date: 09/20/2017 CLINICAL DATA:  62 y/o  F; numbness, tingling, and paresthesias. EXAM: CT HEAD WITHOUT CONTRAST TECHNIQUE: Contiguous axial images were obtained from the base of the skull through the vertex without intravenous contrast. COMPARISON:  09/19/2017 MRI of the head. FINDINGS: Brain: Small focus of infarction within the right occipitotemporal junction and  multiple small foci of infarction throughout the cerebral hemispheres and left cerebellum are stable in comparison with the prior MRI given differences in technique. No significant associated mass effect. No hemorrhage identified. No new acute intracranial abnormality. No effacement of basilar cisterns, hydrocephalus, or extra-axial collection. Vascular: No hyperdense vessel or unexpected calcification. Skull: Normal. Negative for fracture or focal lesion. Sinuses/Orbits: No acute finding. Other: None. IMPRESSION: 1. Right occipitotemporal junction infarction as well as multiple additional small infarctions throughout the cerebral hemispheres and left cerebellum are stable in comparison with the prior MRI given differences in technique. No associated hemorrhage or mass effect. 2. No new acute intracranial abnormality identified. Electronically Signed   By: Kristine Garbe M.D.   On: 09/20/2017 01:04   Mr Brain Wo Contrast  Result Date: 09/19/2017 CLINICAL DATA:  Stroke EXAM: MRI HEAD WITHOUT CONTRAST TECHNIQUE: Multiplanar, multiecho pulse sequences of the brain and surrounding structures were obtained without intravenous contrast. COMPARISON:  None. FINDINGS: Brain: Multiple acute infarcts are present bilaterally. Largest infarct in the right occipital lobe. Numerous small cortical and subcortical infarcts in both cerebral hemispheres. Small acute infarcts left cerebellum. Negative for hemorrhage or mass. Ventricle size normal. No midline shift. Minimal if any chronic ischemia. Vascular: Normal arterial flow voids Skull and upper cervical spine: Negative Sinuses/Orbits: Negative Other: None IMPRESSION: Multiple acute infarcts bilaterally consistent with cerebral emboli. No midline shift or  hemorrhage. Electronically Signed   By: Franchot Gallo M.D.   On: 09/19/2017 11:40     I have independently reviewed the above radiologic studies.  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, with embolic stroke and possible embolic fragments to renal circulation resulting in elevated creatinine and coronary circulation resulting in elevated troponin  Chronic atrial fibrillation Severe COPD with active smoking, PFTs and chest CT pending Poorly controlled hypertension with noncompliance with medications Noncompliance with anticoagulation for chronic atrial fibrillation   recent therapy for hepatitis C with questionable full compliance of medication  Patient is very poor candidate for open cardiac surgery because of the above reasons as well as her nonambulatory status and would proceed with current plan for anticoagulation and re-echo in 8 weeks to assess LV thrombus.  Situation discussed with patient's sister who is more aware and understanding of the patient's situation    @ME1 @ 09/20/2017 2:53 PM

## 2017-09-20 NOTE — Progress Notes (Addendum)
Upon reassessment, patient voiced concerns of loss of color discrimination. This RN pointed out colors in the room and the only color patient was able to identify was red, which she said she saw as orange. Patient said that this was a new symptom that she hadn't noticed before. Additionally patient voiced that she had decreased sensation on her bilateral extremities below the knee more on the right leg. Per patient this was not her baseline.  Neurology was paged. Patient was taken to CT

## 2017-09-20 NOTE — Progress Notes (Signed)
Progress Note  Patient Name: Glenda Johnson Date of Encounter: 09/20/2017  Primary Cardiologist:  Gaynell Face , Angelena Form at [facility].    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 MRI suggests the mass is c/w thrombus.    Ive discussed the case with Dr. Meda Coffee.   We will repeat a limited echo with definity contrast to see if it further defines the mass  She has had neurologic changes and had been found to have had cerebral and occipitotemporal embolic strokes.   Inpatient Medications    Scheduled Meds: . aspirin EC  81 mg Oral Daily  . carvedilol  12.5 mg Oral BID WC  . feeding supplement  1 Container Oral TID BM  . gi cocktail  30 mL Oral Once  . mouth rinse  15 mL Mouth Rinse BID  . pantoprazole  40 mg Oral Q1200  . Warfarin - Pharmacist Dosing Inpatient   Does not apply q1800   Continuous Infusions: . sodium chloride    . sodium chloride 75 mL/hr at 09/20/17 0408  . heparin 1,050 Units/hr (09/20/17 0408)  . sodium chloride     PRN Meds: sodium chloride, acetaminophen, ipratropium-albuterol, LORazepam, ondansetron (ZOFRAN) IV, perflutren lipid microspheres (DEFINITY) IV suspension, prochlorperazine   Vital Signs    Vitals:   09/19/17 2351 09/20/17 0100 09/20/17 0300 09/20/17 0700  BP: (!) 127/52 114/65  135/65  Pulse: 63   (!) 56  Resp: 20   18  Temp:    97.7 F (36.5 C)  TempSrc:    Axillary  SpO2: 93%   96%  Weight:   134 lb 14.7 oz (61.2 kg)   Height:        Intake/Output Summary (Last 24 hours) at 09/20/2017 0847 Last data filed at 09/20/2017 0500 Gross per 24 hour  Intake 2399.77 ml  Output 500 ml  Net 1899.77 ml   Filed Weights   09/18/17 0500 09/19/17 0450 09/20/17  0300  Weight: 133 lb 9.6 oz (60.6 kg) 134 lb 7.7 oz (61 kg) 134 lb 14.7 oz (61.2 kg)    Telemetry    NSR , occasional PACs  - Personally Reviewed  ECG     - Personally Reviewed  Physical Exam   GEN: lethargic female   Neck: No JVD Cardiac: RRR, occasional premature beats  Respiratory: Clear to auscultation bilaterally. GI: Soft, nontender, non-distended  MS: No edema; No deformity. Neuro:  Nonfocal  Psych: Normal affect   Labs    Chemistry Recent Labs  Lab 09/15/17 2005 09/16/17 0445 09/17/17 0237 09/18/17 0238 09/19/17 0913 09/20/17 0319  NA 130* 133* 129* 127* 129* 129*  K 4.2 3.0* 4.6 5.1 4.4 4.0  CL 97* 96* 94* 90* 94* 97*  CO2 14* 22 22 20* 23 23  GLUCOSE 116* 145* 123* 114* 94 114*  BUN 30* 29* 35* 48* 49* 43*  CREATININE 1.64* 1.56* 1.85* 2.22* 1.93* 1.83*  CALCIUM 8.6* 7.8* 8.4* 8.7* 8.5* 7.9*  PROT 6.9 5.5* 5.9*  --   --   --   ALBUMIN 3.4* 2.7* 3.0*  --   --   --   AST 116* 92* 65*  --   --   --   ALT 166* 123* 96*  --   --   --  ALKPHOS 106 77 84  --   --   --   BILITOT 1.9* 2.1* 1.9*  --   --   --   GFRNONAA 32* 34* 28* 22* 26* 28*  GFRAA 37* 40* 32* 26* 31* 33*  ANIONGAP 19* 15 13 17* 12 9     Hematology Recent Labs  Lab 09/17/17 0237 09/18/17 0238 09/19/17 0314  WBC 12.3* 12.1* 9.3  RBC 4.25 4.50 4.11  HGB 10.1* 10.5* 9.5*  HCT 33.7* 35.4* 32.2*  MCV 79.3 78.7 78.3  MCH 23.8* 23.3* 23.1*  MCHC 30.0 29.7* 29.5*  RDW 15.9* 16.0* 16.2*  PLT 171 222 247    Cardiac Enzymes Recent Labs  Lab 09/16/17 0128 09/16/17 0445 09/16/17 1036 09/19/17 0913  TROPONINI 8.63* 9.29* 8.99* 3.29*    Recent Labs  Lab 09/15/17 1906  TROPIPOC 5.50*     BNP Recent Labs  Lab 09/15/17 2006 09/16/17 0445 09/17/17 0237  BNP >4,500.0* >4,500.0* 3,035.3*     DDimer No results for input(s): DDIMER in the last 168 hours.   Radiology    Ct Head Wo Contrast  Result Date: 09/20/2017 CLINICAL DATA:  62 y/o  F; numbness, tingling, and  paresthesias. EXAM: CT HEAD WITHOUT CONTRAST TECHNIQUE: Contiguous axial images were obtained from the base of the skull through the vertex without intravenous contrast. COMPARISON:  09/19/2017 MRI of the head. FINDINGS: Brain: Small focus of infarction within the right occipitotemporal junction and multiple small foci of infarction throughout the cerebral hemispheres and left cerebellum are stable in comparison with the prior MRI given differences in technique. No significant associated mass effect. No hemorrhage identified. No new acute intracranial abnormality. No effacement of basilar cisterns, hydrocephalus, or extra-axial collection. Vascular: No hyperdense vessel or unexpected calcification. Skull: Normal. Negative for fracture or focal lesion. Sinuses/Orbits: No acute finding. Other: None. IMPRESSION: 1. Right occipitotemporal junction infarction as well as multiple additional small infarctions throughout the cerebral hemispheres and left cerebellum are stable in comparison with the prior MRI given differences in technique. No associated hemorrhage or mass effect. 2. No new acute intracranial abnormality identified. Electronically Signed   By: Kristine Garbe M.D.   On: 09/20/2017 01:04   Mr Brain Wo Contrast  Result Date: 09/19/2017 CLINICAL DATA:  Stroke EXAM: MRI HEAD WITHOUT CONTRAST TECHNIQUE: Multiplanar, multiecho pulse sequences of the brain and surrounding structures were obtained without intravenous contrast. COMPARISON:  None. FINDINGS: Brain: Multiple acute infarcts are present bilaterally. Largest infarct in the right occipital lobe. Numerous small cortical and subcortical infarcts in both cerebral hemispheres. Small acute infarcts left cerebellum. Negative for hemorrhage or mass. Ventricle size normal. No midline shift. Minimal if any chronic ischemia. Vascular: Normal arterial flow voids Skull and upper cervical spine: Negative Sinuses/Orbits: Negative Other: None IMPRESSION:  Multiple acute infarcts bilaterally consistent with cerebral emboli. No midline shift or hemorrhage. Electronically Signed   By: Franchot Gallo M.D.   On: 09/19/2017 11:40    Cardiac Studies     Patient Profile     62 y.o. female with hx of chronic diastolic CHF Now found to have a mass in the apical septal region of her LV   Assessment & Plan    1.  LV mass:   Reviewed additional views of the mass this am with new echo / contrast. The mass appears to be attached on a stalk on the apical septum. I've called [facility] for a surgical opinion  If she is a candidate for surgical removal, we would likely  need to do a cath   Will continue heparin for now   2.  CKD:   Creatinine slightly improved with IV hydration.    For questions or updates, please contact [place] Please consult [email or web address] for contact info under Cardiology/STEMI.      Signed, Mertie Moores, MD  09/20/2017, 8:47 AM

## 2017-09-20 NOTE — Progress Notes (Signed)
Preliminary notes by tech--Bilateral carotid duplex exam completed. Bilateral vertebral arteries antegrade flow. Intimal thickening bilaterally. Diffused plaque seen in CCA and ICA bilaterally. 1-39% bilateral ICA stenosis. Limited study due to patient could not stay still during the exam.    Hongying Nataliah Hatlestad (RDMS RVT) 09/20/17 2:43 PM

## 2017-09-20 NOTE — Progress Notes (Signed)
ANTICOAGULATION CONSULT NOTE - [place] for Heparin / Warfarin Indication: LV thrombus  Allergies  Allergen Reactions  . Codeine Anaphylaxis  . Tramadol Anaphylaxis    No hydrocodone or anything connected No hydrocodone or anything connected   . Clarithromycin Nausea And Vomiting  . Citalopram Other (See Comments)    Caused more depression   . Fish Allergy Itching    ears  . Other     Patient says that all narcotics give her anaphylaxis.   . Wellbutrin [Bupropion] Other (See Comments)    "could not speak a full sentence for a whole year"    Patient Measurements: Height: 5\' 7"  (170.2 cm) Weight: 134 lb 14.7 oz (61.2 kg) IBW/kg (Calculated) : 61.6   Vital Signs: Temp: 97.7 F (36.5 C) (03/28 0700) Temp Source: Axillary (03/28 0700) BP: 135/70 (03/28 0927) Pulse Rate: 60 (03/28 0927)  Labs: Recent Labs    09/18/17 0238  09/19/17 0314 09/19/17 0913 09/19/17 1836 09/20/17 0319 09/20/17 0906  HGB 10.5*  --  9.5*  --   --   --   --   HCT 35.4*  --  32.2*  --   --   --   --   PLT 222  --  247  --   --   --   --   LABPROT  --   --  19.5*  --   --   --  31.4*  INR  --   --  1.66  --   --   --  3.07  HEPARINUNFRC  --    < > 0.30  --  0.43 0.33  --   CREATININE 2.22*  --   --  1.93*  --  1.83*  --   TROPONINI  --   --   --  3.29*  --   --   --    < > = values in this interval not displayed.    Estimated Creatinine Clearance: 30.4 mL/min (A) (by C-G formula based on SCr of 1.83 mg/dL (H)).   Medical History: Past Medical History:  Diagnosis Date  . Renal artery stenosis ([place])   . Secondary hypertension   . Tobacco abuse     Assessment: 26 YOF on Heparin/Warfarin for anticoagulation with LV thrombus identified on cardiac work-up.  Heparin level this morning remains therapeutic (HL 0.33 << 0.43, goal of 0.3-0.5 with new CVAs noted).   INR today is SUPRAtherapeutic after a higher dose of warfarin was given on 3/27 than intended (INR  3.07 << 1.66, goal of 2-3). A standing dose of warfarin 5 mg daily was ordered on 3/26 and the dose was intended to be lowered to 3 mg x 1 on 3/27 however both doses were given for a total of 8 mg (SZP reported). Large jump in INR due to higher dose given. The INR should remain therapeutic for a few days - will hold warfarin.  Goal of Therapy:  INR 2-3 Heparin level 0.3-0.7 units/ml Monitor platelets by anticoagulation protocol: Yes    Plan:  - Hold Heparin - Hold Warfarin - Will monitor trends in INR and follow-up with continued cards/CVTS eval - Will plan to resume warfarin dosing when appropriate.  Thank you for allowing pharmacy to be a part of this patient's care.  Alycia Rossetti, PharmD, BCPS Clinical Pharmacist Pager: [phone] Clinical phone for 09/20/2017 from 7a-3:30p: [phone] If after 3:30p, please call main pharmacy at: [phone] 09/20/2017 9:58 AM

## 2017-09-21 ENCOUNTER — Encounter (HOSPITAL_COMMUNITY): Payer: BLUE CROSS/BLUE SHIELD

## 2017-09-21 DIAGNOSIS — Z7901 Long term (current) use of anticoagulants: Secondary | ICD-10-CM

## 2017-09-21 DIAGNOSIS — I5189 Other ill-defined heart diseases: Secondary | ICD-10-CM

## 2017-09-21 DIAGNOSIS — I6330 Cerebral infarction due to thrombosis of unspecified cerebral artery: Secondary | ICD-10-CM

## 2017-09-21 LAB — CBC
HEMATOCRIT: 31.8 % — AB (ref 36.0–46.0)
HEMOGLOBIN: 9.4 g/dL — AB (ref 12.0–15.0)
MCH: 23.4 pg — ABNORMAL LOW (ref 26.0–34.0)
MCHC: 29.6 g/dL — ABNORMAL LOW (ref 30.0–36.0)
MCV: 79.1 fL (ref 78.0–100.0)
Platelets: 283 10*3/uL (ref 150–400)
RBC: 4.02 MIL/uL (ref 3.87–5.11)
RDW: 16 % — ABNORMAL HIGH (ref 11.5–15.5)
WBC: 8.7 10*3/uL (ref 4.0–10.5)

## 2017-09-21 LAB — HCV RNA QUANT: HCV Quantitative: NOT DETECTED IU/mL (ref >50)

## 2017-09-21 LAB — BASIC METABOLIC PANEL
ANION GAP: 10 (ref 5–15)
BUN: 29 mg/dL — ABNORMAL HIGH (ref 6–20)
CHLORIDE: 100 mmol/L — AB (ref 101–111)
CO2: 20 mmol/L — AB (ref 22–32)
Calcium: 8.2 mg/dL — ABNORMAL LOW (ref 8.9–10.3)
Creatinine, Ser: 1.64 mg/dL — ABNORMAL HIGH (ref 0.44–1.00)
GFR calc non Af Amer: 32 mL/min — ABNORMAL LOW (ref >60)
GFR, EST AFRICAN AMERICAN: 37 mL/min — AB (ref >60)
Glucose, Bld: 121 mg/dL — ABNORMAL HIGH (ref 65–99)
POTASSIUM: 3.7 mmol/L (ref 3.5–5.1)
Sodium: 130 mmol/L — ABNORMAL LOW (ref 135–145)

## 2017-09-21 LAB — HEPATIC FUNCTION PANEL
ALT: 44 U/L (ref 14–54)
AST: 23 U/L (ref 15–41)
Albumin: 2.7 g/dL — ABNORMAL LOW (ref 3.5–5.0)
Alkaline Phosphatase: 65 U/L (ref 38–126)
Bilirubin, Direct: 0.6 mg/dL — ABNORMAL HIGH (ref 0.1–0.5)
Indirect Bilirubin: 0.7 mg/dL (ref 0.3–0.9)
Total Bilirubin: 1.3 mg/dL — ABNORMAL HIGH (ref 0.3–1.2)
Total Protein: 5.6 g/dL — ABNORMAL LOW (ref 6.5–8.1)

## 2017-09-21 LAB — PROTIME-INR
INR: 2.39
PROTHROMBIN TIME: 25.9 s — AB (ref 11.4–15.2)

## 2017-09-21 MED ORDER — NICOTINE 21 MG/24HR TD PT24: 21.0000 mg | MEDICATED_PATCH | Freq: Every day | TRANSDERMAL | Status: DC

## 2017-09-21 MED ORDER — MELATONIN 3 MG PO TABS
3.0000 mg | ORAL_TABLET | Freq: Every day | ORAL | Status: DC
  Filled 2017-09-21: qty 1

## 2017-09-21 MED ORDER — WARFARIN SODIUM 5 MG PO TABS
5.0000 mg | ORAL_TABLET | Freq: Once | ORAL | Status: AC
  Administered 2017-09-21: 5 mg via ORAL
  Filled 2017-09-21: qty 1

## 2017-09-21 MED ORDER — NON FORMULARY
3.0000 mg | Freq: Every day | Status: DC
Start: 2017-09-21 — End: 2017-09-21

## 2017-09-21 NOTE — Progress Notes (Signed)
Physical Therapy Treatment Patient Details Name: Glenda Johnson MRN: [id number] DOB: 02-09-1956 Today's Date: 09/21/2017    History of Present Illness Pt is a 62 y/o female with PMH of COPD, atrial fibrillation (non compliant with anticoagulation), HTN, Hep C, and diastolic heart failure admitted on 3/23 with acute respiratory failure due to pulmonary edema and hypertensive emergency. She was admitted to the ICU on BiPAP and improved with IV diuretics. Echocardiogram was performed on 3/24 and noted a 2.5 x 1.8 cm mobile mass in the left ventricle. 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 The following day, patient complained of worsening bilateral blurry vision. MRI brain today revealed multiple acute infarcts bilaterally consistent with cerebral emboli.     PT Comments    Pt admitted with above diagnosis. Pt currently with functional limitations due to the deficits listed below (see PT Problem List). Pt was unable to participate today fully due to lethargy.  Tearful about her vision and states she "just wants to give up."  Supportive listening to pt.  Will follow acutely and progress as able.   Pt will benefit from skilled PT to increase their independence and safety with mobility to allow discharge to the venue listed below.     Follow Up Recommendations  SNF;Supervision/Assistance - 24 hour     Equipment Recommendations  None recommended by PT    Recommendations for Other Services       Precautions / Restrictions Precautions Precautions: Fall Restrictions Weight Bearing Restrictions: No    Mobility  Bed Mobility Overal bed mobility: Needs Assistance Bed Mobility: Supine to Sit Rolling: Min assist Sidelying to sit: Min assist     Sit to sidelying: Min assist General bed mobility comments: Pt very lethargic and states she cannot get up today.  Assisted back to bed and placed  in chair position as pt kept falling asleep.  Pt tearful as she discusses vision and she is asking for help to see better.  Will contact OT to see if they can help pt.    Transfers                    Ambulation/Gait                 Stairs            Wheelchair Mobility    Modified Rankin (Stroke Patients Only) Modified Rankin (Stroke Patients Only) Pre-Morbid Rankin Score: Slight disability Modified Rankin: Moderately severe disability     Balance Overall balance assessment: Needs assistance Sitting-balance support: Feet supported Sitting balance-Leahy Scale: Fair                                      Cognition Arousal/Alertness: Lethargic;Suspect due to medications Behavior During Therapy: [facility] for tasks assessed/performed Overall Cognitive Status: No family/caregiver present to determine baseline cognitive functioning Area of Impairment: Following commands;Safety/judgement;Problem solving;Attention;Memory                   Current Attention Level: Sustained Memory: Decreased short-term memory Following Commands: Follows one step commands with increased time;Follows multi-step commands inconsistently Safety/Judgement: Decreased awareness of safety;Decreased awareness of deficits   Problem Solving: Slow processing;Decreased initiation;Difficulty sequencing;Requires verbal cues        Exercises      General Comments  Pertinent Vitals/Pain Pain Assessment: No/denies pain  VSS  Home Living                      Prior Function            PT Goals (current goals can now be found in the care plan section) Acute Rehab PT Goals Patient Stated Goal: return home Progress towards PT goals: Not progressing toward goals - comment(lethargy)    Frequency    Min 3X/week      PT Plan Current plan remains appropriate    Co-evaluation              AM-PAC PT "6 Clicks" Daily Activity  Outcome  Measure  Difficulty turning over in bed (including adjusting bedclothes, sheets and blankets)?: Unable Difficulty moving from lying on back to sitting on the side of the bed? : Unable Difficulty sitting down on and standing up from a chair with arms (e.g., wheelchair, bedside commode, etc,.)?: Unable Help needed moving to and from a bed to chair (including a wheelchair)?: A Lot Help needed walking in hospital room?: Total Help needed climbing 3-5 steps with a railing? : Total 6 Click Score: 7    End of Session   Activity Tolerance: Patient limited by fatigue;Patient limited by lethargy Patient left: in bed;with call bell/phone within reach;with bed alarm set Nurse Communication: Mobility status PT Visit Diagnosis: Other abnormalities of gait and mobility (R26.89)     Time: 9678-9381 PT Time Calculation (min) (ACUTE ONLY): 13 min  Charges:  $Therapeutic Activity: 8-22 mins                    G Codes:       Suprina [place],PT Acute Rehabilitation [phone] (pager)    Denice Paradise 09/21/2017, 11:16 AM

## 2017-09-21 NOTE — Clinical Social Work Note (Signed)
Clinical Social Work Assessment  Patient Details  Name: Glenda Johnson MRN: [id number] Date of Birth: 1956-01-29  Date of referral:  09/21/17               Reason for consult:  Facility Placement                Permission sought to share information with:  Facility Sport and exercise psychologist, Family Supports Permission granted to share information::  Yes, Verbal Permission Granted  Name::     Visual merchandiser::  SNF  Relationship::  Sister  Contact Information:     Housing/Transportation Living arrangements for the past 2 months:  [place] of Information:  Patient, Siblings Patient Interpreter Needed:  None Criminal Activity/Legal Involvement Pertinent to Current Situation/Hospitalization:  No - Comment as needed Significant Relationships:  Siblings, Other Family Members Lives with:  Self Do you feel safe going back to the place where you live?  Yes Need for family participation in patient care:  Yes (Comment)(patient having confusion)  Care giving concerns:  Patient lives home alone and will benefit from short term rehab at discharge.   Social Worker assessment / plan:  CSW met with patient to discuss recommendation for SNF. CSW explained referral process and discussed facility options. CSW contacted patient's sister to confirm discussion and ensure discharge plan was appropriate. CSW to continue to follow.  Employment status:  Other (Comment) Insurance information:  Managed Care PT Recommendations:  [place] / Referral to community resources:  [facility]  Patient/Family's Response to care:  Patient and sister agreeable to SNF.  Patient/Family's Understanding of and Emotional Response to Diagnosis, Current Treatment, and Prognosis:  Patient seemed aware of her current situation and discharge plan, but was also saying some things that didn't make sense during discussion; she has been having confusion over hospital stay.  Patient's sister agreed with plan to discharge to [facility].   Emotional Assessment Appearance:  Appears stated age Attitude/Demeanor/Rapport:  Engaged Affect (typically observed):  Pleasant Orientation:  Oriented to Self, Oriented to Place, Oriented to  Time Alcohol / Substance use:  Not Applicable Psych involvement (Current and /or in the community):  No (Comment)  Discharge Needs  Concerns to be addressed:  Care Coordination Readmission within the last 30 days:  No Current discharge risk:  Lives alone, Dependent with Mobility Barriers to Discharge:  Continued Medical Work up, [place], [place] 09/21/2017, 4:19 PM

## 2017-09-21 NOTE — Progress Notes (Addendum)
STROKE TEAM PROGRESS NOTE   SUBJECTIVE (INTERVAL HISTORY) Patient talking on the phone upon my arrival. Upset and anxious when speaking of being sick. No surgery planned at this time. For rehab at [facility].    OBJECTIVE Vitals:   09/20/17 1630 09/20/17 1816 09/20/17 2300 09/21/17 0837  BP: (!) 123/55 (!) 117/44 (!) 122/59   Pulse: (!) 58 75 (!) 59   Resp: 20  18 20   Temp:   (!) 97.5 F (36.4 C) 97.9 F (36.6 C)  TempSrc:   Oral Oral  SpO2:   95%   Weight:      Height:        CBC:  Recent Labs  Lab 09/15/17 2005 09/16/17 0445  09/19/17 0314 09/21/17 0250  WBC 12.3* 9.1   < > 9.3 8.7  NEUTROABS 9.1* 6.0  --   --   --   HGB 11.2* 9.2*   < > 9.5* 9.4*  HCT 38.4 31.0*   < > 32.2* 31.8*  MCV 81.9 79.7   < > 78.3 79.1  PLT 166 126*   < > 247 283   < > = values in this interval not displayed.    Basic Metabolic Panel:  Recent Labs  Lab 09/16/17 0445 09/17/17 0237  09/19/17 0913 09/20/17 0319  NA 133* 129*   < > 129* 129*  K 3.0* 4.6   < > 4.4 4.0  CL 96* 94*   < > 94* 97*  CO2 22 22   < > 23 23  GLUCOSE 145* 123*   < > 94 114*  BUN 29* 35*   < > 49* 43*  CREATININE 1.56* 1.85*   < > 1.93* 1.83*  CALCIUM 7.8* 8.4*   < > 8.5* 7.9*  MG 1.8 2.1  --   --   --   PHOS 2.9 2.8  --   --   --    < > = values in this interval not displayed.    Lipid Panel:     Component Value Date/Time   CHOL 63 09/19/2017 1310   TRIG 57 09/19/2017 1310   HDL 11 (L) 09/19/2017 1310   CHOLHDL 5.7 09/19/2017 1310   VLDL 11 09/19/2017 1310   LDLCALC 41 09/19/2017 1310   HgbA1c:  Lab Results  Component Value Date   HGBA1C 5.3 09/19/2017   Urine Drug Screen:     Component Value Date/Time   LABOPIA NONE DETECTED 09/16/2017 0040   COCAINSCRNUR NONE DETECTED 09/16/2017 0040   LABBENZ NONE DETECTED 09/16/2017 0040   AMPHETMU NONE DETECTED 09/16/2017 0040   THCU NONE DETECTED 09/16/2017 0040   LABBARB NONE DETECTED 09/16/2017 0040     Alcohol Level No results found for:  ETH  IMAGING   Ct Chest Without Contrast  Result Date: 09/20/2017 CLINICAL DATA:  62 year old female with shortness of breath. History of atrial fibrillation. EXAM: CT CHEST WITHOUT CONTRAST TECHNIQUE: Multidetector CT imaging of the chest was performed following the standard protocol without IV contrast. COMPARISON:  No priors. FINDINGS: Cardiovascular: Heart size is borderline enlarged. Small amount of pericardial fluid and/or thickening, unlikely to be of hemodynamic significance at this time, most evident underlying the atrioventricular groove and in superior pericardial recesses. No associated pericardial calcification. Aortic atherosclerosis. No definite coronary artery calcifications are identified. Mediastinum/Nodes: Multiple prominent borderline enlarged mediastinal and hilar lymph nodes are noted, nonspecific. Esophagus is unremarkable in appearance. No axillary lymphadenopathy. Lungs/Pleura: Small right and trace left pleural effusions lying dependently. Areas of dependent  subsegmental atelectasis in the right lower lobe. Patchy areas of predominantly ground-glass attenuation consolidation in the lung bases bilaterally, with somewhat of a peripheral mass-like appearance in the left lower lobe where there is more profound peripheral density with central areas of ground-glass attenuation and septal thickening, best appreciated on axial image 125 of series 7 and sagittal image 76 of series 6 where the largest lesion measures 3.7 x 2.2 x 3.8 cm. Diffuse bronchial wall thickening with mild to moderate centrilobular and paraseptal emphysema. Upper Abdomen: Trace amount of perihepatic ascites. Soft tissue stranding throughout the visualized retroperitoneum. Fluid attenuation in the region of the porta hepatis, suggesting edema. Musculoskeletal: Densely calcified bilateral breast implants. There are no aggressive appearing lytic or blastic lesions noted in the visualized portions of the skeleton.  IMPRESSION: 1. Multifocal opacities in the lung bases bilaterally which have an unusual appearance. These are favored to reflect potential areas of cryptogenic organizing pneumonia (COP) based on the appearance of the largest lesion in the posterior left lower lobe which has a suggestion of the so-called "atoll sign". However, the overall appearance is nonspecific. Differential considerations include atypical infectious etiologies, areas of alveolar hemorrhage in the setting of multifocal pulmonary infarction, and even neoplasm such as adenocarcinoma. At this time, repeat noncontrast chest CT is suggested in 3-6 weeks to re-evaluate these findings. If there is any clinical concern for acute pulmonary embolus at this time, further evaluation with PE protocol CT would be recommended. 2. Small right and trace left pleural effusions lying dependently. 3. Small amount of pericardial fluid and/or thickening, unlikely to be of hemodynamic significance at this time. 4. Mild diffuse bronchial wall thickening and mild to moderate centrilobular and paraseptal emphysema; imaging findings compatible with the clinical history of COPD. 5. Aortic atherosclerosis. These results were called by telephone at the time of interpretation on 09/20/2017 at 5:42 pm to Dr. Ivin Poot, who verbally acknowledged these results. Aortic Atherosclerosis (ICD10-I70.0) and Emphysema (ICD10-J43.9). Electronically Signed   By: Vinnie Langton M.D.   On: 09/20/2017 17:43      PHYSICAL EXAM GENERAL: thin pale female lying in the bed. anxious. A&O x 3 HEENT: - Normocephalic and atraumatic LUNGS - clear to ausculation Ext: warm, well perfused, intact peripheral pulses, no edema   NEURO:  Mental Status: alert & oriented x 3.  Language: speech is clear. Naming, repetition, fluency, and comprehension intact. Flight of ideas, difficult to direct conversation. Cranial Nerves: PERRL 3 mm/brisk. Able to recognize movement on the R but unable  to count fingers. Blinks to threat.. no facial asymmetry, facial sensation intact, hearing intact, tongue/uvula/soft palate midline, No evidence of tongue atrophy or fibrillations.  Motor: normal strenthTone: is normal and bulk is normal Sensation- L sided neglect to touch and double simultaneous stimulation Coordination: intact   ASSESSMENT/PLAN Glenda Johnson is a 62 y.o. female with history of COPD, atrial fibrillation (non compliant with anticoagulation), HTN, Hep C, and diastolic heart failure admitted on 3/23 with acute respiratory failure due to pulmonary edema and hypertensive emergency. Admitted to ICU on bipap and treated w/ IV diuretics. LV mass found - myxoma vs thrombus. In hospital she developed B blurry vision. MRI showed multiple acute B infarcts.   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 Yes; Fluid consistency: Thin  aspirin 81 mg daily prior to admission, now on aspirin 81 mg daily and warfarin. Agree with anticoagulation.  Patient counseled to be compliant with her antithrombotic medications. Pharmacist education  Ongoing aggressive stroke risk factor management  Therapy recommendations:  SNF  Disposition:  [place]  Nothing further to add from stroke standpoint, will sign off. Follow up in clinic in 4 weeks. Order written.  LV  mass  thrombus vs myxoma  Repeat echo w/ contrast - attached on a stalk at apical septum  CT chest - multifocal lung opacities possible PNA but overall nonspecific, diff includes PE  TCTS does not feel a surgical candidate  On  warfarin. 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 I have made any additions or clarifications directly to the above note.   CVT consulted and felt more likely LV thrombus instead of myxoma. Recommend anticoagulation for 8 week and then repeat TTE. Today INR 2.37, therapeutic. Agree with continue coumadin for anticoagulation.    Neurology will sign off. Please call with questions. Pt will follow up with Dr. Erlinda Hong at [facility] in about 4 weeks. Thanks for the consult.  Rosalin Hawking, MD PhD Stroke Neurology 09/21/2017 4:41 PM   To contact Stroke Continuity provider, please refer to [email or web address] After hours, contact General Neurology

## 2017-09-21 NOTE — Progress Notes (Signed)
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 MRI suggests the mass is c/w thrombus  She has been seen by Dr. Prescott Gum .  She is a poor surgical candidate .  Will treat this mass with anticoagulants and repeat echo in several months   Inpatient Medications    Scheduled Meds: . aspirin EC  81 mg Oral Daily  . carvedilol  12.5 mg Oral BID WC  . feeding supplement  1 Container Oral TID BM  . gi cocktail  30 mL Oral Once  . mouth rinse  15 mL Mouth Rinse BID  . pantoprazole  40 mg Oral Q1200  . Warfarin - Pharmacist Dosing Inpatient   Does not apply q1800   Continuous Infusions: . sodium chloride    . sodium chloride 75 mL/hr at 09/20/17 0408  . sodium chloride     PRN Meds: sodium chloride, acetaminophen, ipratropium-albuterol, LORazepam, ondansetron (ZOFRAN) IV, prochlorperazine   Vital Signs    Vitals:   09/20/17 1630 09/20/17 1816 09/20/17 2300 09/21/17 0837  BP: (!) 123/55 (!) 117/44 (!) 122/59   Pulse: (!) 58 75 (!) 59   Resp: 20  18 20   Temp:   (!) 97.5 F (36.4 C) 97.9 F (36.6 C)  TempSrc:   Oral Oral  SpO2:   95%   Weight:      Height:        Intake/Output Summary (Last 24 hours) at 09/21/2017 0954 Last data filed at 09/21/2017 0858 Gross per 24 hour  Intake 1124 ml  Output 225 ml  Net 899 ml   Filed Weights   09/18/17 0500 09/19/17 0450 09/20/17 0300  Weight: 133 lb 9.6 oz (60.6 kg) 134 lb 7.7 oz (61 kg) 134 lb 14.7 oz (61.2 kg)    Telemetry    NSR  - Personally Reviewed  ECG     NSR  - Personally Reviewed  Physical Exam     GEN: No acute distress.  Sleepy  Neck: No JVD Cardiac: RRR, no murmurs, rubs, or gallops.  Breast implants  Respiratory: Clear to auscultation bilaterally. GI: Soft, nontender, non-distended  MS: No edema; No deformity. Neuro:  Nonfocal  Psych: Normal affect   Labs    Chemistry Recent Labs  Lab 09/16/17 0445 09/17/17 0237 09/18/17 0238 09/19/17 0913 09/20/17 0319 09/21/17 0250  NA 133* 129* 127* 129* 129*  --   K 3.0* 4.6 5.1 4.4 4.0  --   CL 96* 94* 90* 94* 97*  --   CO2 22 22 20* 23 23  --   GLUCOSE 145* 123* 114* 94 114*  --   BUN 29* 35* 48* 49* 43*  --   CREATININE 1.56* 1.85* 2.22* 1.93* 1.83*  --   CALCIUM 7.8* 8.4* 8.7* 8.5* 7.9*  --   PROT 5.5* 5.9*  --   --   --  5.6*  ALBUMIN 2.7* 3.0*  --   --   --  2.7*  AST 92* 65*  --   --   --  23  ALT 123* 96*  --   --   --  44  ALKPHOS 77 84  --   --   --  65  BILITOT 2.1* 1.9*  --   --   --  1.3*  GFRNONAA 34* 28* 22* 26* 28*  --   GFRAA 40* 32* 26* 31* 33*  --   ANIONGAP 15 13 17* 12 9  --      Hematology Recent Labs  Lab 09/18/17 0238 09/19/17 0314 09/21/17 0250  WBC 12.1* 9.3 8.7  RBC 4.50 4.11 4.02  HGB 10.5* 9.5* 9.4*  HCT 35.4* 32.2* 31.8*  MCV 78.7 78.3 79.1  MCH 23.3* 23.1* 23.4*  MCHC 29.7* 29.5* 29.6*  RDW 16.0* 16.2* 16.0*  PLT 222 247 283    Cardiac Enzymes Recent Labs  Lab 09/16/17 0128 09/16/17 0445 09/16/17 1036 09/19/17 0913  TROPONINI 8.63* 9.29* 8.99* 3.29*    Recent Labs  Lab 09/15/17 1906  TROPIPOC 5.50*     BNP Recent Labs  Lab 09/15/17 2006 09/16/17 0445 09/17/17 0237  BNP >4,500.0* >4,500.0* 3,035.3*     DDimer No results for input(s): DDIMER in the last 168 hours.   Radiology    Ct Head Wo Contrast  Result Date: 09/20/2017 CLINICAL DATA:  62 y/o  F; numbness, tingling, and paresthesias. EXAM: CT HEAD WITHOUT CONTRAST TECHNIQUE: Contiguous axial images were obtained from the base of the skull through the vertex without intravenous contrast. COMPARISON:   09/19/2017 MRI of the head. FINDINGS: Brain: Small focus of infarction within the right occipitotemporal junction and multiple small foci of infarction throughout the cerebral hemispheres and left cerebellum are stable in comparison with the prior MRI given differences in technique. No significant associated mass effect. No hemorrhage identified. No new acute intracranial abnormality. No effacement of basilar cisterns, hydrocephalus, or extra-axial collection. Vascular: No hyperdense vessel or unexpected calcification. Skull: Normal. Negative for fracture or focal lesion. Sinuses/Orbits: No acute finding. Other: None. IMPRESSION: 1. Right occipitotemporal junction infarction as well as multiple additional small infarctions throughout the cerebral hemispheres and left cerebellum are stable in comparison with the prior MRI given differences in technique. No associated hemorrhage or mass effect. 2. No new acute intracranial abnormality identified. Electronically Signed   By: Kristine Garbe M.D.   On: 09/20/2017 01:04   Ct Chest Without Contrast  Result Date: 09/20/2017 CLINICAL DATA:  62 year old female with shortness of breath. History of atrial fibrillation. EXAM: CT CHEST WITHOUT CONTRAST TECHNIQUE: Multidetector CT imaging of the chest was performed following the standard protocol without IV contrast. COMPARISON:  No priors. FINDINGS: Cardiovascular: Heart size is borderline enlarged. Small amount of pericardial fluid and/or thickening, unlikely to be of hemodynamic significance at this time, most evident underlying the atrioventricular groove and in superior pericardial recesses. No associated pericardial calcification. Aortic atherosclerosis. No definite coronary artery calcifications are identified. Mediastinum/Nodes: Multiple prominent borderline enlarged mediastinal and hilar lymph nodes are noted, nonspecific. Esophagus is unremarkable in appearance. No axillary lymphadenopathy. Lungs/Pleura:  Small right and trace left pleural effusions lying dependently. Areas of dependent subsegmental atelectasis in the right lower lobe. Patchy areas of predominantly ground-glass attenuation consolidation in the lung bases bilaterally, with somewhat of a peripheral mass-like appearance in the left lower lobe where there is more profound peripheral density with central areas of ground-glass attenuation and septal thickening, best appreciated on axial image 125 of series 7 and sagittal image 76 of series 6 where the largest lesion measures 3.7 x 2.2 x 3.8 cm. Diffuse bronchial wall thickening with mild to moderate centrilobular and paraseptal emphysema. Upper  Abdomen: Trace amount of perihepatic ascites. Soft tissue stranding throughout the visualized retroperitoneum. Fluid attenuation in the region of the porta hepatis, suggesting edema. Musculoskeletal: Densely calcified bilateral breast implants. There are no aggressive appearing lytic or blastic lesions noted in the visualized portions of the skeleton. IMPRESSION: 1. Multifocal opacities in the lung bases bilaterally which have an unusual appearance. These are favored to reflect potential areas of cryptogenic organizing pneumonia (COP) based on the appearance of the largest lesion in the posterior left lower lobe which has a suggestion of the so-called "atoll sign". However, the overall appearance is nonspecific. Differential considerations include atypical infectious etiologies, areas of alveolar hemorrhage in the setting of multifocal pulmonary infarction, and even neoplasm such as adenocarcinoma. At this time, repeat noncontrast chest CT is suggested in 3-6 weeks to re-evaluate these findings. If there is any clinical concern for acute pulmonary embolus at this time, further evaluation with PE protocol CT would be recommended. 2. Small right and trace left pleural effusions lying dependently. 3. Small amount of pericardial fluid and/or thickening, unlikely to be  of hemodynamic significance at this time. 4. Mild diffuse bronchial wall thickening and mild to moderate centrilobular and paraseptal emphysema; imaging findings compatible with the clinical history of COPD. 5. Aortic atherosclerosis. These results were called by telephone at the time of interpretation on 09/20/2017 at 5:42 pm to Dr. Ivin Poot, who verbally acknowledged these results. Aortic Atherosclerosis (ICD10-I70.0) and Emphysema (ICD10-J43.9). Electronically Signed   By: Vinnie Langton M.D.   On: 09/20/2017 17:43   Mr Brain Wo Contrast  Result Date: 09/19/2017 CLINICAL DATA:  Stroke EXAM: MRI HEAD WITHOUT CONTRAST TECHNIQUE: Multiplanar, multiecho pulse sequences of the brain and surrounding structures were obtained without intravenous contrast. COMPARISON:  None. FINDINGS: Brain: Multiple acute infarcts are present bilaterally. Largest infarct in the right occipital lobe. Numerous small cortical and subcortical infarcts in both cerebral hemispheres. Small acute infarcts left cerebellum. Negative for hemorrhage or mass. Ventricle size normal. No midline shift. Minimal if any chronic ischemia. Vascular: Normal arterial flow voids Skull and upper cervical spine: Negative Sinuses/Orbits: Negative Other: None IMPRESSION: Multiple acute infarcts bilaterally consistent with cerebral emboli. No midline shift or hemorrhage. Electronically Signed   By: Franchot Gallo M.D.   On: 09/19/2017 11:40    Cardiac Studies      Patient Profile     62 y.o. female with LV mass with multiple embolic strokes   Assessment & Plan    1.  LV mass:   She is not a candidate for surgery to remove the mass. Will continue with anticoagulation and see if it resolves.  INR is theraputic  She will be going to SNF .   Will follow up with Dr. Angelena Form in the office   Will sign off Call for questions   For questions or updates, please contact [place] Please consult [email or web address] for contact info under  Cardiology/STEMI.      Signed, Mertie Moores, MD  09/21/2017, 9:54 AM

## 2017-09-21 NOTE — Care Management Note (Signed)
Case Management Note  Patient Details  Name: Glenda Johnson MRN: [id number] Date of Birth: 09-Jun-1956  Subjective/Objective:                    Action/Plan: Plan is for SNF when medically stable. CM following for d/c disposition.    Expected Discharge Date:  09/21/17               Expected Discharge Plan:  Home/Self Care  In-House Referral:     Discharge planning Services  CM Consult  Post Acute Care Choice:    Choice offered to:     DME Arranged:    DME Agency:     HH Arranged:    HH Agency:     Status of Service:  In process, will continue to follow  If discussed at Long Length of Stay Meetings, dates discussed:    Additional Comments:  Pollie Friar, RN 09/21/2017, 10:37 AM

## 2017-09-21 NOTE — NC FL2 (Signed)
[place] LEVEL OF CARE SCREENING TOOL     IDENTIFICATION  Patient Name: Glenda Johnson Birthdate: [birth year] Sex: female Admission Date (Current Location): 09/15/2017  [facility] and [place] Number:  Herbalist and Address:  [facility] [place] [place] [address], [place], [place] [address]      Provider Number: [id number]  Attending Physician Name and Address:  Caren Griffins, MD  Relative Name and Phone Number:       Current Level of Care: Hospital Recommended Level of Care: [place] Prior Approval Number:    Date Approved/Denied:   PASRR Number: [id number]  Discharge Plan: SNF    Current Diagnoses: Patient Active Problem List   Diagnosis Date Noted  . Cerebral thrombosis with cerebral infarction 09/20/2017  . Congestive heart failure ([place])   . Cerebrovascular accident (CVA) due to embolism of cerebral artery ([place])   . Cardiac mass   . Anticoagulation adequate   . Flash pulmonary edema ([place]) 09/15/2017  . Community acquired pneumonia   . Abnormal echocardiogram   . Hypoxia 05/22/2016  . Acute respiratory failure with hypoxia ([place]) 05/22/2016  . Acute respiratory failure ([place]) 05/22/2016  . Sepsis ([place]) 05/22/2016  . Elevated troponin 05/22/2016  . COPD (chronic obstructive pulmonary disease) ([place]) 05/22/2016  . AKI (acute kidney injury) ([place]) 05/22/2016  . Acute pulmonary edema ([facility])     Orientation RESPIRATION BLADDER Height & Weight     Self, Time, Place  Normal Continent Weight: 134 lb 14.7 oz (61.2 kg) Height:  5\' 7"  (170.2 cm)  BEHAVIORAL SYMPTOMS/MOOD NEUROLOGICAL BOWEL NUTRITION STATUS      Continent Diet(heart healthy)  AMBULATORY STATUS COMMUNICATION OF NEEDS Skin   Limited Assist Verbally Normal                       Personal Care Assistance Level of Assistance  Bathing, Feeding, Dressing Bathing Assistance: Limited assistance Feeding assistance: Independent Dressing  Assistance: Limited assistance     Functional Limitations Info  Sight, Hearing, Speech Sight Info: Impaired(blurred; wears glasses) Hearing Info: Adequate Speech Info: Adequate    SPECIAL CARE FACTORS FREQUENCY  PT (By licensed PT), OT (By licensed OT)     PT Frequency: 5x/wk OT Frequency: 5x/wk            Contractures Contractures Info: Not present    Additional Factors Info  Code Status, Allergies Code Status Info: Partial Allergies Info: Codeine, Tramadol, Clarithromycin, Citalopram, Fish Allergy, Other, Wellbutrin Bupropion           Current Medications (09/21/2017):  This is the current hospital active medication list Current Facility-Administered Medications  Medication Dose Route Frequency Provider Last Rate Last Dose  . 0.9 %  sodium chloride infusion  250 mL Intravenous PRN Chesley Mires, MD      . 0.9 %  sodium chloride infusion   Intravenous Continuous Kerney Elbe, MD 75 mL/hr at 09/21/17 0920    . acetaminophen (TYLENOL) tablet 650 mg  650 mg Oral Q4H PRN Chesley Mires, MD   650 mg at 09/16/17 1533  . aspirin EC tablet 81 mg  81 mg Oral Daily Chesley Mires, MD   81 mg at 09/21/17 0942  . carvedilol (COREG) tablet 12.5 mg  12.5 mg Oral BID WC Chesley Mires, MD   12.5 mg at 09/21/17 0900  . feeding supplement (BOOST / RESOURCE BREEZE) liquid 1 Container  1 Container Oral TID BM Domenic Polite, MD   1  Container at 09/19/17 2059  . gi cocktail (Maalox,Lidocaine,Donnatal)  30 mL Oral Once Chesley Mires, MD      . ipratropium-albuterol (DUONEB) 0.5-2.5 (3) MG/3ML nebulizer solution 3 mL  3 mL Nebulization Q4H PRN Chesley Mires, MD      . LORazepam (ATIVAN) tablet 0.5 mg  0.5 mg Oral Q6H PRN Chesley Mires, MD   0.5 mg at 09/21/17 0128  . MEDLINE mouth rinse  15 mL Mouth Rinse BID Hammonds, Sharyn Blitz, MD   15 mL at 09/19/17 2100  . ondansetron (ZOFRAN) injection 4 mg  4 mg Intravenous Q6H PRN Chesley Mires, MD   4 mg at 09/20/17 2114  . pantoprazole (PROTONIX) EC tablet 40  mg  40 mg Oral Q1200 Domenic Polite, MD      . prochlorperazine (COMPAZINE) injection 10 mg  10 mg Intravenous Q4H PRN Chesley Mires, MD   10 mg at 09/16/17 2107  . sodium chloride 0.9 % bolus 500 mL  500 mL Intravenous Once Kerney Elbe, MD      . warfarin (COUMADIN) tablet 5 mg  5 mg Oral ONCE-1800 Caren Griffins, MD      . Warfarin - Pharmacist Dosing Inpatient   Does not apply [id number] Domenic Polite, MD   Stopped at 09/20/17 1800     Discharge Medications: Please see discharge summary for a list of discharge medications.  Relevant Imaging Results:  Relevant Lab Results:   Additional Information SS#: [id number]  Geralynn Ochs, LCSW

## 2017-09-21 NOTE — Discharge Instructions (Signed)

## 2017-09-21 NOTE — Progress Notes (Signed)
[place] for Warfarin Indication: LV thrombus  Allergies  Allergen Reactions  . Codeine Anaphylaxis  . Tramadol Anaphylaxis    No hydrocodone or anything connected No hydrocodone or anything connected   . Clarithromycin Nausea And Vomiting  . Citalopram Other (See Comments)    Caused more depression   . Fish Allergy Itching    ears  . Other     Patient says that all narcotics give her anaphylaxis.   . Wellbutrin [Bupropion] Other (See Comments)    "could not speak a full sentence for a whole year"    Patient Measurements: Height: 5\' 7"  (170.2 cm) Weight: 134 lb 14.7 oz (61.2 kg) IBW/kg (Calculated) : 61.6   Vital Signs: Temp: 97.9 F (36.6 C) (03/29 0837) Temp Source: Oral (03/29 0837) BP: 122/59 (03/28 2300) Pulse Rate: 59 (03/28 2300)  Labs: Recent Labs    09/19/17 0314 09/19/17 0913 09/19/17 1836 09/20/17 0319 09/20/17 0906 09/21/17 0250  HGB 9.5*  --   --   --   --  9.4*  HCT 32.2*  --   --   --   --  31.8*  PLT 247  --   --   --   --  283  LABPROT 19.5*  --   --   --  31.4* 25.9*  INR 1.66  --   --   --  3.07 2.39  HEPARINUNFRC 0.30  --  0.43 0.33  --   --   CREATININE  --  1.93*  --  1.83*  --   --   TROPONINI  --  3.29*  --   --   --   --     Estimated Creatinine Clearance: 30.4 mL/min (A) (by C-G formula based on SCr of 1.83 mg/dL (H)).   Assessment: 62 year old female continues on warfarin for LV thrombus. Last dose of sq Lovenox at 12:30 pm today  INR today = 2.39  Goal of Therapy:  Heparin level 0.3-0.7 units/ml Monitor platelets by anticoagulation protocol: Yes  INR = 2 to 3   Plan:  Warfarin 5 mg po x 1 today at  1800 pm Daily INR  Thank you Anette Guarneri, PharmD [phone] 09/21/2017,10:34 AM

## 2017-09-21 NOTE — Progress Notes (Signed)
PROGRESS NOTE    Glenda Johnson  OAC:[id number] DOB: 08/04/1955 DOA: 09/15/2017 PCP: Paris Lore, MD    Brief Narrative:  62 year old female with a past medical history significant for COPD, paroxysmal atrial fibrillation-declined anticoagulation, chronic diastolic and systolic CHF, hepatitis C, presented to the emergency room with respiratory distress from pulmonary edema, elevated cardiac enzymes, admitted to the ICU on BiPAP treated with diuretics. 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 Patient also with underlying COPD. Improved with diuretics, however in the settings of worsening creatinine lasix and aldactone were stopped.patient is currently on room air and maintaining oxygen saturation in the 90s. 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 Recommend repeat Echo in 8 weeks.  In the meantime will continue anticoagulation.  INR is therapeutic today at 2.39. --Continue  5 mg daily, management per pharmacy --Repeat echo in 8 weeks  #Embolic stroke Likely secondary to the setting of LV thrombus atrial fibrillation not on anticoagulation prior to admission.  INR is therapeutic at 2.39 today, will continue Coumadin.  Patient endorses mild improvement in vision.  Patient is at high risk for repeat stroke if not anticoagulated.  Carotid Doppler with minimal stenosis.  Lower extremity Doppler also negative for DVT. --Follow-up neurology consult, appreciate recommendations  #CAD status post NSTEMI/ Versus demand ischemia Cardiology has  signed off given patient is not a candidate for thoracic surgery in this acute setting.  Recommended continue anticoagulation and outpatient follow-up.  Troponins continued to down trend significantly.  Thought to be secondary to demand.   #COPD/tobacco abuse, Stable No wheezing noted.  Currently  oxygen saturation 96 % on room air. --Continue DuoNeb every 4 as needed --Oxygen therapy as needed  #AKI on CKd stag Creatinine this morning is 1.64 down from 1.83.  Lasix and aldactone have been held.    #History of hepatitis C -s/p Rx, Fu with GI at [facility]  DVT prophylaxis: Warfarin Code Status: Full Code Family Communication: none at bedside Disposition Plan: SNF placement in process  Consultants:  Cardiology, Neurology, cardiothoracic surgery  Procedures:  None  Antimicrobials:  None  Subjective: Patient reports mild improvement in vision.  Still feeling fatigued.  Denies any shortness of breath, chest pain, pain, nausea/vomiting.  Objective: Vitals:   09/20/17 1630 09/20/17 1816 09/20/17 2300 09/21/17 0837  BP: (!) 123/55 (!) 117/44 (!) 122/59   Pulse: (!) 58 75 (!) 59   Resp: 20  18 20   Temp:   (!) 97.5 F (36.4 C) 97.9 F (36.6 C)  TempSrc:   Oral Oral  SpO2:   95%   Weight:      Height:        Intake/Output Summary (Last 24 hours) at 09/21/2017 0852 Last data filed at 09/20/2017 2300 Gross per 24 hour  Intake 920 ml  Output 325 ml  Net 595 ml   Filed Weights   09/18/17 0500 09/19/17 0450 09/20/17 0300  Weight: 133 lb 9.6 oz (60.6 kg) 134 lb 7.7 oz (61 kg) 134 lb 14.7 oz (61.2 kg)    Examination:  Gen: somnolent, arousable, no distress, awakens and answers questions  appropriately  HEENT: PERRLA, Neck supple, no JVD.   Blurry vision reported, improved with left eye covered. Lungs: Good air movement bilaterally, CTAB CVS: RRR,No Gallops,Rubs or new Murmurs Abd: soft, Non tender, non distended, BS present Extremities: no edema Motor 4+/5, sensory light  touch intact, DTR 2+, plantars withdrawal Psychiatry: flat affect    Data Reviewed:   CBC: Recent Labs  Lab 09/15/17 2005 09/16/17 0445 09/17/17 0237 09/18/17 0238 09/19/17 0314 09/21/17 0250  WBC 12.3* 9.1 12.3* 12.1* 9.3 8.7  NEUTROABS 9.1* 6.0  --   --   --   --   HGB 11.2* 9.2* 10.1* 10.5* 9.5* 9.4*  HCT 38.4 31.0* 33.7* 35.4* 32.2* 31.8*  MCV 81.9 79.7 79.3 78.7 78.3 79.1  PLT 166 126* 171 222 247 761   Basic Metabolic Panel: Recent Labs  Lab 09/16/17 0128 09/16/17 0445 09/17/17 0237 09/18/17 0238 09/19/17 0913 09/20/17 0319  NA  --  133* 129* 127* 129* 129*  K  --  3.0* 4.6 5.1 4.4 4.0  CL  --  96* 94* 90* 94* 97*  CO2  --  22 22 20* 23 23  GLUCOSE  --  145* 123* 114* 94 114*  BUN  --  29* 35* 48* 49* 43*  CREATININE  --  1.56* 1.85* 2.22* 1.93* 1.83*  CALCIUM  --  7.8* 8.4* 8.7* 8.5* 7.9*  MG 2.0 1.8 2.1  --   --   --   PHOS 3.1 2.9 2.8  --   --   --    GFR: Estimated Creatinine Clearance: 30.4 mL/min (A) (by C-G formula based on SCr of 1.83 mg/dL (H)). Liver Function Tests: Recent Labs  Lab 09/15/17 2005 09/16/17 0445 09/17/17 0237 09/21/17 0250  AST 116* 92* 65* 23  ALT 166* 123* 96* 44  ALKPHOS 106 77 84 65  BILITOT 1.9* 2.1* 1.9* 1.3*  PROT 6.9 5.5* 5.9* 5.6*  ALBUMIN 3.4* 2.7* 3.0* 2.7*   Recent Labs  Lab 09/16/17 0128  LIPASE 34   No results for input(s): AMMONIA in the last 168 hours. Coagulation Profile: Recent Labs  Lab 09/19/17 0314 09/20/17 0906 09/21/17 0250  INR 1.66 3.07 2.39   Cardiac Enzymes: Recent Labs  Lab 09/16/17 0128 09/16/17 0445 09/16/17 1036 09/19/17 0913  TROPONINI 8.63* 9.29* 8.99* 3.29*   BNP (last 3 results) No results for input(s): PROBNP in the last 8760 hours. HbA1C: Recent Labs    09/19/17 1310  HGBA1C 5.3   CBG: No results for input(s): GLUCAP in the last 168 hours. Lipid Profile: Recent Labs    09/19/17 1310  CHOL 63  HDL 11*  LDLCALC 41  TRIG 57  CHOLHDL 5.7   Thyroid  Function Tests: No results for input(s): TSH, T4TOTAL, FREET4, T3FREE, THYROIDAB in the last 72 hours. Anemia Panel: No results for input(s): VITAMINB12, FOLATE, FERRITIN, TIBC, IRON, RETICCTPCT in the last 72 hours. Urine analysis:    Component Value Date/Time   COLORURINE YELLOW 09/16/2017 0040   APPEARANCEUR CLEAR 09/16/2017 0040   LABSPEC 1.009 09/16/2017 0040   PHURINE 6.0 09/16/2017 0040   GLUCOSEU NEGATIVE 09/16/2017 0040   HGBUR SMALL (A) 09/16/2017 0040   BILIRUBINUR NEGATIVE 09/16/2017 0040   KETONESUR NEGATIVE 09/16/2017 0040   PROTEINUR 100 (A) 09/16/2017 0040   NITRITE NEGATIVE 09/16/2017 0040   LEUKOCYTESUR NEGATIVE 09/16/2017 0040   Sepsis Labs: @LABRCNTIP (procalcitonin:4,lacticidven:4)  ) Recent Results (from the past 240 hour(s))  Blood Culture (routine x 2)     Status: None  Collection Time: 09/15/17  7:50 PM  Result Value Ref Range Status   Specimen Description BLOOD BLOOD LEFT FOREARM  Final   Special Requests   Final    BOTTLES DRAWN AEROBIC AND ANAEROBIC Blood Culture adequate volume   Culture   Final    NO GROWTH 5 DAYS Performed at [facility], [address], [place], [place] [address]    Report Status 09/20/2017 FINAL  Final  Blood Culture (routine x 2)     Status: None   Collection Time: 09/15/17  8:00 PM  Result Value Ref Range Status   Specimen Description BLOOD BLOOD RIGHT HAND  Final   Special Requests   Final    BOTTLES DRAWN AEROBIC AND ANAEROBIC Blood Culture adequate volume   Culture   Final    NO GROWTH 5 DAYS Performed at [facility], [place] [address], [place], [place] [address]    Report Status 09/20/2017 FINAL  Final  MRSA PCR Screening     Status: None   Collection Time: 09/16/17 12:36 AM  Result Value Ref Range Status   MRSA by PCR NEGATIVE NEGATIVE Final    Comment:        The GeneXpert MRSA Assay (FDA approved for NASAL specimens only), is one component of a comprehensive MRSA colonization surveillance  program. It is not intended to diagnose MRSA infection nor to guide or monitor treatment for MRSA infections. Performed at [facility], [place] [address], [place], [place] [address]          Radiology Studies: Ct Head Wo Contrast  Result Date: 09/20/2017 CLINICAL DATA:  62 y/o  F; numbness, tingling, and paresthesias. EXAM: CT HEAD WITHOUT CONTRAST TECHNIQUE: Contiguous axial images were obtained from the base of the skull through the vertex without intravenous contrast. COMPARISON:  09/19/2017 MRI of the head. FINDINGS: Brain: Small focus of infarction within the right occipitotemporal junction and multiple small foci of infarction throughout the cerebral hemispheres and left cerebellum are stable in comparison with the prior MRI given differences in technique. No significant associated mass effect. No hemorrhage identified. No new acute intracranial abnormality. No effacement of basilar cisterns, hydrocephalus, or extra-axial collection. Vascular: No hyperdense vessel or unexpected calcification. Skull: Normal. Negative for fracture or focal lesion. Sinuses/Orbits: No acute finding. Other: None. IMPRESSION: 1. Right occipitotemporal junction infarction as well as multiple additional small infarctions throughout the cerebral hemispheres and left cerebellum are stable in comparison with the prior MRI given differences in technique. No associated hemorrhage or mass effect. 2. No new acute intracranial abnormality identified. Electronically Signed   By: Kristine Garbe M.D.   On: 09/20/2017 01:04   Ct Chest Without Contrast  Result Date: 09/20/2017 CLINICAL DATA:  62 year old female with shortness of breath. History of atrial fibrillation. EXAM: CT CHEST WITHOUT CONTRAST TECHNIQUE: Multidetector CT imaging of the chest was performed following the standard protocol without IV contrast. COMPARISON:  No priors. FINDINGS: Cardiovascular: Heart size is borderline enlarged. Small amount of  pericardial fluid and/or thickening, unlikely to be of hemodynamic significance at this time, most evident underlying the atrioventricular groove and in superior pericardial recesses. No associated pericardial calcification. Aortic atherosclerosis. No definite coronary artery calcifications are identified. Mediastinum/Nodes: Multiple prominent borderline enlarged mediastinal and hilar lymph nodes are noted, nonspecific. Esophagus is unremarkable in appearance. No axillary lymphadenopathy. Lungs/Pleura: Small right and trace left pleural effusions lying dependently. Areas of dependent subsegmental atelectasis in the right lower lobe. Patchy areas of predominantly ground-glass attenuation  consolidation in the lung bases bilaterally, with somewhat of a peripheral mass-like appearance in the left lower lobe where there is more profound peripheral density with central areas of ground-glass attenuation and septal thickening, best appreciated on axial image 125 of series 7 and sagittal image 76 of series 6 where the largest lesion measures 3.7 x 2.2 x 3.8 cm. Diffuse bronchial wall thickening with mild to moderate centrilobular and paraseptal emphysema. Upper Abdomen: Trace amount of perihepatic ascites. Soft tissue stranding throughout the visualized retroperitoneum. Fluid attenuation in the region of the porta hepatis, suggesting edema. Musculoskeletal: Densely calcified bilateral breast implants. There are no aggressive appearing lytic or blastic lesions noted in the visualized portions of the skeleton. IMPRESSION: 1. Multifocal opacities in the lung bases bilaterally which have an unusual appearance. These are favored to reflect potential areas of cryptogenic organizing pneumonia (COP) based on the appearance of the largest lesion in the posterior left lower lobe which has a suggestion of the so-called "atoll sign". However, the overall appearance is nonspecific. Differential considerations include atypical infectious  etiologies, areas of alveolar hemorrhage in the setting of multifocal pulmonary infarction, and even neoplasm such as adenocarcinoma. At this time, repeat noncontrast chest CT is suggested in 3-6 weeks to re-evaluate these findings. If there is any clinical concern for acute pulmonary embolus at this time, further evaluation with PE protocol CT would be recommended. 2. Small right and trace left pleural effusions lying dependently. 3. Small amount of pericardial fluid and/or thickening, unlikely to be of hemodynamic significance at this time. 4. Mild diffuse bronchial wall thickening and mild to moderate centrilobular and paraseptal emphysema; imaging findings compatible with the clinical history of COPD. 5. Aortic atherosclerosis. These results were called by telephone at the time of interpretation on 09/20/2017 at 5:42 pm to Dr. Ivin Poot, who verbally acknowledged these results. Aortic Atherosclerosis (ICD10-I70.0) and Emphysema (ICD10-J43.9). Electronically Signed   By: Vinnie Langton M.D.   On: 09/20/2017 17:43   Mr Brain Wo Contrast  Result Date: 09/19/2017 CLINICAL DATA:  Stroke EXAM: MRI HEAD WITHOUT CONTRAST TECHNIQUE: Multiplanar, multiecho pulse sequences of the brain and surrounding structures were obtained without intravenous contrast. COMPARISON:  None. FINDINGS: Brain: Multiple acute infarcts are present bilaterally. Largest infarct in the right occipital lobe. Numerous small cortical and subcortical infarcts in both cerebral hemispheres. Small acute infarcts left cerebellum. Negative for hemorrhage or mass. Ventricle size normal. No midline shift. Minimal if any chronic ischemia. Vascular: Normal arterial flow voids Skull and upper cervical spine: Negative Sinuses/Orbits: Negative Other: None IMPRESSION: Multiple acute infarcts bilaterally consistent with cerebral emboli. No midline shift or hemorrhage. Electronically Signed   By: Franchot Gallo M.D.   On: 09/19/2017 11:40         Scheduled Meds: . aspirin EC  81 mg Oral Daily  . carvedilol  12.5 mg Oral BID WC  . feeding supplement  1 Container Oral TID BM  . gi cocktail  30 mL Oral Once  . mouth rinse  15 mL Mouth Rinse BID  . pantoprazole  40 mg Oral Q1200  . Warfarin - Pharmacist Dosing Inpatient   Does not apply q1800   Continuous Infusions: . sodium chloride    . sodium chloride 75 mL/hr at 09/20/17 0408  . sodium chloride       LOS: 6 days    Time spent: 29min   [facility] Page via [email or web address], password [email or web address] After 7PM please contact night-coverage  09/21/2017, 8:52 AM

## 2017-09-22 DIAGNOSIS — R1084 Generalized abdominal pain: Secondary | ICD-10-CM

## 2017-09-22 LAB — PROTIME-INR
INR: 2.3
Prothrombin Time: 25.1 seconds — ABNORMAL HIGH (ref 11.4–15.2)

## 2017-09-22 LAB — CBC
HEMATOCRIT: 33.6 % — AB (ref 36.0–46.0)
Hemoglobin: 10 g/dL — ABNORMAL LOW (ref 12.0–15.0)
MCH: 23.8 pg — AB (ref 26.0–34.0)
MCHC: 29.8 g/dL — ABNORMAL LOW (ref 30.0–36.0)
MCV: 79.8 fL (ref 78.0–100.0)
PLATELETS: 304 10*3/uL (ref 150–400)
RBC: 4.21 MIL/uL (ref 3.87–5.11)
RDW: 16.1 % — ABNORMAL HIGH (ref 11.5–15.5)
WBC: 9.3 10*3/uL (ref 4.0–10.5)

## 2017-09-22 MED ORDER — WARFARIN SODIUM 5 MG PO TABS
5.0000 mg | ORAL_TABLET | Freq: Once | ORAL | Status: AC
  Administered 2017-09-22: 5 mg via ORAL
  Filled 2017-09-22: qty 1

## 2017-09-22 MED ORDER — PROMETHAZINE HCL 25 MG/ML IJ SOLN
6.2500 mg | Freq: Four times a day (QID) | INTRAMUSCULAR | Status: DC | PRN
  Administered 2017-09-22 – 2017-09-24 (×5): 6.25 mg via INTRAVENOUS
  Filled 2017-09-22 (×5): qty 1

## 2017-09-22 MED ORDER — POLYETHYLENE GLYCOL 3350 17 G PO PACK: 17.0000 g | PACK | Freq: Two times a day (BID) | ORAL | Status: DC

## 2017-09-22 MED ORDER — SENNOSIDES-DOCUSATE SODIUM 8.6-50 MG PO TABS
2.0000 | ORAL_TABLET | Freq: Two times a day (BID) | ORAL | Status: DC
  Administered 2017-09-22: 1 via ORAL
  Administered 2017-09-22: 2 via ORAL
  Filled 2017-09-22 (×3): qty 2

## 2017-09-22 NOTE — Social Work (Signed)
CSW awaits [employer] authorization. Pt will need authorization prior to discharging to SNF.   [place], [place] Work [phone]

## 2017-09-22 NOTE — Progress Notes (Signed)
[place] for Warfarin Indication: LV thrombus  Allergies  Allergen Reactions  . Codeine Anaphylaxis  . Tramadol Anaphylaxis    No hydrocodone or anything connected No hydrocodone or anything connected   . Clarithromycin Nausea And Vomiting  . Citalopram Other (See Comments)    Caused more depression   . Fish Allergy Itching    ears  . Other     Patient says that all narcotics give her anaphylaxis.   . Wellbutrin [Bupropion] Other (See Comments)    "could not speak a full sentence for a whole year"    Patient Measurements: Height: 5\' 7"  (170.2 cm) Weight: 139 lb 12.4 oz (63.4 kg) IBW/kg (Calculated) : 61.6   Vital Signs: Temp: 97.6 F (36.4 C) (03/30 0830) Temp Source: Oral (03/30 0830) BP: 143/73 (03/30 0830) Pulse Rate: 62 (03/30 0830)  Labs: Recent Labs    09/19/17 1836 09/20/17 0319 09/20/17 0906 09/21/17 0250 09/21/17 1037 09/22/17 0251  HGB  --   --   --  9.4*  --  10.0*  HCT  --   --   --  31.8*  --  33.6*  PLT  --   --   --  283  --  304  LABPROT  --   --  31.4* 25.9*  --  25.1*  INR  --   --  3.07 2.39  --  2.30  HEPARINUNFRC 0.43 0.33  --   --   --   --   CREATININE  --  1.83*  --   --  1.64*  --     Estimated Creatinine Clearance: 34.1 mL/min (A) (by C-G formula based on SCr of 1.64 mg/dL (H)).   Assessment: 62 year old female continues on warfarin for LV thrombus. Last dose of sq Lovenox at 12:30 pm 3/29  INR therapeutic today at 2.3.  H/H low but stable, plts wnl, and no bleeding noted.  Goal of Therapy:  Monitor platelets by anticoagulation protocol: Yes  INR = 2 to 3   Plan:  Repeat warfarin 5mg  x 1 tonight Monitor daily INR, CBC, s/s bleeding  Bertis Ruddy, PharmD Pharmacy Resident Pager #: [phone] 09/22/2017 10:45 AM

## 2017-09-22 NOTE — Progress Notes (Signed)
PROGRESS NOTE  Glenda Johnson [id number]:[id number] DOB: 1956-05-23 DOA: 09/15/2017 PCP: Paris Lore, MD   LOS: 7 days   Brief Narrative / Interim history: 62 year old female with COPD, paroxysmal A. 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  COPD also contributing -She was initially diuresed, however her creatinine reveals and her Lasix and Aldactone was stopped -Currently she appears euvolemic, on room air, maintaining good oxygen saturations -Cardiology followed and they have signed off on 3/29, d/w Dr. 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  They think it is most likely a thrombus given the fact that she had a negative echo a few months ago and tumor would be unlikely to grow this fast -Currently her INR is therapeutic, continue Coumadin per pharmacy  CVA/embolic stroke -Probably in the setting of LV thrombus, underlying A. fib not on anticoagulation prior to  admission.  MRI with multiple acute infarcts bilaterally.  She also has had some central vision loss with the stroke -Currently on Coumadin, continue per pharmacy -Carotid Doppler with minimal stenosis -Lower extremity Doppler negative for DVT -Neurology signed off 3/29, discussed with Dr. Erlinda Hong  Coronary artery disease status post and STEMI/versus demand ischemia -Troponin increased into the 9 range, now trending down -No further testing at this point per cardiology  COPD/tobacco abuse -No wheezing, currently stable on room air, continue duo nebs  Acute kidney injury on chronic kidney disease stage III -Baseline creatinine around 1.5, creatinine peaked at 2.2 with diuresis, now stable at 1.6 -Continue to monitor BMP  History of hepatitis C -Finished treatment, her gastroenterologist is at [facility] facility placement is pending, pending insurance authorization   DVT prophylaxis: Coumadin Code Status: Full code Family Communication: No family at bedside Disposition Plan: Awaiting insurance authorization for SNF placement, likely on Monday  Consultants:   Cardiology  Neurology  Cardiothoracic surgery  Procedures:   2D echo 3/28 Impressions: - Limited study with Definity echocontrast utilizing high mechanical index to evaluate perfusion within the left ventricular mass. There appears to be mild perfusion within the mass suggestive of a myxoma or a papillary fibroelastoma rather than a thrombus that would be completely avascular. This excludes highly vascular mass such as metastatic cancer. On this study we can also see a relatively thin stalk that attaches the mass to the left ventricular apex.    2D echo 3/27 Impressions: - Limited echo to assess LV mass. See description above. Given relatively normal wall motion at apex, I think this is more likely a cardiac tumor than a  thrombus. ?Myxoma versus other.    2D echo 3/24 Study Conclusions -  Left ventricle: The cavity size was normal. There was mild concentric hypertrophy. Systolic function was normal. The estimated ejection fraction was in the range of 55% to 60%. Wall motion was normal; there were no regional wall motion abnormalities. Features are consistent with a pseudonormal left ventricular filling pattern, with concomitant abnormal relaxation and increased filling pressure (grade 2 diastolic dysfunction). There was a large, 2.5 cm (L) x 1.8 cm (W), pedunculated, heterogeneous mobile mass. It has the appearance of a myxoma, but the location is atypical. Apical wall motion is normal in the surrounding myocardium, which makes a thrombus unlikely. - Left atrium: The atrium was mildly dilated. - Pulmonary arteries: Systolic pressure was mildly increased. PA peak pressure: 44 mm Hg (S). - Pericardium, extracardiac: A trivial pericardial effusion was identified.  Antimicrobials:  None    Subjective: - no chest pain, shortness of breath, no abdominal pain, nausea or vomiting.  Continues to complain of vision loss  Objective: Vitals:   09/21/17 1638 09/22/17 0037 09/22/17 0349 09/22/17 0830  BP: (!) 123/55 (!) 141/79  (!) 143/73  Pulse:  66  62  Resp: 18 20  20   Temp: (!) 97.5 F (36.4 C) 98.1 F (36.7 C)  97.6 F (36.4 C)  TempSrc: Oral Oral  Oral  SpO2:  96%  96%  Weight:   63.4 kg (139 lb 12.4 oz)   Height:        Intake/Output Summary (Last 24 hours) at 09/22/2017 1013 Last data filed at 09/21/2017 1716 Gross per 24 hour  Intake 462 ml  Output -  Net 462 ml   Filed Weights   09/19/17 0450 09/20/17 0300 09/22/17 0349  Weight: 61 kg (134 lb 7.7 oz) 61.2 kg (134 lb 14.7 oz) 63.4 kg (139 lb 12.4 oz)    Examination:  Constitutional: NAD Eyes: PERRL, lids and conjunctivae normal Respiratory: clear to auscultation bilaterally, no wheezing, no crackles. Cardiovascular: Regular rate and rhythm, no murmurs / rubs / gallops. Abdomen: no tenderness. Bowel sounds  positive.  Musculoskeletal: no clubbing / cyanosis. No joint deformity upper and lower extremities. No contractures. Normal muscle tone.  Skin: no rashes Neurologic: non focal, equal strength  Psychiatric: Normal judgment and insight. Alert and oriented x 3. Normal mood.    Data Reviewed: I have independently reviewed following labs and imaging studies   CBC: Recent Labs  Lab 09/15/17 2005 09/16/17 0445 09/17/17 0237 09/18/17 0238 09/19/17 0314 09/21/17 0250 09/22/17 0251  WBC 12.3* 9.1 12.3* 12.1* 9.3 8.7 9.3  NEUTROABS 9.1* 6.0  --   --   --   --   --   HGB 11.2* 9.2* 10.1* 10.5* 9.5* 9.4* 10.0*  HCT 38.4 31.0* 33.7* 35.4* 32.2* 31.8* 33.6*  MCV 81.9 79.7 79.3 78.7 78.3 79.1 79.8  PLT 166 126* 171 222 247 283 299   Basic Metabolic Panel: Recent Labs  Lab 09/16/17 0128 09/16/17 0445 09/17/17 0237 09/18/17 0238 09/19/17 0913 09/20/17 0319 09/21/17 1037  NA  --  133* 129* 127* 129* 129* 130*  K  --  3.0* 4.6 5.1 4.4 4.0 3.7  CL  --  96* 94* 90* 94* 97* 100*  CO2  --  22 22 20* 23 23 20*  GLUCOSE  --  145* 123* 114* 94 114* 121*  BUN  --  29* 35* 48* 49* 43* 29*  CREATININE  --  1.56* 1.85* 2.22* 1.93* 1.83* 1.64*  CALCIUM  --  7.8* 8.4* 8.7* 8.5* 7.9* 8.2*  MG 2.0 1.8 2.1  --   --   --   --   PHOS 3.1 2.9 2.8  --   --   --   --    GFR: Estimated Creatinine Clearance: 34.1 mL/min (A) (by C-G formula based on SCr of 1.64 mg/dL (H)). Liver Function Tests: Recent Labs  Lab 09/15/17 2005 09/16/17 0445 09/17/17 0237 09/21/17 0250  AST 116* 92* 65* 23  ALT 166* 123* 96* 44  ALKPHOS 106 77 84 65  BILITOT 1.9* 2.1* 1.9* 1.3*  PROT 6.9 5.5* 5.9* 5.6*  ALBUMIN 3.4* 2.7* 3.0* 2.7*   Recent Labs  Lab 09/16/17 0128  LIPASE 34   No results for input(s): AMMONIA in the last 168 hours. Coagulation Profile: Recent Labs  Lab 09/19/17 0314 09/20/17 0906 09/21/17 0250 09/22/17 0251  INR 1.66 3.07 2.39 2.30   Cardiac Enzymes: Recent Labs  Lab 09/16/17 0128  09/16/17 0445 09/16/17 1036 09/19/17 0913  TROPONINI 8.63* 9.29* 8.99* 3.29*   BNP (last 3 results) No results for input(s): PROBNP in the last 8760 hours. HbA1C: Recent Labs    09/19/17 1310  HGBA1C 5.3   CBG: No results for input(s): GLUCAP in the last 168 hours. Lipid Profile: Recent Labs    09/19/17 1310  CHOL 63  HDL 11*  LDLCALC 41  TRIG 57  CHOLHDL 5.7   Thyroid Function Tests: No results for input(s): TSH, T4TOTAL, FREET4, T3FREE, THYROIDAB in the last 72 hours. Anemia Panel: No results for input(s): VITAMINB12, FOLATE, FERRITIN, TIBC, IRON, RETICCTPCT in the last 72 hours. Urine analysis:    Component Value Date/Time   COLORURINE YELLOW 09/16/2017 0040   APPEARANCEUR CLEAR 09/16/2017 0040   LABSPEC 1.009 09/16/2017 0040   PHURINE 6.0 09/16/2017 0040   GLUCOSEU NEGATIVE 09/16/2017 0040   HGBUR SMALL (A) 09/16/2017 0040   BILIRUBINUR NEGATIVE 09/16/2017 0040   KETONESUR NEGATIVE 09/16/2017 0040   PROTEINUR 100 (A) 09/16/2017 0040   NITRITE NEGATIVE 09/16/2017 0040   LEUKOCYTESUR NEGATIVE 09/16/2017 0040   Sepsis Labs: Invalid input(s): PROCALCITONIN, LACTICIDVEN  Recent Results (from the past 240 hour(s))  Blood Culture (routine x 2)     Status: None   Collection Time: 09/15/17  7:50 PM  Result Value Ref Range Status   Specimen Description BLOOD BLOOD LEFT FOREARM  Final   Special Requests   Final    BOTTLES DRAWN AEROBIC AND ANAEROBIC Blood Culture adequate volume   Culture   Final    NO GROWTH 5 DAYS Performed at [facility], [address] [place], [place] [address]    Report Status 09/20/2017 FINAL  Final  Blood Culture (routine x 2)     Status: None   Collection Time: 09/15/17  8:00 PM  Result Value Ref Range Status   Specimen Description BLOOD BLOOD RIGHT HAND  Final   Special Requests   Final    BOTTLES DRAWN AEROBIC AND ANAEROBIC Blood Culture adequate volume   Culture   Final    NO GROWTH 5 DAYS Performed at [facility], [place] [address], [place], [place] [address]    Report Status 09/20/2017 FINAL  Final  MRSA PCR Screening     Status: None   Collection Time: 09/16/17 12:36 AM  Result Value Ref Range Status   MRSA by PCR NEGATIVE NEGATIVE Final    Comment:        The GeneXpert MRSA Assay (FDA approved for NASAL specimens  only), is one component of a comprehensive MRSA colonization surveillance program. It is not intended to diagnose MRSA infection nor to guide or monitor treatment for MRSA infections. Performed at [facility], [place] [address] [place], [place] [address]       Radiology Studies: Ct Chest Without Contrast  Result Date: 09/20/2017 CLINICAL DATA:  62 year old female with shortness of breath. History of atrial fibrillation. EXAM: CT CHEST WITHOUT CONTRAST TECHNIQUE: Multidetector CT imaging of the chest was performed following the standard protocol without IV contrast. COMPARISON:  No priors. FINDINGS: Cardiovascular: Heart size is borderline enlarged. Small amount of pericardial fluid and/or thickening, unlikely to be of hemodynamic significance at this time, most evident underlying the atrioventricular groove and in superior pericardial recesses. No associated pericardial calcification. Aortic atherosclerosis. No definite coronary artery calcifications are identified. Mediastinum/Nodes: Multiple prominent borderline enlarged mediastinal and hilar lymph nodes are noted, nonspecific. Esophagus is unremarkable in appearance. No axillary lymphadenopathy. Lungs/Pleura: Small right and trace left pleural effusions lying dependently. Areas of dependent subsegmental atelectasis in the right lower lobe. Patchy areas of predominantly ground-glass attenuation consolidation in the lung bases bilaterally, with somewhat of a peripheral mass-like appearance in the left lower lobe where there is more profound peripheral density with central areas of ground-glass attenuation and septal thickening,  best appreciated on axial image 125 of series 7 and sagittal image 76 of series 6 where the largest lesion measures 3.7 x 2.2 x 3.8 cm. Diffuse bronchial wall thickening with mild to moderate centrilobular and paraseptal emphysema. Upper Abdomen: Trace amount of perihepatic ascites. Soft tissue stranding throughout the visualized retroperitoneum. Fluid attenuation in the region of the porta hepatis, suggesting edema. Musculoskeletal: Densely calcified bilateral breast implants. There are no aggressive appearing lytic or blastic lesions noted in the visualized portions of the skeleton. IMPRESSION: 1. Multifocal opacities in the lung bases bilaterally which have an unusual appearance. These are favored to reflect potential areas of cryptogenic organizing pneumonia (COP) based on the appearance of the largest lesion in the posterior left lower lobe which has a suggestion of the so-called "atoll sign". However, the overall appearance is nonspecific. Differential considerations include atypical infectious etiologies, areas of alveolar hemorrhage in the setting of multifocal pulmonary infarction, and even neoplasm such as adenocarcinoma. At this time, repeat noncontrast chest CT is suggested in 3-6 weeks to re-evaluate these findings. If there is any clinical concern for acute pulmonary embolus at this time, further evaluation with PE protocol CT would be recommended. 2. Small right and trace left pleural effusions lying dependently. 3. Small amount of pericardial fluid and/or thickening, unlikely to be of hemodynamic significance at this time. 4. Mild diffuse bronchial wall thickening and mild to moderate centrilobular and paraseptal emphysema; imaging findings compatible with the clinical history of COPD. 5. Aortic atherosclerosis. These results were called by telephone at the time of interpretation on 09/20/2017 at 5:42 pm to Dr. Ivin Poot, who verbally acknowledged these results. Aortic Atherosclerosis  (ICD10-I70.0) and Emphysema (ICD10-J43.9). Electronically Signed   By: Vinnie Langton M.D.   On: 09/20/2017 17:43     Scheduled Meds: . aspirin EC  81 mg Oral Daily  . carvedilol  12.5 mg Oral BID WC  . feeding supplement  1 Container Oral TID BM  . gi cocktail  30 mL Oral Once  . mouth rinse  15 mL Mouth Rinse BID  . pantoprazole  40 mg Oral Q1200  . senna-docusate  2 tablet Oral BID  . Warfarin - Pharmacist  Dosing Inpatient   Does not apply q1800   Continuous Infusions: . sodium chloride    . sodium chloride 75 mL/hr at 09/21/17 0920  . sodium chloride       Marzetta Board, MD, PhD [facility] Pager [phone]  If 7PM-7AM, please contact night-coverage [email or web address] Password [email or web address] 09/22/2017, 10:13 AM

## 2017-09-23 ENCOUNTER — Inpatient Hospital Stay (HOSPITAL_COMMUNITY): Payer: BLUE CROSS/BLUE SHIELD

## 2017-09-23 MED ORDER — FAMOTIDINE 20 MG PO TABS
20.0000 mg | ORAL_TABLET | Freq: Once | ORAL | Status: AC
  Administered 2017-09-23: 20 mg via ORAL
  Filled 2017-09-23: qty 1

## 2017-09-23 MED ORDER — WARFARIN SODIUM 5 MG PO TABS
5.0000 mg | ORAL_TABLET | Freq: Once | ORAL | Status: AC
  Administered 2017-09-23: 5 mg via ORAL
  Filled 2017-09-23: qty 1

## 2017-09-23 MED ORDER — CALCIUM CARBONATE ANTACID 500 MG PO CHEW
1.0000 | CHEWABLE_TABLET | Freq: Two times a day (BID) | ORAL | Status: DC
  Administered 2017-09-23 – 2017-09-24 (×3): 200 mg via ORAL
  Filled 2017-09-23 (×4): qty 1

## 2017-09-23 NOTE — Progress Notes (Signed)
Patient complained of 9/10 abdominal pain. MD notified. Received new orders. Will continue to monitor.

## 2017-09-23 NOTE — Progress Notes (Signed)
PROGRESS NOTE  Glenda Johnson MPN:[id number] DOB: 02-02-1956 DOA: 09/15/2017 PCP: Paris Lore, MD   LOS: 8 days   Brief Narrative / Interim history: 62 year old female with COPD, paroxysmal A. 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  COPD also contributing - She was initially diuresed, however her creatinine was elevated and her Lasix and Aldactone was stopped - Cardiology followed and they have signed off on 3/29, d/w Dr. Acie Fredrickson.   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  They think it is most likely a thrombus given the fact that she had a negative echo a few months ago and tumor would be unlikely to grow this fast - Currently her INR is therapeutic, continue Coumadin per pharmacy  CVA/embolic stroke - Probably in the setting of LV thrombus, underlying A. fib not on anticoagulation prior to admission.  MRI with multiple acute infarcts bilaterally.  She also has had  some central vision loss with the stroke - Continue on Coumadin, continue per pharmacy - Carotid Doppler with minimal stenosis - Lower extremity Doppler negative for DVT - Neurology signed off 3/29, discussed with Dr. Erlinda Hong  Coronary artery disease status post and STEMI/versus demand ischemia -Troponin increased into the 9 range, now trending down -No further testing at this point per cardiology  COPD/tobacco abuse -No wheezing, currently stable on room air, continue duo nebs  Acute kidney injury on chronic kidney disease stage III -Baseline creatinine around 1.5, creatinine peaked at 2.2 with diuresis, now stable at 1.6 -Continue to monitor BMP  History of hepatitis C -Finished treatment, her gastroenterologist is at [facility] facility placement is pending, pending insurance authorization  DVT prophylaxis: Coumadin Code Status: Full code Family Communication: No family at bedside Disposition Plan: Awaiting insurance authorization for SNF placement, most likely Monday  Consultants:   Cardiology  Neurology  Cardiothoracic surgery  Procedures:   2D echo 3/28 Impressions: - Limited study with Definity echocontrast utilizing high mechanical index to evaluate perfusion within the left ventricular mass. There appears to be mild perfusion within the mass suggestive of a myxoma or a papillary fibroelastoma rather than a thrombus that would be completely avascular. This excludes highly vascular mass such as metastatic cancer. On this study we can also see a relatively thin stalk that attaches the mass to the left ventricular apex.    2D echo 3/27 Impressions: - Limited echo to assess LV mass. See description above. Given relatively normal wall motion at apex, I think this is more likely a cardiac tumor than a  thrombus. ?Myxoma versus other.    2D echo 3/24 Study Conclusions - Left ventricle: The cavity size was normal. There was mild concentric  hypertrophy. Systolic function was normal. The estimated ejection fraction was in the range of 55% to 60%. Wall motion was normal; there were no regional wall motion abnormalities. Features are consistent with a pseudonormal left ventricular filling pattern, with concomitant abnormal relaxation and increased filling pressure (grade 2 diastolic dysfunction). There was a large, 2.5 cm (L) x 1.8 cm (W), pedunculated, heterogeneous mobile mass. It has the appearance of a myxoma, but the location is atypical. Apical wall motion is normal in the surrounding myocardium, which makes a thrombus unlikely. - Left atrium: The atrium was mildly dilated. - Pulmonary arteries: Systolic pressure was mildly increased. PA peak pressure: 44 mm Hg (S). - Pericardium, extracardiac: A trivial pericardial effusion was identified.  Antimicrobials:  None    Subjective: No complaints reported to me.   Objective: Vitals:   09/22/17 1739 09/22/17 2228 09/23/17 0040 09/23/17 0754  BP:   140/68 (!) 147/66  Pulse:   62 66  Resp:      Temp:   98.6 F (37 C) 98.1 F (36.7 C)  TempSrc:   Oral Oral  SpO2: 93% 97% 98% 93%  Weight:      Height:        Intake/Output Summary (Last 24 hours) at 09/23/2017 1353 Last data filed at 09/22/2017 1700 Gross per 24 hour  Intake 444 ml  Output -  Net 444 ml   Filed Weights   09/19/17 0450 09/20/17 0300 09/22/17 0349  Weight: 61 kg (134 lb 7.7 oz) 61.2 kg (134 lb 14.7 oz) 63.4 kg (139 lb 12.4 oz)    Examination:  Constitutional: NAD, alert and awake Eyes: PERRL, lids and conjunctivae normal Respiratory: clear to auscultation bilaterally, no wheezing, no crackles. Equal chest rise.  Cardiovascular: Regular rate and rhythm, no murmurs / rubs / gallops. Abdomen: no tenderness. Bowel sounds positive.  Musculoskeletal: no clubbing / cyanosis. No joint deformity upper and lower extremities. No contractures. Normal muscle tone.  Skin: no rashes Neurologic: non focal, equal  strength , no facial asymmetry Psychiatric: Normal judgment and insight. Alert and oriented x 3. Normal mood.    Data Reviewed: I have independently reviewed following labs and imaging studies   CBC: Recent Labs  Lab 09/17/17 0237 09/18/17 0238 09/19/17 0314 09/21/17 0250 09/22/17 0251  WBC 12.3* 12.1* 9.3 8.7 9.3  HGB 10.1* 10.5* 9.5* 9.4* 10.0*  HCT 33.7* 35.4* 32.2* 31.8* 33.6*  MCV 79.3 78.7 78.3 79.1 79.8  PLT 171 222 247 283 811   Basic Metabolic Panel: Recent Labs  Lab 09/17/17 0237 09/18/17 0238 09/19/17 0913 09/20/17 0319 09/21/17 1037  NA 129* 127* 129* 129* 130*  K 4.6 5.1 4.4 4.0 3.7  CL 94* 90* 94* 97* 100*  CO2 22 20* 23 23 20*  GLUCOSE 123* 114* 94 114* 121*  BUN 35* 48* 49* 43* 29*  CREATININE 1.85* 2.22* 1.93* 1.83* 1.64*  CALCIUM 8.4* 8.7* 8.5* 7.9* 8.2*  MG 2.1  --   --   --   --   PHOS 2.8  --   --   --   --    GFR: Estimated Creatinine Clearance: 34.1 mL/min (A) (by C-G formula based on SCr of 1.64 mg/dL (H)). Liver Function Tests: Recent Labs  Lab 09/17/17 0237 09/21/17 0250  AST 65* 23  ALT 96* 44  ALKPHOS 84 65  BILITOT 1.9*  1.3*  PROT 5.9* 5.6*  ALBUMIN 3.0* 2.7*   No results for input(s): LIPASE, AMYLASE in the last 168 hours. No results for input(s): AMMONIA in the last 168 hours. Coagulation Profile: Recent Labs  Lab 09/19/17 0314 09/20/17 0906 09/21/17 0250 09/22/17 0251  INR 1.66 3.07 2.39 2.30   Cardiac Enzymes: Recent Labs  Lab 09/19/17 0913  TROPONINI 3.29*   BNP (last 3 results) No results for input(s): PROBNP in the last 8760 hours. HbA1C: No results for input(s): HGBA1C in the last 72 hours. CBG: No results for input(s): GLUCAP in the last 168 hours. Lipid Profile: No results for input(s): CHOL, HDL, LDLCALC, TRIG, CHOLHDL, LDLDIRECT in the last 72 hours. Thyroid Function Tests: No results for input(s): TSH, T4TOTAL, FREET4, T3FREE, THYROIDAB in the last 72 hours. Anemia Panel: No results for  input(s): VITAMINB12, FOLATE, FERRITIN, TIBC, IRON, RETICCTPCT in the last 72 hours. Urine analysis:    Component Value Date/Time   COLORURINE YELLOW 09/16/2017 0040   APPEARANCEUR CLEAR 09/16/2017 0040   LABSPEC 1.009 09/16/2017 0040   PHURINE 6.0 09/16/2017 0040   GLUCOSEU NEGATIVE 09/16/2017 0040   HGBUR SMALL (A) 09/16/2017 0040   BILIRUBINUR NEGATIVE 09/16/2017 0040   KETONESUR NEGATIVE 09/16/2017 0040   PROTEINUR 100 (A) 09/16/2017 0040   NITRITE NEGATIVE 09/16/2017 0040   LEUKOCYTESUR NEGATIVE 09/16/2017 0040   Sepsis Labs: Invalid input(s): PROCALCITONIN, LACTICIDVEN  Recent Results (from the past 240 hour(s))  Blood Culture (routine x 2)     Status: None   Collection Time: 09/15/17  7:50 PM  Result Value Ref Range Status   Specimen Description BLOOD BLOOD LEFT FOREARM  Final   Special Requests   Final    BOTTLES DRAWN AEROBIC AND ANAEROBIC Blood Culture adequate volume   Culture   Final    NO GROWTH 5 DAYS Performed at [facility], [address], [place], [place] [address]    Report Status 09/20/2017 FINAL  Final  Blood Culture (routine x 2)     Status: None   Collection Time: 09/15/17  8:00 PM  Result Value Ref Range Status   Specimen Description BLOOD BLOOD RIGHT HAND  Final   Special Requests   Final    BOTTLES DRAWN AEROBIC AND ANAEROBIC Blood Culture adequate volume   Culture   Final    NO GROWTH 5 DAYS Performed at [facility], [place] [address], [place], [place] [address]    Report Status 09/20/2017 FINAL  Final  MRSA PCR Screening     Status: None   Collection Time: 09/16/17 12:36 AM  Result Value Ref Range Status   MRSA by PCR NEGATIVE NEGATIVE Final    Comment:        The GeneXpert MRSA Assay (FDA approved for NASAL specimens only), is one component of a comprehensive MRSA colonization surveillance program. It is not intended to diagnose MRSA infection nor to guide or monitor treatment for MRSA infections. Performed at [facility], [place] [address], [place],  [address]       Radiology Studies: No results found.   Scheduled Meds: . aspirin EC  81 mg Oral Daily  . carvedilol  12.5 mg Oral BID WC  . feeding supplement  1 Container Oral TID BM  . gi cocktail  30 mL Oral Once  . mouth rinse  15 mL Mouth Rinse BID  . pantoprazole  40 mg Oral Q1200  . senna-docusate  2 tablet Oral BID  . Warfarin -  Pharmacist Dosing Inpatient   Does not apply q1800   Continuous Infusions: . sodium chloride    . sodium chloride 75 mL/hr at 09/21/17 0920  . sodium chloride       Velvet Bathe, MD [facility] Pager [phone]  If 7PM-7AM, please contact night-coverage [email or web address] Password [email or web address] 09/23/2017, 1:53 PM

## 2017-09-23 NOTE — Progress Notes (Signed)
[place] for Warfarin Indication: LV thrombus  Allergies  Allergen Reactions  . Codeine Anaphylaxis  . Tramadol Anaphylaxis    No hydrocodone or anything connected No hydrocodone or anything connected   . Clarithromycin Nausea And Vomiting  . Citalopram Other (See Comments)    Caused more depression   . Fish Allergy Itching    ears  . Other     Patient says that all narcotics give her anaphylaxis.   . Wellbutrin [Bupropion] Other (See Comments)    "could not speak a full sentence for a whole year"    Patient Measurements: Height: 5\' 7"  (170.2 cm) Weight: 139 lb 12.4 oz (63.4 kg) IBW/kg (Calculated) : 61.6   Vital Signs: Temp: 98.1 F (36.7 C) (03/31 0754) Temp Source: Oral (03/31 0754) BP: 147/66 (03/31 0754) Pulse Rate: 66 (03/31 0754)  Labs: Recent Labs    09/21/17 0250 09/21/17 1037 09/22/17 0251  HGB 9.4*  --  10.0*  HCT 31.8*  --  33.6*  PLT 283  --  304  LABPROT 25.9*  --  25.1*  INR 2.39  --  2.30  CREATININE  --  1.64*  --     Estimated Creatinine Clearance: 34.1 mL/min (A) (by C-G formula based on SCr of 1.64 mg/dL (H)).   Assessment: 62 year old female continues on warfarin for LV thrombus. Last dose of sq Lovenox at 12:30 pm 3/29  Pt refused AM labs, spoke with RN who communicated that pt would be agreeable to lab draw later this afternoon and phlebotomy was contacted to do so.  INR has been therapeutic around 2.3 the last two days.  CBC stable and no bleeding reported.    Goal of Therapy:  Monitor platelets by anticoagulation protocol: Yes  INR = 2 to 3   Plan:  Will repeat 5mg  dose tonight  F/u INR communicated to 2nd shift Monitor daily INR, CBC, s/s bleeding  Bertis Ruddy, PharmD Pharmacy Resident Pager #: [phone] 09/23/2017 3:24 PM

## 2017-09-23 NOTE — Progress Notes (Signed)
Transferred patient from bed to chair. Put chair alarm on. Instructed patient to call for help before getting up. Ten minutes later, chair alarm went off. Found patient walking by self to bathroom. Asked patient if she called for help. Patient stated, "No, nobody would get here fast enough. Asked nurse tech to take patient to bathroom and back.

## 2017-09-23 NOTE — Progress Notes (Signed)
Patient has complained of epigastric pain post eating. Patient requesting further evaluation from MDs. Wrote sticky note on chart. At this time, patient is not complaining of pain.

## 2017-09-23 NOTE — Progress Notes (Signed)
Found patient hunched over bedside table with head down and legs dangling over edge of bed. Instructed patient that by doing this, she could fall. Patient then complains that her tv remote was not working. After working with the remote, found no issues with it. Patient requested someone to look at it. Instructed patient that there were no issues with the tv remote.

## 2017-09-24 MED ORDER — WARFARIN SODIUM 5 MG PO TABS
5.0000 mg | ORAL_TABLET | Freq: Once | ORAL | Status: AC
  Administered 2017-09-24: 5 mg via ORAL
  Filled 2017-09-24: qty 1

## 2017-09-24 MED ORDER — PANTOPRAZOLE SODIUM 40 MG PO TBEC: 40.0000 mg | DELAYED_RELEASE_TABLET | Freq: Every day | ORAL | Status: AC

## 2017-09-24 MED ORDER — CARVEDILOL 12.5 MG PO TABS: 12.5000 mg | ORAL_TABLET | Freq: Two times a day (BID) | ORAL | Status: AC

## 2017-09-24 MED ORDER — WARFARIN SODIUM 5 MG PO TABS: 5.0000 mg | ORAL_TABLET | Freq: Every day | ORAL | 0 refills | Status: DC

## 2017-09-24 NOTE — Progress Notes (Signed)
Physical Therapy Treatment Patient Details Name: Glenda Johnson MRN: [id number] DOB: [birth year] Today's Date: 09/24/2017    History of Present Illness Pt is a 62 y/o female with PMH of COPD, atrial fibrillation (non compliant with anticoagulation), HTN, Hep C, and diastolic heart failure admitted on 3/23 with acute respiratory failure due to pulmonary edema and hypertensive emergency. She was admitted to the ICU on BiPAP and improved with IV diuretics. Echocardiogram was performed on 3/24 and noted a 2.5 x 1.8 cm mobile mass in the left ventricle. 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 The following day, patient complained of worsening bilateral blurry vision. MRI brain today revealed multiple acute infarcts bilaterally consistent with cerebral emboli.     PT Comments    Pt admitted with above diagnosis. Pt currently with functional limitations due to balance and endurance deficits. Pt was able to ambulate but with unsteady gait.  Pt scored 16/24 on DGI suggesting at risk for falls.  Pt ready for SNF.   Pt will benefit from skilled PT to increase their independence and safety with mobility to allow discharge to the venue listed below.     Follow Up Recommendations  SNF;Supervision/Assistance - 24 hour     Equipment Recommendations  None recommended by PT    Recommendations for Other Services       Precautions / Restrictions Precautions Precautions: Fall Restrictions Weight Bearing Restrictions: No    Mobility  Bed Mobility               General bed mobility comments: in chair eating lunch on arival.   Transfers Overall transfer level: Needs assistance Equipment used: None;1 person hand held assist Transfers: Sit to/from Stand Sit to Stand: Min guard         General transfer comment: cues for hand placement  Ambulation/Gait Ambulation/Gait assistance: Min  guard;Min assist Ambulation Distance (Feet): 200 Feet Assistive device: None;1 person hand held assist Gait Pattern/deviations: Decreased stride length;Step-through pattern;Drifts right/left;Staggering left   Gait velocity interpretation: Below normal speed for age/gender General Gait Details: Pt reports her vision is a little better and she can see a little better.  Pt unsteady with challenges to balance. see balance test.  Needed single UE support at times.    Stairs            Wheelchair Mobility    Modified Rankin (Stroke Patients Only) Modified Rankin (Stroke Patients Only) Pre-Morbid Rankin Score: Slight disability Modified Rankin: Moderately severe disability     Balance Overall balance assessment: Needs assistance Sitting-balance support: Feet supported Sitting balance-Leahy Scale: Fair     Standing balance support: No upper extremity supported;Single extremity supported;During functional activity Standing balance-Leahy Scale: Fair Standing balance comment: can release UE support  in static standing                 Standardized Balance Assessment Standardized Balance Assessment : Dynamic Gait Index   Dynamic Gait Index Level Surface: Normal Change in Gait Speed: Mild Impairment Gait with Horizontal Head Turns: Mild Impairment Gait with Vertical Head Turns: Mild Impairment Gait and Pivot Turn: Mild Impairment Step Over Obstacle: Mild Impairment Step Around Obstacles: Mild Impairment Steps: Moderate Impairment Total Score: 16      Cognition Arousal/Alertness: Awake/alert Behavior During Therapy: WFL for tasks assessed/performed Overall Cognitive Status: No family/caregiver present to determine baseline cognitive functioning Area of Impairment: Following commands;Safety/judgement;Problem solving;Attention;Memory  Current Attention Level: Sustained Memory: Decreased short-term memory Following Commands: Follows one step  commands with increased time;Follows multi-step commands inconsistently Safety/Judgement: Decreased awareness of safety;Decreased awareness of deficits   Problem Solving: Slow processing;Decreased initiation;Difficulty sequencing;Requires verbal cues        Exercises      General Comments General comments (skin integrity, edema, etc.): Scored 16/24 on DGI suggesting at risk for falls without device or assist.  Also pt with DOE 3/4 at end of walk.  Sats >96%.       Pertinent Vitals/Pain Pain Assessment: No/denies pain    Home Living                      Prior Function            PT Goals (current goals can now be found in the care plan section) Progress towards PT goals: Progressing toward goals    Frequency    Min 3X/week      PT Plan Current plan remains appropriate    Co-evaluation              AM-PAC PT "6 Clicks" Daily Activity  Outcome Measure  Difficulty turning over in bed (including adjusting bedclothes, sheets and blankets)?: None Difficulty moving from lying on back to sitting on the side of the bed? : None Difficulty sitting down on and standing up from a chair with arms (e.g., wheelchair, bedside commode, etc,.)?: A Little Help needed moving to and from a bed to chair (including a wheelchair)?: A Little Help needed walking in hospital room?: A Little Help needed climbing 3-5 steps with a railing? : A Lot 6 Click Score: 19    End of Session Equipment Utilized During Treatment: Gait belt Activity Tolerance: Patient limited by fatigue Patient left: in chair;with call bell/phone within reach Nurse Communication: Mobility status PT Visit Diagnosis: Other abnormalities of gait and mobility (R26.89)     Time: 8850-2774 PT Time Calculation (min) (ACUTE ONLY): 18 min  Charges:  $Gait Training: 8-22 mins                    G Codes:       [place] Saamir Armstrong,PT Acute Rehabilitation [phone] (pager)    Denice Paradise 09/24/2017, 12:59 PM

## 2017-09-24 NOTE — Progress Notes (Signed)
[place] for Warfarin Indication: LV thrombus  Allergies  Allergen Reactions  . Codeine Anaphylaxis  . Tramadol Anaphylaxis    No hydrocodone or anything connected No hydrocodone or anything connected   . Clarithromycin Nausea And Vomiting  . Citalopram Other (See Comments)    Caused more depression   . Fish Allergy Itching    ears  . Other     Patient says that all narcotics give her anaphylaxis.   . Wellbutrin [Bupropion] Other (See Comments)    "could not speak a full sentence for a whole year"    Patient Measurements: Height: 5\' 7"  (170.2 cm) Weight: 139 lb 12.4 oz (63.4 kg) IBW/kg (Calculated) : 61.6   Vital Signs: Temp: 97.3 F (36.3 C) (04/01 0800) Temp Source: Oral (04/01 0800) BP: 146/76 (04/01 0800) Pulse Rate: 77 (04/01 0800)  Labs: Recent Labs    09/21/17 1037 09/22/17 0251  HGB  --  10.0*  HCT  --  33.6*  PLT  --  304  LABPROT  --  25.1*  INR  --  2.30  CREATININE 1.64*  --     Estimated Creatinine Clearance: 34.1 mL/min (A) (by C-G formula based on SCr of 1.64 mg/dL (H)).  Assessment: 62 year old female continues on warfarin for  LV thrombus -held d/t rise in INR from higher dose on 3/27 than intended (8 mg instead of 3 mg) due to a standing order that was missed. INR now mostly stable at 2.3. Patient has been refusing INR checks, no INR today. Hgb 10, plts wnl.  Goal of Therapy:  Monitor platelets by anticoagulation protocol: Yes  INR = 2 to 3   Plan:  Give Coumadin 5mg  PO x 1 Monitor daily INR, CBC, s/s of bleed  Elenor Quinones, PharmD, [facility] Clinical Pharmacist Pager [phone] 09/24/2017 10:04 AM

## 2017-09-24 NOTE — Progress Notes (Signed)
Patient with frequent premature atrial contractions, had 12 beats of atrial tachycardia.  Patient is asymptomatic, resting during event.  Covering NP made aware.

## 2017-09-24 NOTE — Care Management Note (Signed)
Case Management Note  Patient Details  Name: Glenda Johnson MRN: [id number] Date of Birth: 1956/01/28  Subjective/Objective:   Acute respiratory failure with hypoxia, cerebral thrombosis with cerebral infarction, CHF, CVA                Action/Plan: NCM spoke to pt and states she lives at home alone. Her daughter is in [place]. The plan is to go live in [place] with daughter. CSW following for SNF placement.  Expected Discharge Date:  09/21/17               Expected Discharge Plan:  Skilled Nursing Facility  In-House Referral:  Clinical Social Work  Discharge planning Services  CM Consult  Post Acute Care Choice:  NA Choice offered to:  NA  DME Arranged:  N/A DME Agency:  NA  HH Arranged:  NA HH Agency:  NA  Status of Service:  Completed, signed off  If discussed at [employer] of Stay Meetings, dates discussed:    Additional Comments:  Erenest Rasher, RN 09/24/2017, 2:46 PM

## 2017-09-24 NOTE — Discharge Summary (Signed)
Physician Discharge Summary  [facility] WIO:[id number] DOB: September 04, 1955 DOA: 09/15/2017  PCP: Paris Lore, MD  Admit date: 09/15/2017 Discharge date: 09/24/2017  Time spent:  35 minutes  Recommendations for Outpatient Follow-up:  1. Please see below 2. Monitor INR levels and adjust coumadin levels appropriately   Discharge Diagnoses:  Principal Problem:   Acute respiratory failure with hypoxia (HCC) Active Problems:   Elevated troponin   Flash pulmonary edema (HCC)   Cerebral thrombosis with cerebral infarction   Congestive heart failure (HCC)   Cerebrovascular accident (CVA) due to embolism of cerebral artery [facility])   Cardiac mass   Anticoagulation adequate   Discharge Condition: stable  Diet recommendation: Heart healthy  Filed Weights   09/19/17 0450 09/20/17 0300 09/22/17 0349  Weight: 61 kg (134 lb 7.7 oz) 61.2 kg (134 lb 14.7 oz) 63.4 kg (139 lb 12.4 oz)    History of present illness:  62 year old female with COPD, paroxysmal A. fib, previously declined anticoagulation, chronic systolic and diastolic CHF, hep C who was admitted to the hospital with respiratory distress from pulmonary edema  Hospital Course:   Acute respiratory failure with hypoxia/acute on chronic diastolic CHF - On admission this was thought to be due to flash pulmonary edema and hypertensive urgency.  COPD also contributing - She was initially diuresed, however her creatinine was elevated and her Lasix and Aldactone was stopped - Cardiology followed and they have signed off on 3/29, d/w Dr. Acie Fredrickson.   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  They think it is most likely a thrombus given the fact that she had a negative echo a few months ago and tumor would be unlikely to grow this fast -  recommend monitoring INR levels and adjusting Coumadin levels  CVA/embolic stroke - Probably in the setting of LV thrombus, underlying A. fib not on anticoagulation prior to admission.  MRI with multiple acute infarcts bilaterally.  She also has had some central vision loss with the stroke - Continue on Coumadin, continue per pharmacy - Carotid Doppler with minimal stenosis - Lower extremity Doppler negative for DVT - Neurology signed off 3/29, discussed with Dr. Erlinda Hong  Coronary artery disease status post and STEMI/versus demand ischemia -Troponin increased into the 9 range, now trending down -No further testing at this point per cardiology  COPD/tobacco abuse -stable  Acute kidney injury on chronic kidney disease stage III -Baseline creatinine around 1.5, creatinine peaked at 2.2 with diuresis, now stable at 1.6 -Continue to monitor BMP at SNF  History of hepatitis C -Finished treatment, her gastroenterologist is at [facility]   Consultations:  Cardiology  Neurology  Discharge Exam: Vitals:   09/24/17 0000 09/24/17 0800  BP: 125/61 (!) 146/76  Pulse: 68 77  Resp:  20  Temp: 99.4 F (37.4 C) (!) 97.3 F (36.3 C)  SpO2: 95% 96%    General: Pt in nad, alert and awake Cardiovascular: rrr, no rubs Respiratory: no increased wob, no wheezes  Discharge Instructions   Discharge Instructions    Ambulatory referral to Neurology   Complete by:  As directed    Follow up with [facility] Erlinda [facility] at [facility] in about 4 weeks.   Call MD for:  extreme fatigue   Complete by:  As directed    Call MD for:  severe  uncontrolled pain   Complete by:  As directed    Call MD for:  temperature >100.4   Complete by:  As directed    Diet - low sodium heart healthy   Complete by:  As directed    Discharge instructions   Complete by:  As directed    Please follow-up with neurology and her cardiologist after hospital discharge.   Increase activity slowly   Complete by:  As directed      Allergies as  of 09/24/2017      Reactions   Codeine Anaphylaxis   Tramadol Anaphylaxis   No hydrocodone or anything connected No hydrocodone or anything connected   Clarithromycin Nausea And Vomiting   Citalopram Other (See Comments)   Caused more depression    Fish Allergy Itching   ears   Other    Patient says that all narcotics give her anaphylaxis.    Wellbutrin [bupropion] Other (See Comments)   "could not speak a full sentence for a whole year"      Medication List    STOP taking these medications   diphenhydrAMINE 25 MG tablet Commonly known as:  BENADRYL   furosemide 40 MG tablet Commonly known as:  LASIX   hydrALAZINE 50 MG tablet Commonly known as:  APRESOLINE   ibuprofen 200 MG tablet Commonly known as:  ADVIL,MOTRIN   LORazepam 0.5 MG tablet Commonly known as:  ATIVAN   ondansetron 4 MG tablet Commonly known as:  ZOFRAN   Potassium Chloride ER 20 MEQ Tbcr     TAKE these medications   albuterol 108 (90 Base) MCG/ACT inhaler Commonly known as:  PROVENTIL HFA;VENTOLIN HFA Inhale 2 puffs into the lungs every 4 (four) hours as needed.   aspirin EC 81 MG tablet Take 1 tablet (81 mg total) by mouth daily.   carvedilol 12.5 MG tablet Commonly known as:  COREG Take 1 tablet (12.5 mg total) by mouth 2 (two) times daily with a meal.   pantoprazole 40 MG tablet Commonly known as:  PROTONIX Take 1 tablet (40 mg total) by mouth daily at 12 noon. Start taking on:  09/25/2017   warfarin 5 MG tablet Commonly known as:  COUMADIN Take 1 tablet (5 mg total) by mouth daily.      Allergies  Allergen Reactions  . Codeine Anaphylaxis  . Tramadol Anaphylaxis    No hydrocodone or anything connected No hydrocodone or anything connected   . Clarithromycin Nausea And Vomiting  . Citalopram Other (See Comments)    Caused more depression   . Fish Allergy Itching    ears  . Other     Patient says that all narcotics give her anaphylaxis.   . Wellbutrin [Bupropion] Other (See  Comments)    "could not speak a full sentence for a whole year"    Contact information for follow-up providers    [facility] Follow up in 4 week(s).   Specialty:  Neurology Why:  stroke clinic. office will call with appt date and time.  Contact information: [address] [place] [phone]           Contact information for after-discharge care    Destination    [facility] .   Service:  Skilled Nursing Contact information: [address] [place] [phone]                   The results of significant diagnostics from this hospitalization (  including imaging, microbiology, ancillary and laboratory) are listed below for reference.    Significant Diagnostic Studies: Dg Abd 1 View  Result Date: 09/24/2017 CLINICAL DATA:  Epigastric pain x3 days.  Constipation. EXAM: ABDOMEN - 1 VIEW COMPARISON:  Chest CT from 09/20/2017. FINDINGS: Moderate stool burden within the right colon. No bowel obstruction or free air. Small right effusion with patchy bibasilar airspace disease. No radio-opaque calculi or other significant radiographic abnormality are seen. IMPRESSION: Moderate stool burden within the right colon.  No bowel obstruction. Electronically Signed   By: [place] Royalty M.D.   On: 09/24/2017 03:21   Ct Head Wo Contrast  Result Date: 09/20/2017 CLINICAL DATA:  62 y/o  F; numbness, tingling, and paresthesias. EXAM: CT HEAD WITHOUT CONTRAST TECHNIQUE: Contiguous axial images were obtained from the base of the skull through the vertex without intravenous contrast. COMPARISON:  09/19/2017 MRI of the head. FINDINGS: Brain: Small focus of infarction within the right occipitotemporal junction and multiple small foci of infarction throughout the cerebral hemispheres and left cerebellum are stable in comparison with the prior MRI given differences in technique. No significant associated  mass effect. No hemorrhage identified. No new acute intracranial abnormality. No effacement of basilar cisterns, hydrocephalus, or extra-axial collection. Vascular: No hyperdense vessel or unexpected calcification. Skull: Normal. Negative for fracture or focal lesion. Sinuses/Orbits: No acute finding. Other: None. IMPRESSION: 1. Right occipitotemporal junction infarction as well as multiple additional small infarctions throughout the cerebral hemispheres and left cerebellum are stable in comparison with the prior MRI given differences in technique. No associated hemorrhage or mass effect. 2. No new acute intracranial abnormality identified. Electronically Signed   By: Kristine Garbe M.D.   On: 09/20/2017 01:04   Ct Chest Without Contrast  Result Date: 09/20/2017 CLINICAL DATA:  62 year old female with shortness of breath. History of atrial fibrillation. EXAM: CT CHEST WITHOUT CONTRAST TECHNIQUE: Multidetector CT imaging of the chest was performed following the standard protocol without IV contrast. COMPARISON:  No priors. FINDINGS: Cardiovascular: Heart size is borderline enlarged. Small amount of pericardial fluid and/or thickening, unlikely to be of hemodynamic significance at this time, most evident underlying the atrioventricular groove and in superior pericardial recesses. No associated pericardial calcification. Aortic atherosclerosis. No definite coronary artery calcifications are identified. Mediastinum/Nodes: Multiple prominent borderline enlarged mediastinal and hilar lymph nodes are noted, nonspecific. Esophagus is unremarkable in appearance. No axillary lymphadenopathy. Lungs/Pleura: Small right and trace left pleural effusions lying dependently. Areas of dependent subsegmental atelectasis in the right lower lobe. Patchy areas of predominantly ground-glass attenuation consolidation in the lung bases bilaterally, with somewhat of a peripheral mass-like appearance in the left lower lobe where  there is more profound peripheral density with central areas of ground-glass attenuation and septal thickening, best appreciated on axial image 125 of series 7 and sagittal image 76 of series 6 where the largest lesion measures 3.7 x 2.2 x 3.8 cm. Diffuse bronchial wall thickening with mild to moderate centrilobular and paraseptal emphysema. Upper Abdomen: Trace amount of perihepatic ascites. Soft tissue stranding throughout the visualized retroperitoneum. Fluid attenuation in the region of the porta hepatis, suggesting edema. Musculoskeletal: Densely calcified bilateral breast implants. There are no aggressive appearing lytic or blastic lesions noted in the visualized portions of the skeleton. IMPRESSION: 1. Multifocal opacities in the lung bases bilaterally which have an unusual appearance. These are favored to reflect potential areas of cryptogenic organizing pneumonia (COP) based on the appearance of the largest lesion in the posterior left lower lobe which has a  suggestion of the so-called "atoll sign". However, the overall appearance is nonspecific. Differential considerations include atypical infectious etiologies, areas of alveolar hemorrhage in the setting of multifocal pulmonary infarction, and even neoplasm such as adenocarcinoma. At this time, repeat noncontrast chest CT is suggested in 3-6 weeks to re-evaluate these findings. If there is any clinical concern for acute pulmonary embolus at this time, further evaluation with PE protocol CT would be recommended. 2. Small right and trace left pleural effusions lying dependently. 3. Small amount of pericardial fluid and/or thickening, unlikely to be of hemodynamic significance at this time. 4. Mild diffuse bronchial wall thickening and mild to moderate centrilobular and paraseptal emphysema; imaging findings compatible with the clinical history of COPD. 5. Aortic atherosclerosis. These results were called by telephone at the time of interpretation on  09/20/2017 at 5:42 pm to Dr. Ivin Poot, who verbally acknowledged these results. Aortic Atherosclerosis (ICD10-I70.0) and Emphysema (ICD10-J43.9). Electronically Signed   By: Vinnie Langton M.D.   On: 09/20/2017 17:43   Mr Brain Wo Contrast  Result Date: 09/19/2017 CLINICAL DATA:  Stroke EXAM: MRI HEAD WITHOUT CONTRAST TECHNIQUE: Multiplanar, multiecho pulse sequences of the brain and surrounding structures were obtained without intravenous contrast. COMPARISON:  None. FINDINGS: Brain: Multiple acute infarcts are present bilaterally. Largest infarct in the right occipital lobe. Numerous small cortical and subcortical infarcts in both cerebral hemispheres. Small acute infarcts left cerebellum. Negative for hemorrhage or mass. Ventricle size normal. No midline shift. Minimal if any chronic ischemia. Vascular: Normal arterial flow voids Skull and upper cervical spine: Negative Sinuses/Orbits: Negative Other: None IMPRESSION: Multiple acute infarcts bilaterally consistent with cerebral emboli. No midline shift or hemorrhage. Electronically Signed   By: Franchot Gallo M.D.   On: 09/19/2017 11:40   US Abdomen Complete  Result Date: 09/16/2017 CLINICAL DATA:  Upper abdominal pain. EXAM: ABDOMEN ULTRASOUND COMPLETE COMPARISON:  None. FINDINGS: Gallbladder: Partially distended with diffuse gallbladder wall thickening measuring up to 9 mm. No gallstones or sludge. No pericholecystic fluid. No sonographic Keimig sign noted by sonographer. Common bile duct: Diameter: 5 mm. Liver: No focal lesion identified. Diffusely increased in parenchymal echogenicity. Portal vein is patent on color Doppler imaging with normal direction of blood flow towards the liver. IVC: No abnormality visualized. Pancreas: Majority obscured by bowel gas. Spleen: Size and appearance within normal limits. Right Kidney: Length: 8.0 cm. Thinning of the renal parenchyma. Questionable increased echogenicity. No mass or hydronephrosis visualized.  Left Kidney: Length: 10.6 cm. Echogenicity within normal limits. No mass or hydronephrosis visualized. Abdominal aorta: No aneurysm visualized. Atherosclerotic calcifications noted. Other findings: Bilateral pleural effusions. IMPRESSION: 1. Diffuse gallbladder wall thickening of 9 mm without gallstones or pericholecystic more phi sign. Findings favor systemic causes such as passive congestion or secondary to hepatic causes over primary gallbladder inflammation. No biliary dilatation. 2. Increased hepatic echogenicity suggests steatosis. 3. Small right kidney. 4. Bilateral pleural effusions. Electronically Signed   By: Jeb Levering M.D.   On: 09/16/2017 00:49   Dg Chest Port 1 View  Result Date: 09/16/2017 CLINICAL DATA:  Pulmonary edema EXAM: PORTABLE CHEST 1 VIEW COMPARISON:  Portable exam 0421 hours compared to 09/15/2017 FINDINGS: Upper normal heart size. Stable mediastinal contours. Diffuse pulmonary infiltrates in the mid to lower lungs bilaterally, could represent pulmonary edema or multifocal infection. No gross pleural effusion or pneumothorax. Bones unremarkable. IMPRESSION: Persistent BILATERAL pulmonary infiltrates. Electronically Signed   By: Lavonia Dana M.D.   On: 09/16/2017 07:49   Dg Chest [facility]  Result Date: 09/15/2017 CLINICAL DATA:  Shortness of breath. EXAM: PORTABLE CHEST 1 VIEW COMPARISON:  May 23, 2016 FINDINGS: Infiltrates are seen in the lung bases, similar in appearance to more remote studies. The heart size borderline. The hila are normal. No pneumothorax. No nodules or masses. IMPRESSION: Bibasilar infiltrates could represent developing ARDS versus pneumonia. Recommend clinical correlation. Recommend follow-up to resolution. Electronically Signed   By: Dorise Bullion III M.D   On: 09/15/2017 19:57   Mr Cardiac Morphology Wo Contrast  Result Date: 09/19/2017 CLINICAL DATA:  62 year old female with CHF and finding of a left ventricular mass on the echocardiogram.  EXAM: CARDIAC MRI TECHNIQUE: The patient was scanned on a 1.5 Tesla GE magnet. A dedicated cardiac coil was used. Functional imaging was done using Fiesta sequences. 2,3, and 4 chamber views were done to assess for RWMA's. Modified Simpson's rule using a short axis stack was used to calculate an ejection fraction on a dedicated work Conservation officer, nature. The patient didn't received contrast. After 10 minutes inversion recovery sequences were used to assess for infiltration and scar tissue. CONTRAST:  None.  GFR 30 ml/min FINDINGS: 1. Normal left ventricular size, thickness and systolic function (LVEF = 55-60%). There are no regional wall motion abnormalities. There is a large pedunculated mobile mass located at the left ventricular apex attached to the apical septal wall. The mass measures 26 x 23 x 16 mm. 2. Normal right ventricular size, thickness and systolic function (LVEF =). There are no regional wall motion abnormalities. 3. Normal left atrial size, mildly dilated right atrium. 4. Normal size of the aortic root, ascending aorta and pulmonary artery. 5. Trivial mitral and mild to moderate tricuspid regurgitation. 6. Normal pericardium. Trivial, inferiorly located pericardial effusion. IMPRESSION: 1. Normal left ventricular size, thickness and systolic function (LVEF = 55-60%). There are no regional wall motion abnormalities. There is a large pedunculated mobile mass located at the left ventricular apex attached to the apical septal wall. The mass measures 26 x 23 x 16 mm. There are no wall motion abnormalities in the left ventricular segments around the mass. The patient didn't receive gadolinium contrast that would help with tissue characterization. T1 with and without fat sat suggest no or low fat content. This could represent a myxoma, a thrombus can't be excluded. Additional early and late gadolinium enhancement images are recommended once GFR improves > 30 ml/min. Electronically Signed   By:  Ena Dawley   On: 09/19/2017 12:45    Microbiology: Recent Results (from the past 240 hour(s))  Blood Culture (routine x 2)     Status: None   Collection Time: 09/15/17  7:50 PM  Result Value Ref Range Status   Specimen Description BLOOD BLOOD LEFT FOREARM  Final   Special Requests   Final    BOTTLES DRAWN AEROBIC AND ANAEROBIC Blood Culture adequate volume   Culture   Final    NO GROWTH 5 DAYS Performed at [facility], [address], [place], [place] [address]    Report Status 09/20/2017 FINAL  Final  Blood Culture (routine x 2)     Status: None   Collection Time: 09/15/17  8:00 PM  Result Value Ref Range Status   Specimen Description BLOOD BLOOD RIGHT HAND  Final   Special Requests   Final    BOTTLES DRAWN AEROBIC AND ANAEROBIC Blood Culture adequate volume   Culture   Final    NO GROWTH 5 DAYS Performed at [facility],  [address], [place], [place] [address]    Report Status 09/20/2017 FINAL  Final  MRSA PCR Screening     Status: None   Collection Time: 09/16/17 12:36 AM  Result Value Ref Range Status   MRSA by PCR NEGATIVE NEGATIVE Final    Comment:        The GeneXpert MRSA Assay (FDA approved for NASAL specimens only), is one component of a comprehensive MRSA colonization surveillance program. It is not intended to diagnose MRSA infection nor to guide or monitor treatment for MRSA infections. Performed at [facility], [place] [address], [place], [place] [address]      Labs: Basic Metabolic Panel: Recent Labs  Lab 09/18/17 0238 09/19/17 0913 09/20/17 0319 09/21/17 1037  NA 127* 129* 129* 130*  K 5.1 4.4 4.0 3.7  CL 90* 94* 97* 100*  CO2 20* 23 23 20*  GLUCOSE 114* 94 114* 121*  BUN 48* 49* 43* 29*  CREATININE 2.22* 1.93* 1.83* 1.64*  CALCIUM 8.7* 8.5* 7.9* 8.2*   Liver Function Tests: Recent Labs  Lab 09/21/17 0250  AST 23  ALT 44  ALKPHOS 65  BILITOT 1.3*  PROT 5.6*  ALBUMIN 2.7*   No results for input(s):  LIPASE, AMYLASE in the last 168 hours. No results for input(s): AMMONIA in the last 168 hours. CBC: Recent Labs  Lab 09/18/17 0238 09/19/17 0314 09/21/17 0250 09/22/17 0251  WBC 12.1* 9.3 8.7 9.3  HGB 10.5* 9.5* 9.4* 10.0*  HCT 35.4* 32.2* 31.8* 33.6*  MCV 78.7 78.3 79.1 79.8  PLT 222 247 283 304   Cardiac Enzymes: Recent Labs  Lab 09/19/17 0913  TROPONINI 3.29*   BNP: BNP (last 3 results) Recent Labs    09/15/17 2006 09/16/17 0445 09/17/17 0237  BNP >4,500.0* >4,500.0* 3,035.3*    ProBNP (last 3 results) No results for input(s): PROBNP in the last 8760 hours.  CBG: No results for input(s): GLUCAP in the last 168 hours.  Signed:  Velvet Bathe MD.  [facility] 09/24/2017, 3:46 PM

## 2017-09-24 NOTE — Progress Notes (Signed)
Last PT note 3/29- [employer] requesting updated notes- contacted PT who prioritized seeing patient  CSW sent updated PT notes to Miquel [facility] to sent to insurance for approval- still awaiting response from [facility] regarding pt transfer to SNF  CSW will continue to follow  Jorge Ny, [place] Social Worker [phone]

## 2017-09-24 NOTE — Clinical Social Work Note (Signed)
Clinical Social Worker facilitated patient discharge including contacting patient family and facility to confirm patient discharge plans.  Clinical information faxed to facility and family agreeable with plan.  CSW arranged transport via patient sister, Adela Lank to [facility]  RN to call report prior to discharge [phone]).  Clinical Social Worker will sign off for now as social work intervention is no longer needed. Please consult [place] again if new need arises.  Barbette Or, [place]

## 2017-10-05 ENCOUNTER — Encounter: Payer: Self-pay | Admitting: Cardiovascular Disease

## 2017-10-09 ENCOUNTER — Encounter (HOSPITAL_COMMUNITY): Payer: Self-pay

## 2017-10-09 ENCOUNTER — Telehealth: Payer: Self-pay | Admitting: *Deleted

## 2017-10-09 ENCOUNTER — Observation Stay (HOSPITAL_COMMUNITY)
Admission: EM | Admit: 2017-10-09 | Discharge: 2017-10-10 | Disposition: A | Discharge status: Home / Self Care | Payer: BLUE CROSS/BLUE SHIELD | Attending: Family Medicine | Admitting: Family Medicine

## 2017-10-09 ENCOUNTER — Emergency Department (HOSPITAL_COMMUNITY): Payer: BLUE CROSS/BLUE SHIELD

## 2017-10-09 ENCOUNTER — Other Ambulatory Visit: Payer: Self-pay

## 2017-10-09 DIAGNOSIS — N183 Chronic kidney disease, stage 3 unspecified: Secondary | ICD-10-CM

## 2017-10-09 DIAGNOSIS — J449 Chronic obstructive pulmonary disease, unspecified: Secondary | ICD-10-CM | POA: Diagnosis not present

## 2017-10-09 DIAGNOSIS — Z87891 Personal history of nicotine dependence: Secondary | ICD-10-CM | POA: Insufficient documentation

## 2017-10-09 DIAGNOSIS — Z8673 Personal history of transient ischemic attack (TIA), and cerebral infarction without residual deficits: Secondary | ICD-10-CM | POA: Insufficient documentation

## 2017-10-09 DIAGNOSIS — R0682 Tachypnea, not elsewhere classified: Secondary | ICD-10-CM | POA: Diagnosis not present

## 2017-10-09 DIAGNOSIS — I5189 Other ill-defined heart diseases: Secondary | ICD-10-CM

## 2017-10-09 DIAGNOSIS — E876 Hypokalemia: Secondary | ICD-10-CM

## 2017-10-09 DIAGNOSIS — R7989 Other specified abnormal findings of blood chemistry: Secondary | ICD-10-CM | POA: Diagnosis present

## 2017-10-09 DIAGNOSIS — R0602 Shortness of breath: Secondary | ICD-10-CM | POA: Diagnosis present

## 2017-10-09 DIAGNOSIS — I110 Hypertensive heart disease with heart failure: Secondary | ICD-10-CM | POA: Insufficient documentation

## 2017-10-09 DIAGNOSIS — I6340 Cerebral infarction due to embolism of unspecified cerebral artery: Secondary | ICD-10-CM | POA: Diagnosis present

## 2017-10-09 DIAGNOSIS — I509 Heart failure, unspecified: Principal | ICD-10-CM

## 2017-10-09 DIAGNOSIS — R0600 Dyspnea, unspecified: Secondary | ICD-10-CM

## 2017-10-09 DIAGNOSIS — R778 Other specified abnormalities of plasma proteins: Secondary | ICD-10-CM | POA: Diagnosis present

## 2017-10-09 DIAGNOSIS — Z79899 Other long term (current) drug therapy: Secondary | ICD-10-CM | POA: Diagnosis not present

## 2017-10-09 DIAGNOSIS — Z7901 Long term (current) use of anticoagulants: Secondary | ICD-10-CM | POA: Insufficient documentation

## 2017-10-09 DIAGNOSIS — I5042 Chronic combined systolic (congestive) and diastolic (congestive) heart failure: Secondary | ICD-10-CM | POA: Diagnosis not present

## 2017-10-09 DIAGNOSIS — I5033 Acute on chronic diastolic (congestive) heart failure: Secondary | ICD-10-CM | POA: Diagnosis not present

## 2017-10-09 HISTORY — DX: Unspecified atrial fibrillation: I48.91

## 2017-10-09 HISTORY — DX: Unspecified viral hepatitis C without hepatic coma: B19.20

## 2017-10-09 HISTORY — DX: Other pulmonary embolism without acute cor pulmonale: I26.99

## 2017-10-09 HISTORY — DX: Cerebral infarction, unspecified: I63.9

## 2017-10-09 HISTORY — DX: Chronic obstructive pulmonary disease, unspecified: J44.9

## 2017-10-09 HISTORY — DX: Heart failure, unspecified: I50.9

## 2017-10-09 LAB — CBC WITH DIFFERENTIAL/PLATELET
BASOS ABS: 0.1 10*3/uL (ref 0.0–0.1)
BASOS PCT: 1 %
EOS ABS: 0.2 10*3/uL (ref 0.0–0.7)
Eosinophils Relative: 2 %
HCT: 35.4 % — ABNORMAL LOW (ref 36.0–46.0)
HEMOGLOBIN: 10 g/dL — AB (ref 12.0–15.0)
Lymphocytes Relative: 23 %
Lymphs Abs: 2.4 10*3/uL (ref 0.7–4.0)
MCH: 21.8 pg — ABNORMAL LOW (ref 26.0–34.0)
MCHC: 28.2 g/dL — AB (ref 30.0–36.0)
MCV: 77.3 fL — ABNORMAL LOW (ref 78.0–100.0)
MONOS PCT: 11 %
Monocytes Absolute: 1.2 10*3/uL — ABNORMAL HIGH (ref 0.1–1.0)
NEUTROS PCT: 63 %
Neutro Abs: 6.6 10*3/uL (ref 1.7–7.7)
Platelets: 285 10*3/uL (ref 150–400)
RBC: 4.58 MIL/uL (ref 3.87–5.11)
RDW: 17.1 % — ABNORMAL HIGH (ref 11.5–15.5)
WBC: 10.4 10*3/uL (ref 4.0–10.5)

## 2017-10-09 LAB — URINALYSIS, ROUTINE W REFLEX MICROSCOPIC
BILIRUBIN URINE: NEGATIVE
GLUCOSE, UA: NEGATIVE mg/dL
HGB URINE DIPSTICK: NEGATIVE
Ketones, ur: NEGATIVE mg/dL
Leukocytes, UA: NEGATIVE
Nitrite: NEGATIVE
PH: 8 (ref 5.0–8.0)
Protein, ur: NEGATIVE mg/dL
SPECIFIC GRAVITY, URINE: 1.004 — AB (ref 1.005–1.030)

## 2017-10-09 LAB — BRAIN NATRIURETIC PEPTIDE

## 2017-10-09 LAB — PROTIME-INR
INR: 1.73
Prothrombin Time: 20.1 seconds — ABNORMAL HIGH (ref 11.4–15.2)

## 2017-10-09 LAB — BASIC METABOLIC PANEL
Anion gap: 13 (ref 5–15)
BUN: 12 mg/dL (ref 6–20)
CALCIUM: 8.2 mg/dL — AB (ref 8.9–10.3)
CO2: 27 mmol/L (ref 22–32)
CREATININE: 1.13 mg/dL — AB (ref 0.44–1.00)
Chloride: 100 mmol/L — ABNORMAL LOW (ref 101–111)
GFR calc non Af Amer: 51 mL/min — ABNORMAL LOW (ref >60)
GFR, EST AFRICAN AMERICAN: 59 mL/min — AB (ref >60)
Glucose, Bld: 104 mg/dL — ABNORMAL HIGH (ref 65–99)
Potassium: 2.6 mmol/L — CL (ref 3.5–5.1)
Sodium: 140 mmol/L (ref 135–145)

## 2017-10-09 LAB — TROPONIN I: Troponin I: 0.07 ng/mL (ref <0.03)

## 2017-10-09 MED ORDER — SODIUM CHLORIDE 0.9 % IV SOLN: 250.0000 mL | INTRAVENOUS | Status: DC | PRN

## 2017-10-09 MED ORDER — MAGNESIUM SULFATE 2 GM/50ML IV SOLN
2.0000 g | INTRAVENOUS | Status: AC
  Administered 2017-10-09: 2 g via INTRAVENOUS
  Filled 2017-10-09: qty 50

## 2017-10-09 MED ORDER — POTASSIUM CHLORIDE 10 MEQ/100ML IV SOLN
10.0000 meq | INTRAVENOUS | Status: AC
  Administered 2017-10-09 – 2017-10-10 (×4): 10 meq via INTRAVENOUS
  Filled 2017-10-09 (×2): qty 100

## 2017-10-09 MED ORDER — FUROSEMIDE 10 MG/ML IJ SOLN
40.0000 mg | Freq: Two times a day (BID) | INTRAMUSCULAR | Status: DC
  Administered 2017-10-10: 40 mg via INTRAVENOUS
  Filled 2017-10-09: qty 4

## 2017-10-09 MED ORDER — ACETAMINOPHEN 650 MG RE SUPP: 650.0000 mg | Freq: Four times a day (QID) | RECTAL | Status: DC | PRN

## 2017-10-09 MED ORDER — ACETAMINOPHEN 325 MG PO TABS: 650.0000 mg | ORAL_TABLET | Freq: Four times a day (QID) | ORAL | Status: DC | PRN

## 2017-10-09 MED ORDER — POTASSIUM CHLORIDE 10 MEQ/100ML IV SOLN
10.0000 meq | Freq: Once | INTRAVENOUS | Status: AC
  Administered 2017-10-09: 10 meq via INTRAVENOUS
  Filled 2017-10-09: qty 100

## 2017-10-09 MED ORDER — IPRATROPIUM-ALBUTEROL 0.5-2.5 (3) MG/3ML IN SOLN: 3.0000 mL | Freq: Four times a day (QID) | RESPIRATORY_TRACT | Status: DC | PRN

## 2017-10-09 MED ORDER — SODIUM CHLORIDE 0.9% FLUSH
3.0000 mL | Freq: Two times a day (BID) | INTRAVENOUS | Status: DC
  Administered 2017-10-09 – 2017-10-10 (×2): 3 mL via INTRAVENOUS

## 2017-10-09 MED ORDER — WARFARIN SODIUM 5 MG PO TABS
5.0000 mg | ORAL_TABLET | Freq: Every day | ORAL | Status: DC
  Administered 2017-10-09: 5 mg via ORAL
  Filled 2017-10-09: qty 1

## 2017-10-09 MED ORDER — FUROSEMIDE 10 MG/ML IJ SOLN
40.0000 mg | INTRAMUSCULAR | Status: AC
  Administered 2017-10-09: 40 mg via INTRAVENOUS
  Filled 2017-10-09: qty 4

## 2017-10-09 MED ORDER — WARFARIN - PHYSICIAN DOSING INPATIENT: Freq: Every day | Status: DC

## 2017-10-09 MED ORDER — CARVEDILOL 12.5 MG PO TABS
12.5000 mg | ORAL_TABLET | Freq: Two times a day (BID) | ORAL | Status: DC
  Administered 2017-10-09 – 2017-10-10 (×2): 12.5 mg via ORAL
  Filled 2017-10-09 (×2): qty 1

## 2017-10-09 MED ORDER — NITROGLYCERIN 0.4 MG SL SUBL: 0.4000 mg | SUBLINGUAL_TABLET | SUBLINGUAL | Status: DC | PRN

## 2017-10-09 MED ORDER — WARFARIN - PHARMACIST DOSING INPATIENT: Freq: Every day | Status: DC

## 2017-10-09 MED ORDER — LORAZEPAM 0.5 MG PO TABS
0.5000 mg | ORAL_TABLET | Freq: Three times a day (TID) | ORAL | Status: DC
  Administered 2017-10-09 – 2017-10-10 (×3): 0.5 mg via ORAL
  Filled 2017-10-09 (×3): qty 1

## 2017-10-09 MED ORDER — SODIUM CHLORIDE 0.9% FLUSH
3.0000 mL | INTRAVENOUS | Status: DC | PRN
  Administered 2017-10-09: 3 mL via INTRAVENOUS
  Filled 2017-10-09: qty 3

## 2017-10-09 MED ORDER — ONDANSETRON HCL 4 MG/2ML IJ SOLN: 4.0000 mg | Freq: Four times a day (QID) | INTRAMUSCULAR | Status: DC | PRN

## 2017-10-09 MED ORDER — ONDANSETRON HCL 4 MG PO TABS: 4.0000 mg | ORAL_TABLET | Freq: Four times a day (QID) | ORAL | Status: DC | PRN

## 2017-10-09 MED ORDER — POTASSIUM CHLORIDE CRYS ER 20 MEQ PO TBCR
40.0000 meq | EXTENDED_RELEASE_TABLET | Freq: Two times a day (BID) | ORAL | Status: DC
  Administered 2017-10-09 – 2017-10-10 (×3): 40 meq via ORAL
  Filled 2017-10-09 (×3): qty 2

## 2017-10-09 NOTE — ED Triage Notes (Signed)
Pt reports diarrhea x 3 days and sob.  Reports vomited x 2.  Pt has been eating ice to try to stay hydrated.  Noted swelling to bilateral lower extremities and left forearm.  Pt also c/o intermittent bilateral jaw pain.    EMS started IV and administered 1106ml bolus NS.

## 2017-10-09 NOTE — Progress Notes (Signed)
Patient arrived to room 304,placed on telemetry box 24. Report was given by Cruzita Lederer in ED. Vital signs stable. No c/o pain or discomfort noted. Will continue to monitor patient.

## 2017-10-09 NOTE — H&P (Signed)
History and Physical    Glenda Johnson LYY:[id number] DOB: [birth year] DOA: 10/09/2017  PCP: Glenda Housekeeper, MD   Patient coming from: Home  Chief Complaint: Progressive dyspnea and LE edema  HPI: Glenda Johnson is a 62 y.o. female with medical history significant for prior CVA, CAD, atrial fibrillation on Coumadin, chronic grade 2 diastolic CHF, CKD stage III, and COPD who presented to the emergency department with progressively worsening shortness of breath.  She has also had some swelling to her bilateral lower extremities and generally complains of some weakness as well.  This is despite taking her usual home Lasix as well as Coumadin.  She was recently discharged from the hospital after being admitted with respiratory distress secondary to pulmonary edema with acute on chronic diastolic heart failure.  She then went to inpatient rehabilitation and has been home this past week.  She was visited by her home health nurse who noted that she was more tachypneic and recommended further evaluation in the ED. Patient denies any cough, fever, or chills.   ED Course: Vital signs are stable.  Laboratory data remarkable for potassium 2.6.  BNP is also noted to be greater than 4500, but she is noted to have BNP elevations chronically in the 3000-4000 range.  Two-view chest x-ray compared to previous x-ray demonstrates some improvement in effusions.  INR is currently subtherapeutic at 1.7 despite Coumadin use.  EKG demonstrates some sinus tachycardia at 101 bpm.  She is currently in no acute respiratory distress and is on room air.  She appears to have been ordered 80 mg of IV Lasix and has been ordered IV magnesium sulfate as well as IV potassium, but only 10 mEq.  Review of Systems: All others reviewed and otherwise negative.  Past Medical History:  Diagnosis Date  . Atrial fibrillation ([place])   . CHF (congestive heart failure) ([place])   . COPD (chronic obstructive pulmonary disease) ([place])   . Hepatitis C    . Pulmonary thrombosis ([place])   . Renal artery stenosis ([place])   . Secondary hypertension   . Stroke ([place])   . Tobacco abuse     Past Surgical History:  Procedure Laterality Date  . BACK SURGERY    . CARDIAC CATHETERIZATION N/A 05/25/2016   Procedure: Left Heart Cath and Coronary Angiography;  Surgeon: Lorretta Harp, MD;  Location: [facility];  Service: Cardiovascular;  Laterality: N/A;  . stent renal artery       reports that she has quit smoking. She has never used smokeless tobacco. She reports that she does not drink alcohol or use drugs.  Allergies  Allergen Reactions  . Codeine Anaphylaxis  . Tramadol Anaphylaxis    No hydrocodone or anything connected No hydrocodone or anything connected   . Clarithromycin Nausea And Vomiting  . Citalopram Other (See Comments)    Caused more depression   . Fish Allergy Itching    ears  . Other     Patient says that all narcotics give her anaphylaxis.   . Wellbutrin [Bupropion] Other (See Comments)    "could not speak a full sentence for a whole year"    Family History  Problem Relation Age of Onset  . Diabetes Maternal Grandmother     Prior to Admission medications   Medication Sig Start Date End Date Taking? Authorizing Provider  albuterol (PROVENTIL HFA;VENTOLIN HFA) 108 (90 Base) MCG/ACT inhaler Inhale 2 puffs into the lungs every 4 (four) hours as needed. 09/10/17 09/10/18 Yes [provider]  carvedilol (COREG) 12.5 MG tablet Take 1 tablet (12.5 mg total) by mouth 2 (two) times daily with a meal. 09/24/17  Yes Velvet Bathe, MD  furosemide (LASIX) 40 MG tablet Take 40 mg by mouth daily.  10/04/17  Yes [provider]  LORazepam (ATIVAN) 0.5 MG tablet Take 0.5 mg by mouth every 8 (eight) hours.   Yes [provider]  nitroGLYCERIN (NITROSTAT) 0.4 MG SL tablet Place 0.4 mg under the tongue every 5 (five) minutes as needed for chest pain.   Yes [provider]  warfarin (COUMADIN) 5 MG  tablet Take 1 tablet (5 mg total) by mouth daily. Patient taking differently: Take 2.5-5 mg by mouth daily. 2.5mg  daily except take 5mg  on Friday 09/24/17  Yes Velvet Bathe, MD  pantoprazole (PROTONIX) 40 MG tablet Take 1 tablet (40 mg total) by mouth daily at 12 noon. 09/25/17   Velvet Bathe, MD    Physical Exam: Vitals:   10/09/17 1300 10/09/17 1330 10/09/17 1400 10/09/17 1446  BP: (!) 135/91 120/82 (!) 130/92 (!) 136/95  Pulse: 90   94  Resp: 20 (!) 24 (!) 26 (!) 26  Temp:      TempSrc:      SpO2: 97%   97%  Weight:      Height:        Constitutional: NAD, calm, comfortable Vitals:   10/09/17 1300 10/09/17 1330 10/09/17 1400 10/09/17 1446  BP: (!) 135/91 120/82 (!) 130/92 (!) 136/95  Pulse: 90   94  Resp: 20 (!) 24 (!) 26 (!) 26  Temp:      TempSrc:      SpO2: 97%   97%  Weight:      Height:       Eyes: lids and conjunctivae normal ENMT: Mucous membranes are moist.  Neck: normal, supple Respiratory: clear to auscultation bilaterally. Normal respiratory effort. No accessory muscle use.  Cardiovascular: Regular rate and rhythm, no murmurs.  Bilateral lower extremity edema of 1-2+ with pitting. Abdomen: no tenderness, no distention. Bowel sounds positive.  Musculoskeletal:  No joint deformity upper and lower extremities.   Skin: no rashes, lesions, ulcers.  Psychiatric: Normal judgment and insight. Alert and oriented x 3. Normal mood.   Labs on Admission: I have personally reviewed following labs and imaging studies  CBC: Recent Labs  Lab 10/09/17 1227  WBC 10.4  NEUTROABS 6.6  HGB 10.0*  HCT 35.4*  MCV 77.3*  PLT 157   Basic Metabolic Panel: Recent Labs  Lab 10/09/17 1227  NA 140  K 2.6*  CL 100*  CO2 27  GLUCOSE 104*  BUN 12  CREATININE 1.13*  CALCIUM 8.2*   GFR: Estimated Creatinine Clearance: 49.6 mL/min (A) (by C-G formula based on SCr of 1.13 mg/dL (H)). Liver Function Tests: No results for input(s): AST, ALT, ALKPHOS, BILITOT, PROT, ALBUMIN in  the last 168 hours. No results for input(s): LIPASE, AMYLASE in the last 168 hours. No results for input(s): AMMONIA in the last 168 hours. Coagulation Profile: Recent Labs  Lab 10/09/17 1232  INR 1.73   Cardiac Enzymes: Recent Labs  Lab 10/09/17 1227  TROPONINI 0.07*   BNP (last 3 results) No results for input(s): PROBNP in the last 8760 hours. HbA1C: No results for input(s): HGBA1C in the last 72 hours. CBG: No results for input(s): GLUCAP in the last 168 hours. Lipid Profile: No results for input(s): CHOL, HDL, LDLCALC, TRIG, CHOLHDL, LDLDIRECT in the last 72 hours. Thyroid Function Tests: No results for  input(s): TSH, T4TOTAL, FREET4, T3FREE, THYROIDAB in the last 72 hours. Anemia Panel: No results for input(s): VITAMINB12, FOLATE, FERRITIN, TIBC, IRON, RETICCTPCT in the last 72 hours. Urine analysis:    Component Value Date/Time   COLORURINE STRAW (A) 10/09/2017 1325   APPEARANCEUR CLEAR 10/09/2017 1325   LABSPEC 1.004 (L) 10/09/2017 1325   PHURINE 8.0 10/09/2017 1325   GLUCOSEU NEGATIVE 10/09/2017 1325   HGBUR NEGATIVE 10/09/2017 1325   BILIRUBINUR NEGATIVE 10/09/2017 1325   KETONESUR NEGATIVE 10/09/2017 1325   PROTEINUR NEGATIVE 10/09/2017 1325   NITRITE NEGATIVE 10/09/2017 1325   LEUKOCYTESUR NEGATIVE 10/09/2017 1325    Radiological Exams on Admission: Dg Chest 2 View  Result Date: 10/09/2017 CLINICAL DATA:  Short of breath EXAM: CHEST - 2 VIEW COMPARISON:  09/16/2017 FINDINGS: Extensive bibasilar airspace disease with mild interval improvement. Bilateral pleural effusions also have improved. Underlying chronic lung disease. IMPRESSION: Interval improvement in bibasilar infiltrate and effusion. Electronically Signed   By: Franchot Gallo M.D.   On: 10/09/2017 13:09    EKG: Independently reviewed.  Sinus tachycardia at 101 bpm.  Assessment/Plan Principal Problem:   Congestive heart failure (HCC) Active Problems:   Elevated troponin   COPD (chronic  obstructive pulmonary disease) (HCC)   Cerebrovascular accident (CVA) due to embolism of cerebral artery (HCC)   Cardiac mass   CKD (chronic kidney disease) stage 3, GFR 30-59 ml/min (HCC)   Hypokalemia   Dyspnea    1. Dyspnea possibly secondary to acute on chronic diastolic CHF decompensation.  Continue diuresis with Lasix to be scheduled at 40 mg IV twice daily.  Strict I's and O's and daily weights.  Fluid restriction.  Placement of Purewick during diuresis. 2. Severe hypokalemia.  Monitor on telemetry and recheck a.m. labs with aggressive repletion given current diuresis with Lasix.  We will plan to give 60 mEq IV and 40 p.o. with repeat oral dosing. 3. Prior CVA.  Continue Coumadin with pharmacy to monitor. 4. Elevated troponin.  Patient denies any chest pain and I do not feel she would benefit from any repeat monitoring at this time. 5. History of COPD.  Currently does not have any acute bronchospasms but will order breathing treatments as needed.  She is actually scheduled to see Dr. Luan Pulling outpatient in the near future and would like to see him inpatient should she not have any improvement in her symptoms with diuresis. 6. CKD stage III.  Currently at baseline or better.  Continue to monitor labs with ongoing diuresis.   DVT prophylaxis: Continue Coumadin Code Status: Full Family Communication: None at bedside Disposition Plan: Plan to diurese and replace potassium aggressively.  Reassess symptoms and consider pulmonology consult. Consults called: None Admission status: Observation, telemetry   Joseluis Alessio Darleen Crocker DO [facility] Pager [phone]  If 7PM-7AM, please contact night-coverage [email or web address] Password [facility]  10/09/2017, 2:56 PM

## 2017-10-09 NOTE — ED Provider Notes (Signed)
[facility] EMERGENCY DEPARTMENT Provider Note   CSN: [id number] Arrival date & time: 10/09/17  1147     History   Chief Complaint Chief Complaint  Patient presents with  . Shortness of Breath    HPI Glenda Johnson is a 62 y.o. female.  HPI  The patient is a 62 year old female, chief complaint of shortness of breath  Prior medical records reviewed including most recent admission: Troponin was elevated, echocardiogram showed 50-55% ejection fraction with an abnormal left ventricular mass  she has a very complicated medical history including some element of COPD, paroxysmal atrial fibrillation but also has chronic systolic and diastolic congestive heart failure, history of previously treated hepatitis C who had recently been admitted to the hospital with respiratory distress and pulmonary edema.  While she was in the hospital she was found to have a echocardiogram showing a left ventricular thrombus or mass ultimately thought to be a thrombus more likely, she was started on Coumadin, she had a likely embolic stroke related to that underlying thrombus and underlying atrial fibrillation and an MRI showed multiple acute infarcts.  She had a history of some demand ischemia, cardiology recommended no further testing at that point even though the troponin increased into the 9 range.  She has a history of stable COPD and it was noted that her kidney function was around 1.5, she was diuresed and sent to a skilled nursing facility where she stayed until approximately 1 week ago when she went home.  She has had diarrhea since being in the facility and going home.  She states that she has been able to eat and drink without difficulty but notices frequent watery stools and increasing swelling of her legs and shortness of breath.  The chief complaint is actually shortness of breath, this is been going on for several days, gradually worsening, associated with some mild tachypnea and shortness of breath,  states that she did not have oxygen to wear.  She has associated swelling of the bilateral lower extremities as well as the left upper extremity.  She is currently taking Lasix and Coumadin.  Symptoms are persistent and gradually worsening.  Past Medical History:  Diagnosis Date  . Atrial fibrillation ([place])   . CHF (congestive heart failure) ([place])   . COPD (chronic obstructive pulmonary disease) ([place])   . Hepatitis C   . Pulmonary thrombosis ([place])   . Renal artery stenosis ([place])   . Secondary hypertension   . Stroke ([place])   . Tobacco abuse     Patient Active Problem List   Diagnosis Date Noted  . Cerebral thrombosis with cerebral infarction 09/20/2017  . Congestive heart failure ([place])   . Cerebrovascular accident (CVA) due to embolism of cerebral artery ([place])   . Cardiac mass   . Anticoagulation adequate   . Flash pulmonary edema ([place]) 09/15/2017  . Community acquired pneumonia   . Abnormal echocardiogram   . Hypoxia 05/22/2016  . Acute respiratory failure with hypoxia ([place]) 05/22/2016  . Acute respiratory failure ([place]) 05/22/2016  . Sepsis ([place]) 05/22/2016  . Elevated troponin 05/22/2016  . COPD (chronic obstructive pulmonary disease) ([place]) 05/22/2016  . AKI (acute kidney injury) ([place]) 05/22/2016  . Acute pulmonary edema [facility])     Past Surgical History:  Procedure Laterality Date  . BACK SURGERY    . CARDIAC CATHETERIZATION N/A 05/25/2016   Procedure: Left Heart Cath and Coronary Angiography;  Surgeon: Lorretta Harp, MD;  Location: [place] [facility];  Service: Cardiovascular;  Laterality:  N/A;  . stent renal artery       OB History   None      Home Medications    Prior to Admission medications   Medication Sig Start Date End Date Taking? Authorizing Provider  albuterol (PROVENTIL HFA;VENTOLIN HFA) 108 (90 Base) MCG/ACT inhaler Inhale 2 puffs into the lungs every 4 (four) hours as needed. 09/10/17 09/10/18 Yes [provider]  carvedilol (COREG) 12.5  MG tablet Take 1 tablet (12.5 mg total) by mouth 2 (two) times daily with a meal. 09/24/17  Yes Velvet Bathe, MD  furosemide (LASIX) 40 MG tablet Take 40 mg by mouth daily.  10/04/17  Yes [provider]  LORazepam (ATIVAN) 0.5 MG tablet Take 0.5 mg by mouth every 8 (eight) hours.   Yes [provider]  nitroGLYCERIN (NITROSTAT) 0.4 MG SL tablet Place 0.4 mg under the tongue every 5 (five) minutes as needed for chest pain.   Yes [provider]  warfarin (COUMADIN) 5 MG tablet Take 1 tablet (5 mg total) by mouth daily. Patient taking differently: Take 2.5-5 mg by mouth daily. 2.5mg  daily except take 5mg  on Friday 09/24/17  Yes Velvet Bathe, MD  pantoprazole (PROTONIX) 40 MG tablet Take 1 tablet (40 mg total) by mouth daily at 12 noon. 09/25/17   Velvet Bathe, MD    Family History Family History  Problem Relation Age of Onset  . Diabetes Maternal Grandmother     Social History Social History   Tobacco Use  . Smoking status: Former Research scientist (life sciences)  . Smokeless tobacco: Never Used  Substance Use Topics  . Alcohol use: No  . Drug use: No     Allergies   Codeine; Tramadol; Clarithromycin; Citalopram; Fish allergy; Other; and Wellbutrin [bupropion]   Review of Systems Review of Systems  All other systems reviewed and are negative.    Physical Exam Updated Vital Signs BP (!) 135/91   Pulse 90   Temp 98.1 F (36.7 C) (Oral)   Resp 20   Ht 5\' 7"  (1.702 m)   Wt 67.1 kg (148 lb)   SpO2 97%   BMI 23.18 kg/m   Physical Exam  Constitutional: She appears well-developed and well-nourished. No distress.  HENT:  Head: Normocephalic and atraumatic.  Mouth/Throat: Oropharynx is clear and moist. No oropharyngeal exudate.  Eyes: Pupils are equal, round, and reactive to light. Conjunctivae and EOM are normal. Right eye exhibits no discharge. Left eye exhibits no discharge. No scleral icterus.  Neck: Normal range of motion. Neck supple. No JVD present. No thyromegaly  present.  Cardiovascular: Normal heart sounds and intact distal pulses. Exam reveals no gallop and no friction rub.  No murmur heard. Mild tachycardia to approximately 105 but irregular, peripheral pulses are palpable, there is no obvious JVD, she does have bilateral lower extremity pitting edema  Pulmonary/Chest: No respiratory distress. She has no wheezes. She has rales ( Rales present at the left base, mild tachypnea).  Abdominal: Soft. Bowel sounds are normal. She exhibits no distension and no mass. There is no tenderness.  Musculoskeletal: Normal range of motion. She exhibits edema. She exhibits no tenderness.  Bilateral lower extremity edema as well as some mild edema of the left upper extremity  Lymphadenopathy:    She has no cervical adenopathy.  Neurological: She is alert. Coordination normal.  Speech is clear, strength is normal, coordination is normal, no facial droop  Skin: Skin is warm and dry. No rash noted. No erythema.  Psychiatric: She has a normal  mood and affect. Her behavior is normal.  Nursing note and vitals reviewed.    ED Treatments / Results  Labs (all labs ordered are listed, but only abnormal results are displayed) Labs Reviewed  CBC WITH DIFFERENTIAL/PLATELET - Abnormal; Notable for the following components:      Result Value   Hemoglobin 10.0 (*)    HCT 35.4 (*)    MCV 77.3 (*)    MCH 21.8 (*)    MCHC 28.2 (*)    RDW 17.1 (*)    Monocytes Absolute 1.2 (*)    All other components within normal limits  BASIC METABOLIC PANEL - Abnormal; Notable for the following components:   Potassium 2.6 (*)    Chloride 100 (*)    Glucose, Bld 104 (*)    Creatinine, Ser 1.13 (*)    Calcium 8.2 (*)    GFR calc non Af Amer 51 (*)    GFR calc Af Amer 59 (*)    All other components within normal limits  TROPONIN I - Abnormal; Notable for the following components:   Troponin I 0.07 (*)    All other components within normal limits  BRAIN NATRIURETIC PEPTIDE -  Abnormal; Notable for the following components:   B Natriuretic Peptide >4,500.0 (*)    All other components within normal limits  PROTIME-INR - Abnormal; Notable for the following components:   Prothrombin Time 20.1 (*)    All other components within normal limits  URINALYSIS, ROUTINE W REFLEX MICROSCOPIC - Abnormal; Notable for the following components:   Color, Urine STRAW (*)    Specific Gravity, Urine 1.004 (*)    All other components within normal limits  C DIFFICILE QUICK SCREEN W PCR REFLEX    EKG EKG Interpretation  Date/Time:  Tuesday October 09 2017 11:57:37 EDT Ventricular Rate:  101 PR Interval:    QRS Duration: 66 QT Interval:  436 QTC Calculation: 497 R Axis:   67 Text Interpretation:  Sinus tachycardia, consider MAT Multiple premature complexes, vent & supraven Low voltage, precordial leads Nonspecific T abnrm, anterolateral leads Borderline prolonged QT interval Reconfirmed by Noemi Chapel [phone]) on 10/09/2017 12:26:40 PM   Radiology Dg Chest 2 View  Result Date: 10/09/2017 CLINICAL DATA:  Short of breath EXAM: CHEST - 2 VIEW COMPARISON:  09/16/2017 FINDINGS: Extensive bibasilar airspace disease with mild interval improvement. Bilateral pleural effusions also have improved. Underlying chronic lung disease. IMPRESSION: Interval improvement in bibasilar infiltrate and effusion. Electronically Signed   By: Franchot Gallo M.D.   On: 10/09/2017 13:09    Procedures Procedures (including critical care time)  Medications Ordered in ED Medications  magnesium sulfate IVPB 2 g 50 mL (has no administration in time range)  potassium chloride 10 mEq in 100 mL IVPB (has no administration in time range)  furosemide (LASIX) injection 40 mg (has no administration in time range)  furosemide (LASIX) injection 40 mg (40 mg Intravenous Given 10/09/17 1254)     Initial Impression / Assessment and Plan / ED Course  I have reviewed the triage vital signs and the nursing  notes.  Pertinent labs & imaging results that were available during my care of the patient were reviewed by me and considered in my medical decision making (see chart for details).  Clinical Course as of Oct 09 1353  Tue Oct 09, 2017  1334 The patient has a severely depressed potassium level which will need to be replaced.  She does not have a prolonged QT interval on her EKG.  She has a normal white blood cell count, she is chronically anemic and stable over time, she is subtherapeutic on Coumadin with an INR of 1.7, renal function is improved compared to baseline with a creatinine of 1.1 which is better than previous, BNP is on measurably elevated at over 4500 which is up from prior 3000 measurement 3 weeks ago.   [BM]  1334 Ultimately the chest x-ray shows that she has improving effusions and infiltrates however given her clinical picture of increased fluid retention shortness of breath elevated BNP and hypokalemia I feel that she will need to be admitted for diuresis and potassium replacement.  We will start with intravenous magnesium, potassium, Lasix diuresis and admission to the hospitalist.   [BM]    Clinical Course User Index [BM] Noemi Chapel, MD    Differential diagnosis would include COPD, congestive heart failure, pneumonia, aspiration  Will perform chest x-ray, laboratory workup, EKG  On my interpretation the EKG shows more of a multifocal atrial tachycardia than it does atrial fibrillation however it is irregularly irregular.  Will check an INR, chest x-ray, she likely needs some diuresis.  We will also check a C. difficile given her ongoing diarrhea though she does not have much abdominal pain or fever.  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 Difficile"  D/w Hospitalist who will admit  Mag and K given Lasix given  Final Clinical Impressions(s) / ED Diagnoses   Final diagnoses:  Acute on chronic congestive heart failure, unspecified heart  failure type ([place])  Hypokalemia      Noemi Chapel, MD 10/09/17 1356

## 2017-10-09 NOTE — ED Notes (Signed)
Ambulated pt to the restroom

## 2017-10-09 NOTE — ED Notes (Signed)
Date and time results received: 10/09/17 1329 (use smartphrase ".now" to insert current time)  Test: trop Critical Value: 0.07  Name of Provider Notified: miller Orders Received? Or Actions Taken?: none

## 2017-10-09 NOTE — Telephone Encounter (Signed)
Home Health rep states that when [facility] pt was in distress needing oxygen.  EDCM does not see evidence of recent ED admission- will follow be on alert for pt arrival.

## 2017-10-09 NOTE — ED Notes (Addendum)
Date and time results received: 10/09/17 1327 (use smartphrase ".now" to insert current time)  Test: K+  Critical Value: 2.6  Name of Provider Notified: miller  Orders Received? Or Actions Taken?:  None

## 2017-10-09 NOTE — ED Notes (Signed)
Pt returned from xray

## 2017-10-09 NOTE — Progress Notes (Addendum)
ANTICOAGULATION CONSULT NOTE - Initial Consult  Pharmacy Consult for Warfarin Indication: atrial fibrillation  Allergies  Allergen Reactions  . Codeine Anaphylaxis  . Tramadol Anaphylaxis    No hydrocodone or anything connected No hydrocodone or anything connected   . Clarithromycin Nausea And Vomiting  . Citalopram Other (See Comments)    Caused more depression   . Fish Allergy Itching    ears  . Other     Patient says that all narcotics give her anaphylaxis.   . Wellbutrin [Bupropion] Other (See Comments)    "could not speak a full sentence for a whole year"    Patient Measurements: Height: 5\' 7"  (170.2 cm) Weight: 148 lb (67.1 kg) IBW/kg (Calculated) : 61.6  Vital Signs: Temp: 98.1 F (36.7 C) (04/16 1200) Temp Source: Oral (04/16 1200) BP: 136/95 (04/16 1446) Pulse Rate: 94 (04/16 1446)  Labs: Recent Labs    10/09/17 1227 10/09/17 1232  HGB 10.0*  --   HCT 35.4*  --   PLT 285  --   LABPROT  --  20.1*  INR  --  1.73  CREATININE 1.13*  --   TROPONINI 0.07*  --     Estimated Creatinine Clearance: 49.6 mL/min (A) (by C-G formula based on SCr of 1.13 mg/dL (H)).   Medical History: Past Medical History:  Diagnosis Date  . Atrial fibrillation ([place])   . CHF (congestive heart failure) ([place])   . COPD (chronic obstructive pulmonary disease) ([place])   . Hepatitis C   . Pulmonary thrombosis ([place])   . Renal artery stenosis ([place])   . Secondary hypertension   . Stroke ([place])   . Tobacco abuse     Medications:  See med rec  Assessment: 62 y.o. female with medical history significant for prior CVA, CAD, atrial fibrillation on Coumadin, chronic grade 2 diastolic CHF, CKD stage III, and COPD who presented to the emergency department with progressively worsening shortness of breath. In addition, at [place] and office visit 10/04/2017 pt noted for a PE (thrombus in heart chamber). Pharmacy asked to dose warfarin. INR just below goal. Home dose is 5mg  daily.  Goal  of Therapy:  INR 2-3 Monitor platelets by anticoagulation protocol: Yes   Plan:  Coumadin 5mg  po daily PT-INR daily Monitor for S/S of bleeding  Isac Sarna, BS [place], BCPS Clinical Pharmacist Pager [phone] 10/09/2017,3:44 PM

## 2017-10-09 NOTE — ED Notes (Signed)
Patient transported to X-ray 

## 2017-10-10 ENCOUNTER — Encounter (HOSPITAL_COMMUNITY): Payer: Self-pay | Admitting: Family Medicine

## 2017-10-10 ENCOUNTER — Observation Stay (HOSPITAL_COMMUNITY): Payer: BLUE CROSS/BLUE SHIELD

## 2017-10-10 DIAGNOSIS — N183 Chronic kidney disease, stage 3 (moderate): Secondary | ICD-10-CM

## 2017-10-10 DIAGNOSIS — I6340 Cerebral infarction due to embolism of unspecified cerebral artery: Secondary | ICD-10-CM

## 2017-10-10 DIAGNOSIS — I5033 Acute on chronic diastolic (congestive) heart failure: Secondary | ICD-10-CM

## 2017-10-10 DIAGNOSIS — J449 Chronic obstructive pulmonary disease, unspecified: Secondary | ICD-10-CM

## 2017-10-10 DIAGNOSIS — E876 Hypokalemia: Secondary | ICD-10-CM

## 2017-10-10 DIAGNOSIS — R0600 Dyspnea, unspecified: Secondary | ICD-10-CM

## 2017-10-10 LAB — BASIC METABOLIC PANEL
Anion gap: 13 (ref 5–15)
BUN: 11 mg/dL (ref 6–20)
CHLORIDE: 103 mmol/L (ref 101–111)
CO2: 21 mmol/L — ABNORMAL LOW (ref 22–32)
Calcium: 8.4 mg/dL — ABNORMAL LOW (ref 8.9–10.3)
Creatinine, Ser: 1.13 mg/dL — ABNORMAL HIGH (ref 0.44–1.00)
GFR calc Af Amer: 59 mL/min — ABNORMAL LOW (ref >60)
GFR, EST NON AFRICAN AMERICAN: 51 mL/min — AB (ref >60)
GLUCOSE: 108 mg/dL — AB (ref 65–99)
Potassium: 4.3 mmol/L (ref 3.5–5.1)
SODIUM: 137 mmol/L (ref 135–145)

## 2017-10-10 LAB — CBC
HEMATOCRIT: 34.7 % — AB (ref 36.0–46.0)
HEMOGLOBIN: 9.7 g/dL — AB (ref 12.0–15.0)
MCH: 21.6 pg — ABNORMAL LOW (ref 26.0–34.0)
MCHC: 28 g/dL — AB (ref 30.0–36.0)
MCV: 77.3 fL — ABNORMAL LOW (ref 78.0–100.0)
Platelets: 335 10*3/uL (ref 150–400)
RBC: 4.49 MIL/uL (ref 3.87–5.11)
RDW: 17.3 % — ABNORMAL HIGH (ref 11.5–15.5)
WBC: 10.5 10*3/uL (ref 4.0–10.5)

## 2017-10-10 LAB — PROTIME-INR
INR: 2.34
Prothrombin Time: 25.4 seconds — ABNORMAL HIGH (ref 11.4–15.2)

## 2017-10-10 LAB — MAGNESIUM: MAGNESIUM: 1.9 mg/dL (ref 1.7–2.4)

## 2017-10-10 MED ORDER — POTASSIUM CHLORIDE CRYS ER 20 MEQ PO TBCR: 40.0000 meq | EXTENDED_RELEASE_TABLET | Freq: Every day | ORAL | 0 refills | Status: AC

## 2017-10-10 MED ORDER — FUROSEMIDE 40 MG PO TABS: 40.0000 mg | ORAL_TABLET | Freq: Every day | ORAL | 0 refills | Status: DC

## 2017-10-10 NOTE — Care Management Note (Signed)
Case Management Note  Patient Details  Name: Glenda Johnson MRN: [id number] Date of Birth: 1956/04/29  Subjective/Objective:    Adm with CHF. From home alone. Recent stay at [facility]. No DME. Current with a home health agency, but unsure of which agency. Previous notes say patient is planning on living with her daughter in the near future. We discuss this and patient tells me that is the plan, but "not right now", "one thing at a time". CM asked to call family. Patient declines. Sister is listed on her chart, CM left VM with sister, but patient reports she does not want CM to talk to sister but will not elaborate why. Sister, [name] left CM a VM saying that patient told her she does not want her to be her emergency contact but she does not know why.  Edwina Barth is also listed, patient refuses  to let CM call, stating he is having a surgery soon and does not want CM to bother him.  Patient is on room air during assessment. Home O2 eval has been ordered. Patient already has a DC order. She reports she will have transportation home, depending on when she is released but reports she will call for ride herself.  CM attempting to find out who is patient's Home health agency. However patient is in Observation and does not need resumption orders.       Action/Plan: DC home. Continue with home health agency. Would prefer to know more about patient and situation at home, however patient is very vague and will not allow CM to notify any family.   Expected Discharge Date:  10/10/17               Expected Discharge Plan:     In-House Referral:     Discharge planning Services     Post Acute Care Choice:    Choice offered to:     DME Arranged:    DME Agency:     HH Arranged:    HH Agency:     Status of Service:     If discussed at [employer], dates discussed:    Additional Comments:  Jauna Raczynski, Chauncey [place], RN 10/10/2017, 12:27 PM

## 2017-10-10 NOTE — Progress Notes (Signed)
SATURATION QUALIFICATIONS: (This note is used to comply with regulatory documentation for home oxygen)  Patient Saturations on Room Air at Rest = 95%  Patient Saturations on Room Air while Ambulating = 94%  Patient Saturations on N/A Liters of oxygen while Ambulating = N/A%  Please briefly explain why patient needs home oxygen:

## 2017-10-10 NOTE — Progress Notes (Signed)
ANTICOAGULATION CONSULT NOTE - follow up [place] for Warfarin Indication: atrial fibrillation  Allergies  Allergen Reactions  . Codeine Anaphylaxis  . Tramadol Anaphylaxis    No hydrocodone or anything connected No hydrocodone or anything connected   . Clarithromycin Nausea And Vomiting  . Citalopram Other (See Comments)    Caused more depression   . Fish Allergy Itching    ears  . Other     Patient says that all narcotics give her anaphylaxis.   . Wellbutrin [Bupropion] Other (See Comments)    "could not speak a full sentence for a whole year"    Patient Measurements: Height: 5\' 7"  (170.2 cm) Weight: 136 lb 3.2 oz (61.8 kg) IBW/kg (Calculated) : 61.6  Vital Signs: Temp: 97.4 F (36.3 C) (04/16 2112) Temp Source: Oral (04/16 2112) BP: 126/75 (04/17 0542) Pulse Rate: 69 (04/17 0542)  Labs: Recent Labs    10/09/17 1227 10/09/17 1232 10/10/17 0526  HGB 10.0*  --  9.7*  HCT 35.4*  --  34.7*  PLT 285  --  335  LABPROT  --  20.1* 25.4*  INR  --  1.73 2.34  CREATININE 1.13*  --  1.13*  TROPONINI 0.07*  --   --     Estimated Creatinine Clearance: 49.6 mL/min (A) (by C-G formula based on SCr of 1.13 mg/dL (H)).   Medical History: Past Medical History:  Diagnosis Date  . Atrial fibrillation ([place])   . CHF (congestive heart failure) ([place])   . COPD (chronic obstructive pulmonary disease) ([place])   . Hepatitis C   . Pulmonary thrombosis ([place])   . Renal artery stenosis ([place])   . Secondary hypertension   . Stroke ([place])   . Tobacco abuse     Medications:  See med rec  Assessment: 62 y.o. female with medical history significant for prior CVA, CAD, atrial fibrillation on Coumadin, chronic grade 2 diastolic CHF, CKD stage III, and COPD who presented to the emergency department with progressively worsening shortness of breath. In addition, at [place] and office visit 10/04/2017 pt noted for a PE (thrombus in heart chamber). Pharmacy asked to dose  warfarin. INR at goal today. Home dose is 5mg  daily.  Goal of Therapy:  INR 2-3 Monitor platelets by anticoagulation protocol: Yes   Plan:  Coumadin 5mg  po daily PT-INR daily Monitor for S/S of bleeding  Isac Sarna, BS [place], BCPS Clinical Pharmacist Pager [phone] 10/10/2017,9:10 AM

## 2017-10-10 NOTE — Discharge Summary (Signed)
Physician Discharge Summary  Glenda Johnson QVZ:[id number] DOB: 24-Sep-1955 DOA: 10/09/2017  PCP: [name], MD  Admit date: 10/09/2017 Discharge date: 10/10/2017  Admitted From: HOME  Disposition: HOME  Recommendations for Outpatient Follow-up:  1. Follow up with PCP in 1 weeks 2. PT HAS APPT TO SEE DR HAWKINS IN 1 MONTH 3. Please obtain BMP/CBC in one week 4. Please follow up on the following pending results:FINAL CULTURE DATA  Discharge Condition: STABLE   CODE STATUS: FULL    Brief Hospitalization Summary: Please see all hospital notes, images, labs for full details of the hospitalization.  HPI: Glenda Johnson is a 62 y.o. female with medical history significant for prior CVA, CAD, atrial fibrillation on Coumadin, chronic grade 2 diastolic CHF, CKD stage III, and COPD who presented to the emergency department with progressively worsening shortness of breath.  She has also had some swelling to her bilateral lower extremities and generally complains of some weakness as well.  This is despite taking her usual home Lasix as well as Coumadin.  She was recently discharged from the hospital after being admitted with respiratory distress secondary to pulmonary edema with acute on chronic diastolic heart failure.  She then went to inpatient rehabilitation and has been home this past week.  She was visited by her home health nurse who noted that she was more tachypneic and recommended further evaluation in the ED. Patient denies any cough, fever, or chills.   ED Course: Vital signs are stable.  Laboratory data remarkable for potassium 2.6.  BNP is also noted to be greater than 4500, but she is noted to have BNP elevations chronically in the 3000-4000 range.  Two-view chest x-ray compared to previous x-ray demonstrates some improvement in effusions.  INR is currently subtherapeutic at 1.7 despite Coumadin use.  EKG demonstrates some sinus tachycardia at 101 bpm.  She is currently in no acute  respiratory distress and is on room air.  She appears to have been ordered 80 mg of IV Lasix and has been ordered IV magnesium sulfate as well as IV potassium, but only 10 mEq.  1. Dyspnea secondary to acute on chronic diastolic CHF decompensation AND SEVERE EMPHYSEMA.  IMPROVED WITH diuresis with Lasix 40 mg IV twice daily.  SHE HAS DIURESED WELL AND FEELS WELL TO GO HOME.  SHE IS NOT ON SUPPLEMENTAL OXYGEN.  SHE IS AMBULATING .   2. Severe hypokalemia.  RESOLVED.  TREATED WITH ORAL AND IV REPLACEMENT. SEND HOME ON ORAL LASIX 40 MEQ DAILY.  3. Prior CVA.  Continue Coumadin with pharmacy to monitor. 4. Elevated troponin.  Patient denies any chest pain and I do not feel she would benefit from any repeat monitoring at this time. 5. History of COPD.  Currently does not have any acute bronchospasms but will order breathing treatments as needed.  She is actually scheduled to see Dr. Luan Pulling outpatient in 1 [place] TO THAT APPT.  6. CKD stage III.  Currently at baseline or better.  STABLE.   DVT prophylaxis: Continue Coumadin Code Status: Full Family Communication: None at bedside Disposition Plan: HOME Consults called: None  Discharge Diagnoses:  Principal Problem:   Congestive heart failure (HCC) Active Problems:   Elevated troponin   COPD (chronic obstructive pulmonary disease) ([place])   Cerebrovascular accident (CVA) due to embolism of cerebral artery (HCC)   Cardiac mass   CKD (chronic kidney disease) stage 3, GFR 30-59 ml/min (HCC)   Hypokalemia   Dyspnea  CHF (congestive heart failure) [facility])  Discharge Instructions: Discharge Instructions    (HEART FAILURE PATIENTS) Call MD:  Anytime you have any of the following symptoms: 1) 3 pound weight gain in 24 hours or 5 pounds in 1 week 2) shortness of breath, with or without a dry hacking cough 3) swelling in the hands, feet or stomach 4) if you have to sleep on extra pillows at night in order to breathe.    Complete by:  As directed    Call MD for:  difficulty breathing, headache or visual disturbances   Complete by:  As directed    Call MD for:  extreme fatigue   Complete by:  As directed    Call MD for:  persistant dizziness or light-headedness   Complete by:  As directed    Increase activity slowly   Complete by:  As directed      Allergies as of 10/10/2017      Reactions   Codeine Anaphylaxis   Tramadol Anaphylaxis   No hydrocodone or anything connected No hydrocodone or anything connected   Clarithromycin Nausea And Vomiting   Citalopram Other (See Comments)   Caused more depression    Fish Allergy Itching   ears   Other    Patient says that all narcotics give her anaphylaxis.    Wellbutrin [bupropion] Other (See Comments)   "could not speak a full sentence for a whole year"      Medication List    TAKE these medications   albuterol 108 (90 Base) MCG/ACT inhaler Commonly known as:  PROVENTIL HFA;VENTOLIN HFA Inhale 2 puffs into the lungs every 4 (four) hours as needed.   carvedilol 12.5 MG tablet Commonly known as:  COREG Take 1 tablet (12.5 mg total) by mouth 2 (two) times daily with a meal.   furosemide 40 MG tablet Commonly known as:  LASIX Take 1 tablet (40 mg total) by mouth daily.   LORazepam 0.5 MG tablet Commonly known as:  ATIVAN Take 0.5 mg by mouth every 8 (eight) hours.   nitroGLYCERIN 0.4 MG SL tablet Commonly known as:  NITROSTAT Place 0.4 mg under the tongue every 5 (five) minutes as needed for chest pain.   pantoprazole 40 MG tablet Commonly known as:  PROTONIX Take 1 tablet (40 mg total) by mouth daily at 12 noon.   potassium chloride SA 20 MEQ tablet Commonly known as:  K-DUR,KLOR-CON Take 2 tablets (40 mEq total) by mouth daily.   warfarin 5 MG tablet Commonly known as:  COUMADIN Take 1 tablet (5 mg total) by mouth daily. What changed:    how much to take  additional instructions      Follow-up Information    [name],  MD. Schedule an appointment as soon as possible for a visit in 1 week(s).   Specialty:  Family Medicine Why:  HOSPITAL FOLLOW UP  Contact information: [address] [place] [address] [phone]        Sinda Du, MD. Go in 1 month(s).   Specialty:  Pulmonary Disease Why:  AS ALREADY SCHEDULED Contact information: [address] [place] [address] [phone]          Allergies  Allergen Reactions  . Codeine Anaphylaxis  . Tramadol Anaphylaxis    No hydrocodone or anything connected No hydrocodone or anything connected   . Clarithromycin Nausea And Vomiting  . Citalopram Other (See Comments)    Caused more depression   . Fish Allergy Itching  ears  . Other     Patient says that all narcotics give her anaphylaxis.   . Wellbutrin [Bupropion] Other (See Comments)    "could not speak a full sentence for a whole year"   Allergies as of 10/10/2017      Reactions   Codeine Anaphylaxis   Tramadol Anaphylaxis   No hydrocodone or anything connected No hydrocodone or anything connected   Clarithromycin Nausea And Vomiting   Citalopram Other (See Comments)   Caused more depression    Fish Allergy Itching   ears   Other    Patient says that all narcotics give her anaphylaxis.    Wellbutrin [bupropion] Other (See Comments)   "could not speak a full sentence for a whole year"      Medication List    TAKE these medications   albuterol 108 (90 Base) MCG/ACT inhaler Commonly known as:  PROVENTIL HFA;VENTOLIN HFA Inhale 2 puffs into the lungs every 4 (four) hours as needed.   carvedilol 12.5 MG tablet Commonly known as:  COREG Take 1 tablet (12.5 mg total) by mouth 2 (two) times daily with a meal.   furosemide 40 MG tablet Commonly known as:  LASIX Take 1 tablet (40 mg total) by mouth daily.   LORazepam 0.5 MG tablet Commonly known as:  ATIVAN Take 0.5 mg by mouth every 8 (eight) hours.   nitroGLYCERIN 0.4 MG SL  tablet Commonly known as:  NITROSTAT Place 0.4 mg under the tongue every 5 (five) minutes as needed for chest pain.   pantoprazole 40 MG tablet Commonly known as:  PROTONIX Take 1 tablet (40 mg total) by mouth daily at 12 noon.   potassium chloride SA 20 MEQ tablet Commonly known as:  K-DUR,KLOR-CON Take 2 tablets (40 mEq total) by mouth daily.   warfarin 5 MG tablet Commonly known as:  COUMADIN Take 1 tablet (5 mg total) by mouth daily. What changed:    how much to take  additional instructions       Procedures/Studies: Dg Chest 2 View  Result Date: 10/09/2017 CLINICAL DATA:  Short of breath EXAM: CHEST - 2 VIEW COMPARISON:  09/16/2017 FINDINGS: Extensive bibasilar airspace disease with mild interval improvement. Bilateral pleural effusions also have improved. Underlying chronic lung disease. IMPRESSION: Interval improvement in bibasilar infiltrate and effusion. Electronically Signed   By: Franchot Gallo M.D.   On: 10/09/2017 13:09   Dg Abd 1 View  Result Date: 09/24/2017 CLINICAL DATA:  Epigastric pain x3 days.  Constipation. EXAM: ABDOMEN - 1 VIEW COMPARISON:  Chest CT from 09/20/2017. FINDINGS: Moderate stool burden within the right colon. No bowel obstruction or free air. Small right effusion with patchy bibasilar airspace disease. No radio-opaque calculi or other significant radiographic abnormality are seen. IMPRESSION: Moderate stool burden within the right colon.  No bowel obstruction. Electronically Signed   By: Ashley Royalty M.D.   On: 09/24/2017 03:21   Ct Head Wo Contrast  Result Date: 09/20/2017 CLINICAL DATA:  62 y/o  F; numbness, tingling, and paresthesias. EXAM: CT HEAD WITHOUT CONTRAST TECHNIQUE: Contiguous axial images were obtained from the base of the skull through the vertex without intravenous contrast. COMPARISON:  09/19/2017 MRI of the head. FINDINGS: Brain: Small focus of infarction within the right occipitotemporal junction and multiple small foci of  infarction throughout the cerebral hemispheres and left cerebellum are stable in comparison with the prior MRI given differences in technique. No significant associated mass effect. No hemorrhage identified. No new acute intracranial abnormality. No  effacement of basilar cisterns, hydrocephalus, or extra-axial collection. Vascular: No hyperdense vessel or unexpected calcification. Skull: Normal. Negative for fracture or focal lesion. Sinuses/Orbits: No acute finding. Other: None. IMPRESSION: 1. Right occipitotemporal junction infarction as well as multiple additional small infarctions throughout the cerebral hemispheres and left cerebellum are stable in comparison with the prior MRI given differences in technique. No associated hemorrhage or mass effect. 2. No new acute intracranial abnormality identified. Electronically Signed   By: Kristine Garbe M.D.   On: 09/20/2017 01:04   Ct Chest Without Contrast  Result Date: 09/20/2017 CLINICAL DATA:  62 year old female with shortness of breath. History of atrial fibrillation. EXAM: CT CHEST WITHOUT CONTRAST TECHNIQUE: Multidetector CT imaging of the chest was performed following the standard protocol without IV contrast. COMPARISON:  No priors. FINDINGS: Cardiovascular: Heart size is borderline enlarged. Small amount of pericardial fluid and/or thickening, unlikely to be of hemodynamic significance at this time, most evident underlying the atrioventricular groove and in superior pericardial recesses. No associated pericardial calcification. Aortic atherosclerosis. No definite coronary artery calcifications are identified. Mediastinum/Nodes: Multiple prominent borderline enlarged mediastinal and hilar lymph nodes are noted, nonspecific. Esophagus is unremarkable in appearance. No axillary lymphadenopathy. Lungs/Pleura: Small right and trace left pleural effusions lying dependently. Areas of dependent subsegmental atelectasis in the right lower lobe. Patchy  areas of predominantly ground-glass attenuation consolidation in the lung bases bilaterally, with somewhat of a peripheral mass-like appearance in the left lower lobe where there is more profound peripheral density with central areas of ground-glass attenuation and septal thickening, best appreciated on axial image 125 of series 7 and sagittal image 76 of series 6 where the largest lesion measures 3.7 x 2.2 x 3.8 cm. Diffuse bronchial wall thickening with mild to moderate centrilobular and paraseptal emphysema. Upper Abdomen: Trace amount of perihepatic ascites. Soft tissue stranding throughout the visualized retroperitoneum. Fluid attenuation in the region of the porta hepatis, suggesting edema. Musculoskeletal: Densely calcified bilateral breast implants. There are no aggressive appearing lytic or blastic lesions noted in the visualized portions of the skeleton. IMPRESSION: 1. Multifocal opacities in the lung bases bilaterally which have an unusual appearance. These are favored to reflect potential areas of cryptogenic organizing pneumonia (COP) based on the appearance of the largest lesion in the posterior left lower lobe which has a suggestion of the so-called "atoll sign". However, the overall appearance is nonspecific. Differential considerations include atypical infectious etiologies, areas of alveolar hemorrhage in the setting of multifocal pulmonary infarction, and even neoplasm such as adenocarcinoma. At this time, repeat noncontrast chest CT is suggested in 3-6 weeks to re-evaluate these findings. If there is any clinical concern for acute pulmonary embolus at this time, further evaluation with PE protocol CT would be recommended. 2. Small right and trace left pleural effusions lying dependently. 3. Small amount of pericardial fluid and/or thickening, unlikely to be of hemodynamic significance at this time. 4. Mild diffuse bronchial wall thickening and mild to moderate centrilobular and paraseptal  emphysema; imaging findings compatible with the clinical history of COPD. 5. Aortic atherosclerosis. These results were called by telephone at the time of interpretation on 09/20/2017 at 5:42 pm to Dr. Ivin Poot, who verbally acknowledged these results. Aortic Atherosclerosis (ICD10-I70.0) and Emphysema (ICD10-J43.9). Electronically Signed   By: Vinnie Langton M.D.   On: 09/20/2017 17:43   Mr Brain Wo Contrast  Result Date: 09/19/2017 CLINICAL DATA:  Stroke EXAM: MRI HEAD WITHOUT CONTRAST TECHNIQUE: Multiplanar, multiecho pulse sequences of the brain and surrounding structures were obtained  without intravenous contrast. COMPARISON:  None. FINDINGS: Brain: Multiple acute infarcts are present bilaterally. Largest infarct in the right occipital lobe. Numerous small cortical and subcortical infarcts in both cerebral hemispheres. Small acute infarcts left cerebellum. Negative for hemorrhage or mass. Ventricle size normal. No midline shift. Minimal if any chronic ischemia. Vascular: Normal arterial flow voids Skull and upper cervical spine: Negative Sinuses/Orbits: Negative Other: None IMPRESSION: Multiple acute infarcts bilaterally consistent with cerebral emboli. No midline shift or hemorrhage. Electronically Signed   By: Franchot Gallo M.D.   On: 09/19/2017 11:40   US Abdomen Complete  Result Date: 09/16/2017 CLINICAL DATA:  Upper abdominal pain. EXAM: ABDOMEN ULTRASOUND COMPLETE COMPARISON:  None. FINDINGS: Gallbladder: Partially distended with diffuse gallbladder wall thickening measuring up to 9 mm. No gallstones or sludge. No pericholecystic fluid. No sonographic Revolorio sign noted by sonographer. Common bile duct: Diameter: 5 mm. Liver: No focal lesion identified. Diffusely increased in parenchymal echogenicity. Portal vein is patent on color Doppler imaging with normal direction of blood flow towards the liver. IVC: No abnormality visualized. Pancreas: Majority obscured by bowel gas. Spleen: Size  and appearance within normal limits. Right Kidney: Length: 8.0 cm. Thinning of the renal parenchyma. Questionable increased echogenicity. No mass or hydronephrosis visualized. Left Kidney: Length: 10.6 cm. Echogenicity within normal limits. No mass or hydronephrosis visualized. Abdominal aorta: No aneurysm visualized. Atherosclerotic calcifications noted. Other findings: Bilateral pleural effusions. IMPRESSION: 1. Diffuse gallbladder wall thickening of 9 mm without gallstones or pericholecystic more phi sign. Findings favor systemic causes such as passive congestion or secondary to hepatic causes over primary gallbladder inflammation. No biliary dilatation. 2. Increased hepatic echogenicity suggests steatosis. 3. Small right kidney. 4. Bilateral pleural effusions. Electronically Signed   By: Jeb Levering M.D.   On: 09/16/2017 00:49   Dg Chest Port 1 View  Result Date: 10/10/2017 CLINICAL DATA:  Acute shortness of breath. EXAM: PORTABLE CHEST 1 VIEW COMPARISON:  10/09/2017.  09/16/2017. FINDINGS: Allowing for different technical factors, the appearance is similar with persistent bilateral effusions and lower lobe atelectasis and or pneumonia. Upper lungs remain largely clear. Probable interstitial edema. IMPRESSION: Similar appearance allowing for different technical factors. Bilateral effusions with lower lobe atelectasis/infiltrate and interstitial edema. Electronically Signed   By: Nelson Chimes M.D.   On: 10/10/2017 09:00   Dg Chest Port 1 View  Result Date: 09/16/2017 CLINICAL DATA:  Pulmonary edema EXAM: PORTABLE CHEST 1 VIEW COMPARISON:  Portable exam 0421 hours compared to 09/15/2017 FINDINGS: Upper normal heart size. Stable mediastinal contours. Diffuse pulmonary infiltrates in the mid to lower lungs bilaterally, could represent pulmonary edema or multifocal infection. No gross pleural effusion or pneumothorax. Bones unremarkable. IMPRESSION: Persistent BILATERAL pulmonary infiltrates.  Electronically Signed   By: Lavonia Dana M.D.   On: 09/16/2017 07:49   Dg Chest Port 1 View  Result Date: 09/15/2017 CLINICAL DATA:  Shortness of breath. EXAM: PORTABLE CHEST 1 VIEW COMPARISON:  May 23, 2016 FINDINGS: Infiltrates are seen in the lung bases, similar in appearance to more remote studies. The heart size borderline. The hila are normal. No pneumothorax. No nodules or masses. IMPRESSION: Bibasilar infiltrates could represent developing ARDS versus pneumonia. Recommend clinical correlation. Recommend follow-up to resolution. Electronically Signed   By: Dorise Bullion III M.D   On: 09/15/2017 19:57   Mr Cardiac Morphology Wo Contrast  Result Date: 09/19/2017 CLINICAL DATA:  62 year old female with CHF and finding of a left ventricular mass on the echocardiogram. EXAM: CARDIAC MRI TECHNIQUE: The patient was scanned on  a 1.5 Tesla GE magnet. A dedicated cardiac coil was used. Functional imaging was done using Fiesta sequences. 2,3, and 4 chamber views were done to assess for RWMA's. Modified Simpson's rule using a short axis stack was used to calculate an ejection fraction on a dedicated work Conservation officer, nature. The patient didn't received contrast. After 10 minutes inversion recovery sequences were used to assess for infiltration and scar tissue. CONTRAST:  None.  GFR 30 ml/min FINDINGS: 1. Normal left ventricular size, thickness and systolic function (LVEF = 55-60%). There are no regional wall motion abnormalities. There is a large pedunculated mobile mass located at the left ventricular apex attached to the apical septal wall. The mass measures 26 x 23 x 16 mm. 2. Normal right ventricular size, thickness and systolic function (LVEF =). There are no regional wall motion abnormalities. 3. Normal left atrial size, mildly dilated right atrium. 4. Normal size of the aortic root, ascending aorta and pulmonary artery. 5. Trivial mitral and mild to moderate tricuspid regurgitation. 6.  Normal pericardium. Trivial, inferiorly located pericardial effusion. IMPRESSION: 1. Normal left ventricular size, thickness and systolic function (LVEF = 55-60%). There are no regional wall motion abnormalities. There is a large pedunculated mobile mass located at the left ventricular apex attached to the apical septal wall. The mass measures 26 x 23 x 16 mm. There are no wall motion abnormalities in the left ventricular segments around the mass. The patient didn't receive gadolinium contrast that would help with tissue characterization. T1 with and without fat sat suggest no or low fat content. This could represent a myxoma, a thrombus can't be excluded. Additional early and late gadolinium enhancement images are recommended once GFR improves > 30 ml/min. Electronically Signed   By: Ena Dawley   On: 09/19/2017 12:45      Subjective: PT SAYS SHE IS FEELING A LOT BETTER, SHE IS A LOT LESS SOB AND HAS URINATED A LOT SINCE ADMISSION.  RN HAS NOT BEEN RECORDING OUTPUT.    Discharge Exam: Vitals:   10/09/17 2112 10/10/17 0542  BP: 138/70 126/75  Pulse: 62 69  Resp: 16   Temp: (!) 97.4 F (36.3 C)   SpO2: 94% 100%   Vitals:   10/09/17 1656 10/09/17 1821 10/09/17 2112 10/10/17 0542  BP:   138/70 126/75  Pulse:   62 69  Resp:   16   Temp:   (!) 97.4 F (36.3 C)   TempSrc:   Oral   SpO2: 99%  94% 100%  Weight:  61.8 kg (136 lb 3.2 oz)    Height:        General: Pt is alert, awake, not in acute distress Cardiovascular: RRR, S1/S2 +, no rubs, no gallops Respiratory: CTA bilaterally, no wheezing, no rhonchi Abdominal: Soft, NT, ND, bowel sounds + Extremities: no edema, no cyanosis   The results of significant diagnostics from this hospitalization (including imaging, microbiology, ancillary and laboratory) are listed below for reference.     Microbiology: No results found for this or any previous visit (from the past 240 hour(s)).   Labs: BNP (last 3 results) Recent Labs     09/16/17 0445 09/17/17 0237 10/09/17 1232  BNP >4,500.0* 3,035.3* >8,099.8*   Basic Metabolic Panel: Recent Labs  Lab 10/09/17 1227 10/10/17 0526  NA 140 137  K 2.6* 4.3  CL 100* 103  CO2 27 21*  GLUCOSE 104* 108*  BUN 12 11  CREATININE 1.13* 1.13*  CALCIUM 8.2* 8.4*  MG  --  1.9   Liver Function Tests: No results for input(s): AST, ALT, ALKPHOS, BILITOT, PROT, ALBUMIN in the last 168 hours. No results for input(s): LIPASE, AMYLASE in the last 168 hours. No results for input(s): AMMONIA in the last 168 hours. CBC: Recent Labs  Lab 10/09/17 1227 10/10/17 0526  WBC 10.4 10.5  NEUTROABS 6.6  --   HGB 10.0* 9.7*  HCT 35.4* 34.7*  MCV 77.3* 77.3*  PLT 285 335   Cardiac Enzymes: Recent Labs  Lab 10/09/17 1227  TROPONINI 0.07*   BNP: Invalid input(s): POCBNP CBG: No results for input(s): GLUCAP in the last 168 hours. D-Dimer No results for input(s): DDIMER in the last 72 hours. Hgb A1c No results for input(s): HGBA1C in the last 72 hours. Lipid Profile No results for input(s): CHOL, HDL, LDLCALC, TRIG, CHOLHDL, LDLDIRECT in the last 72 hours. Thyroid function studies No results for input(s): TSH, T4TOTAL, T3FREE, THYROIDAB in the last 72 hours.  Invalid input(s): FREET3 Anemia work up No results for input(s): VITAMINB12, FOLATE, FERRITIN, TIBC, IRON, RETICCTPCT in the last 72 hours. Urinalysis    Component Value Date/Time   COLORURINE STRAW (A) 10/09/2017 1325   APPEARANCEUR CLEAR 10/09/2017 1325   LABSPEC 1.004 (L) 10/09/2017 1325   PHURINE 8.0 10/09/2017 1325   GLUCOSEU NEGATIVE 10/09/2017 1325   HGBUR NEGATIVE 10/09/2017 1325   BILIRUBINUR NEGATIVE 10/09/2017 1325   KETONESUR NEGATIVE 10/09/2017 1325   PROTEINUR NEGATIVE 10/09/2017 1325   NITRITE NEGATIVE 10/09/2017 1325   LEUKOCYTESUR NEGATIVE 10/09/2017 1325   Sepsis Labs Invalid input(s): PROCALCITONIN,  WBC,  LACTICIDVEN Microbiology No results found for this or any previous visit (from the  past 240 hour(s)).  Time coordinating discharge:   SIGNED:  Irwin Brakeman, MD  [facility] 10/10/2017, 11:22 AM Pager [phone]  If 7PM-7AM, please contact night-coverage [email or web address] Password [email or web address]

## 2017-10-10 NOTE — Plan of Care (Signed)
progressing 

## 2017-10-10 NOTE — Progress Notes (Signed)
Removed IV-clean, dry, intact.Reviwed d/c paperwork with patient. Reviewed new medications and where to pick up. Glenda Johnson wheeled stable pt to main entrance where she was picked up by her friend.

## 2017-10-10 NOTE — Clinical Social Work Note (Signed)
Received CSW consult for [place] and DME needs. Notified RN CM as this is a function that they manage. Will clear the CSW consult. LCSW will be available if pt has SW needs prior to dc.

## 2017-10-10 NOTE — Discharge Instructions (Signed)
Follow with Primary MD  [name], MD  and other consultant's as instructed your Hospitalist MD  Please get a complete blood count and chemistry panel checked by your Primary MD at your next visit, and again as instructed by your Primary MD.  Get Medicines reviewed and adjusted: Please take all your medications with you for your next visit with your Primary MD  Laboratory/radiological data: Please request your Primary MD to go over all hospital tests and procedure/radiological results at the follow up, please ask your Primary MD to get all Hospital records sent to his/her office.  In some cases, they will be blood work, cultures and biopsy results pending at the time of your discharge. Please request that your primary care M.D. follows up on these results.  Also Note the following: If you experience worsening of your admission symptoms, develop shortness of breath, life threatening emergency, suicidal or homicidal thoughts you must seek medical attention immediately by calling 911 or calling your MD immediately  if symptoms less severe.  You must read complete instructions/literature along with all the possible adverse reactions/side effects for all the Medicines you take and that have been prescribed to you. Take any new Medicines after you have completely understood and accpet all the possible adverse reactions/side effects.   Do not drive when taking Pain medications or sleeping medications (Benzodaizepines)  Do not take more than prescribed Pain, Sleep and Anxiety Medications. It is not advisable to combine anxiety,sleep and pain medications without talking with your primary care practitioner  Special Instructions: If you have smoked or chewed Tobacco  in the last 2 yrs please stop smoking, stop any regular Alcohol  and or any Recreational drug use.  Wear Seat belts while driving.  Please note: You were cared for by a hospitalist during your hospital stay. Once you are discharged,  your primary care physician will handle any further medical issues. Please note that NO REFILLS for any discharge medications will be authorized once you are discharged, as it is imperative that you return to your primary care physician (or establish a relationship with a primary care physician if you do not have one) for your post hospital discharge needs so that they can reassess your need for medications and monitor your lab values.     Heart Failure Eating Plan Heart failure, also called congestive heart failure, occurs when your heart does not pump blood well enough to meet your body's needs for oxygen-rich blood. Heart failure is a long-term (chronic) condition. Living with heart failure can be challenging. However, following your health care provider's instructions about a healthy lifestyle and working with a diet and nutrition specialist (dietitian) to choose the right foods may help to improve your symptoms. What are tips for following this plan? General guidelines  Do not eat more than 2,300 mg of salt (sodium) a day. The amount of sodium that is recommended for you may be lower, depending on your condition.  Maintain a healthy body weight as directed. Ask your health care provider what a healthy weight is for you. ? Check your weight every day. ? Work with your health care provider and dietitian to make a plan that is right for you to lose weight or maintain your current weight.  Limit how much fluid you drink. Ask your health care provider or dietitian how much fluid you can have each day.  Limit or avoid alcohol as told by your health care provider or dietitian. Reading food labels  Check food labels  for the amount of sodium per serving. Choose foods that have less than 140 mg (milligrams) of sodium in each serving.  Check food labels for the number of calories per serving. This is important if you need to limit your daily calorie intake to lose weight.  Check food labels for  the serving size. If you eat more than one serving, you will be eating more sodium and calories than what is listed on the label.  Look for foods that are labeled as "sodium-free," "very low sodium," or "low sodium." ? Foods labeled as "reduced sodium" or "lightly salted" may still have more sodium than what is recommended for you. Cooking  Avoid adding salt when cooking. Ask your health care provider or dietitian before using salt substitutes.  Season food with salt-free seasonings, spices, or herbs. Check the label of seasoning mixes to make sure they do not contain salt.  Cook with heart-healthy oils, such as olive, canola, soybean, or sunflower oil.  Do not fry foods. Cook foods using low-fat methods, such as baking, boiling, grilling, and broiling.  Limit unhealthy fats when cooking by: ? Removing the skin from poultry, such as chicken. ? Removing all visible fats from meats. ? Skimming the fat off from stews, soups, and gravies before serving them. Meal planning  Limit your intake of: ? Processed, canned, or pre-packaged foods. ? Foods that are high in trans fat, such as fried foods. ? Sweets, desserts, sugary drinks, and other foods with added sugar. ? Full-fat dairy products, such as whole milk.  Eat a balanced diet that includes: ? 4-5 servings of fruit each day and 4-5 servings of vegetables each day. At each meal, try to fill half of your plate with fruits and vegetables. ? Up to 6-8 servings of whole grains each day. ? Up to 2 servings of lean meat, poultry, or fish each day. One serving of meat is equal to 3 oz. This is about the same size as a deck of cards. ? 2 servings of low-fat dairy each day. ? Heart-healthy fats. Healthy fats called omega-3 fatty acids are found in foods such as flaxseed and cold-water fish like sardines, salmon, and mackerel.  Aim to eat 25-35 g (grams) of fiber a day. Foods that are high in fiber include apples, broccoli, carrots, beans, peas,  and whole grains.  Do not add salt or condiments that contain salt (such as soy sauce) to foods before eating.  When eating at a restaurant, ask that your food be prepared with less salt or no salt, if possible.  Try to eat 2 or more vegetarian meals each week.  Eat more home-cooked food and eat less restaurant, buffet, and fast food. Recommended foods The items listed may not be a complete list. Talk with your dietitian about what dietary choices are best for you. Grains Bread with less than 80 mg of sodium per slice. Whole-wheat pasta, quinoa, and brown rice. Oats and oatmeal. Barley. [place]. Grits and cream of wheat. Whole-grain and whole-wheat cold cereal. Vegetables All fresh vegetables. Vegetables that are frozen without sauce or added salt. Low-sodium or sodium-free canned vegetables. Fruits All fresh, frozen, and canned fruits. Dried fruits, such as raisins, prunes, and cranberries. Meats and other protein foods Lean cuts of meat. Skinless chicken and [place]. Fish with high omega-3 fatty acids, such as salmon, sardines, and other cold-water fishes. Eggs. Dried beans, peas, and edamame. Unsalted nuts and nut butters. Dairy Low-fat or nonfat (skim) milk and dried milk. Rice milk,  soy milk, and almond milk. Low-fat or nonfat yogurt. Small amounts of reduced-sodium block cheese. Low-sodium cottage cheese. Fats and oils Olive, canola, soybean, flaxseed, or sunflower oil. Avocado. Sweets and desserts Apple sauce. Granola bars. Sugar-free pudding and gelatin. Frozen fruit bars. Seasoning and other foods Fresh and dried herbs. Lemon or lime juice. Vinegar. Low-sodium ketchup. Salt-free marinades, salad dressings, sauces, and seasonings. Foods to avoid The items listed may not be a complete list. Talk with your dietitian about what dietary choices are best for you. Grains Bread with more than 80 mg of sodium per slice. Hot or cold cereal with more than 140 mg sodium per serving. Salted  pretzels and crackers. Pre-packaged breadcrumbs. Bagels, croissants, and biscuits. Vegetables Canned vegetables. Frozen vegetables with sauce or seasonings. Creamed vegetables. [place] fries. Onion rings. Pickled vegetables and sauerkraut. Fruits Fruits that are dried with sodium-containing preservatives. Meats and other protein foods Ribs and chicken wings. Bacon, ham, pepperoni, bologna, salami, and packaged luncheon meats. Hot dogs, bratwurst, and sausage. Canned meat. Smoked meat and fish. Salted nuts and seeds. Dairy Whole milk, half-and-half, and cream. Buttermilk. Processed cheese, cheese spreads, and cheese curds. Regular cottage cheese. Feta cheese. Shredded cheese. String cheese. Fats and oils Butter, lard, shortening, ghee, and bacon fat. Canned and packaged gravies. Seasoning and other foods Onion salt, garlic salt, table salt, and sea salt. Marinades. Regular salad dressings. Relishes, pickles, and olives. Meat flavorings and tenderizers, and bouillon cubes. Horseradish, ketchup, and mustard. Worcestershire sauce. Teriyaki sauce, soy sauce (including reduced sodium). Hot sauce and Tabasco sauce. Steak sauce, fish sauce, oyster sauce, and cocktail sauce. Taco seasonings. Barbecue sauce. Tartar sauce. Summary  A heart failure eating plan includes changes that limit your intake of sodium and unhealthy fat, and it may help you lose weight or maintain a healthy weight. Your health care provider may also recommend limiting how much fluid you drink.  Most people with heart failure should eat no more than 2,300 mg of salt (sodium) a day. The amount of sodium that is recommended for you may be lower, depending on your condition.  Contact your health care provider or dietitian before making any major changes to your diet. This information is not intended to replace advice given to you by your health care provider. Make sure you discuss any questions you have with your health care  provider. Document Released: 10/27/2016 Document Revised: 10/27/2016 Document Reviewed: 10/27/2016 Elsevier Interactive Patient Education  2018 [place].   Heart Failure Action Plan A heart failure action plan helps you understand what to do when you have symptoms of heart failure. Follow the plan that was created by you and your health care provider. Review your plan each time you visit your health care provider. Red zone These signs and symptoms mean you should get medical help right away:  You have trouble breathing when resting.  You have a dry cough that is getting worse.  You have swelling or pain in your legs or abdomen that is getting worse.  You suddenly gain more than 2-3 lb (0.9-1.4 kg) in a day, or more than 5 lb (2.3 kg) in one week. This amount may be more or less depending on your condition.  You have trouble staying awake or you feel confused.  You have chest pain.  You do not have an appetite.  You pass out.  If you experience any of these symptoms:  Call your local emergency services (911 in the [place] right away or seek help at the  emergency department of the nearest hospital.  Yellow zone These signs and symptoms mean your condition may be getting worse and you should make some changes:  You have trouble breathing when you are active or you need to sleep with extra pillows.  You have swelling in your legs or abdomen.  You gain 2-3 lb (0.9-1.4 kg) in one day, or 5 lb (2.3 kg) in one week. This amount may be more or less depending on your condition.  You get tired easily.  You have trouble sleeping.  You have a dry cough.  If you experience any of these symptoms:  Contact your health care provider within the next day.  Your health care provider may adjust your medicines.  Green zone These signs mean you are doing well and can continue what you are doing:  You do not have shortness of breath.  You have very little swelling or no new  swelling.  Your weight is stable (no gain or loss).  You have a normal activity level.  You do not have chest pain or any other new symptoms.  Follow these instructions at home:  Take over-the-counter and prescription medicines only as told by your health care provider.  Weigh yourself daily. Your target weight is __________ lb (__________ kg). ? Call your health care provider if you gain more than __________ lb (__________ kg) in a day, or more than __________ lb (__________ kg) in one week.  Eat a heart-healthy diet. Work with a diet and nutrition specialist (dietitian) to create an eating plan that is best for you.  Keep all follow-up visits as told by your health care provider. This is important. Where to find more information:  American Heart Association: www.heart.org Summary  Follow the action plan that was created by you and your health care provider.  Get help right away if you have any symptoms in the Red zone. This information is not intended to replace advice given to you by your health care provider. Make sure you discuss any questions you have with your health care provider. Document Released: 07/22/2016 Document Revised: 07/22/2016 Document Reviewed: 07/22/2016 Elsevier Interactive Patient Education  2018 [place].   Heart Failure Heart failure means your heart has trouble pumping blood. This makes it hard for your body to work well. Heart failure is usually a long-term (chronic) condition. You must take good care of yourself and follow your doctor's treatment plan. Follow these instructions at home:  Take your heart medicine as told by your doctor. ? Do not stop taking medicine unless your doctor tells you to. ? Do not skip any dose of medicine. ? Refill your medicines before they run out. ? Take other medicines only as told by your doctor or pharmacist.  Stay active if told by your doctor. The elderly and people with severe heart failure should talk with  a doctor about physical activity.  Eat heart-healthy foods. Choose foods that are without trans fat and are low in saturated fat, cholesterol, and salt (sodium). This includes fresh or frozen fruits and vegetables, fish, lean meats, fat-free or low-fat dairy foods, whole grains, and high-fiber foods. Lentils and dried peas and beans (legumes) are also good choices.  Limit salt if told by your doctor.  Cook in a healthy way. Roast, grill, broil, bake, poach, steam, or stir-fry foods.  Limit fluids as told by your doctor.  Weigh yourself every morning. Do this after you pee (urinate) and before you eat breakfast. Write down your weight  to give to your doctor.  Take your blood pressure and write it down if your doctor tells you to.  Ask your doctor how to check your pulse. Check your pulse as told.  Lose weight if told by your doctor.  Stop smoking or chewing tobacco. Do not use gum or patches that help you quit without your doctor's approval.  Schedule and go to doctor visits as told.  Nonpregnant women should have no more than 1 drink a day. Men should have no more than 2 drinks a day. Talk to your doctor about drinking alcohol.  Stop illegal drug use.  Stay current with shots (immunizations).  Manage your health conditions as told by your doctor.  Learn to manage your stress.  Rest when you are tired.  If it is really hot outside: ? Avoid intense activities. ? Use air conditioning or fans, or get in a cooler place. ? Avoid caffeine and alcohol. ? Wear loose-fitting, lightweight, and light-colored clothing.  If it is really cold outside: ? Avoid intense activities. ? Layer your clothing. ? Wear mittens or gloves, a hat, and a scarf when going outside. ? Avoid alcohol.  Learn about heart failure and get support as needed.  Get help to maintain or improve your quality of life and your ability to care for yourself as needed. Contact a doctor if:  You gain weight  quickly.  You are more short of breath than usual.  You cannot do your normal activities.  You tire easily.  You cough more than normal, especially with activity.  You have any or more puffiness (swelling) in areas such as your hands, feet, ankles, or belly (abdomen).  You cannot sleep because it is hard to breathe.  You feel like your heart is beating fast (palpitations).  You get dizzy or light-headed when you stand up. Get help right away if:  You have trouble breathing.  There is a change in mental status, such as becoming less alert or not being able to focus.  You have chest pain or discomfort.  You faint. This information is not intended to replace advice given to you by your health care provider. Make sure you discuss any questions you have with your health care provider. Document Released: 03/21/2008 Document Revised: 11/18/2015 Document Reviewed: 07/29/2012 Elsevier Interactive Patient Education  2017 [place] of Breath, Adult Shortness of breath means you have trouble breathing. Your lungs are organs for breathing. Follow these instructions at home: Pay attention to any changes in your symptoms. Take these actions to help with your condition:  Do not smoke. Smoking can cause shortness of breath. If you need help to quit smoking, ask your doctor.  Avoid things that can make it harder to breathe, such as: ? Mold. ? Dust. ? Air pollution. ? Chemical smells. ? Things that can cause allergy symptoms (allergens), if you have allergies.  Keep your living space clean and free of mold and dust.  Rest as needed. Slowly return to your usual activities.  Take over-the-counter and prescription medicines, including oxygen and inhaled medicines, only as told by your doctor.  Keep all follow-up visits as told by your doctor. This is important.  Contact a doctor if:  Your condition does not get better as soon as expected.  You have a hard time  doing your normal activities, even after you rest.  You have new symptoms. Get help right away if:  You have trouble breathing when you are resting.  You feel light-headed or you faint.  You have a cough that is not helped by medicines.  You cough up blood.  You have pain with breathing.  You have pain in your chest, arms, shoulders, or belly (abdomen).  You have a fever.  You cannot walk up stairs.  You cannot exercise the way you normally do. This information is not intended to replace advice given to you by your health care provider. Make sure you discuss any questions you have with your health care provider. Document Released: 11/29/2007 Document Revised: 06/29/2016 Document Reviewed: 06/29/2016 Elsevier Interactive Patient Education  2017 [employer].

## 2017-10-19 ENCOUNTER — Emergency Department (HOSPITAL_COMMUNITY)
Admission: EM | Admit: 2017-10-19 | Discharge: 2017-10-19 | Disposition: A | Discharge status: Home / Self Care | Payer: BLUE CROSS/BLUE SHIELD | Attending: Emergency Medicine | Admitting: Emergency Medicine

## 2017-10-19 ENCOUNTER — Encounter (HOSPITAL_COMMUNITY): Payer: Self-pay | Admitting: Emergency Medicine

## 2017-10-19 DIAGNOSIS — Z87891 Personal history of nicotine dependence: Secondary | ICD-10-CM | POA: Diagnosis not present

## 2017-10-19 DIAGNOSIS — J449 Chronic obstructive pulmonary disease, unspecified: Secondary | ICD-10-CM | POA: Insufficient documentation

## 2017-10-19 DIAGNOSIS — I509 Heart failure, unspecified: Secondary | ICD-10-CM | POA: Insufficient documentation

## 2017-10-19 DIAGNOSIS — N183 Chronic kidney disease, stage 3 (moderate): Secondary | ICD-10-CM | POA: Insufficient documentation

## 2017-10-19 DIAGNOSIS — I130 Hypertensive heart and chronic kidney disease with heart failure and stage 1 through stage 4 chronic kidney disease, or unspecified chronic kidney disease: Secondary | ICD-10-CM | POA: Insufficient documentation

## 2017-10-19 DIAGNOSIS — Z7901 Long term (current) use of anticoagulants: Secondary | ICD-10-CM | POA: Insufficient documentation

## 2017-10-19 DIAGNOSIS — Z8673 Personal history of transient ischemic attack (TIA), and cerebral infarction without residual deficits: Secondary | ICD-10-CM | POA: Diagnosis not present

## 2017-10-19 DIAGNOSIS — R791 Abnormal coagulation profile: Secondary | ICD-10-CM | POA: Diagnosis present

## 2017-10-19 DIAGNOSIS — Z79899 Other long term (current) drug therapy: Secondary | ICD-10-CM | POA: Diagnosis not present

## 2017-10-19 LAB — CBC WITH DIFFERENTIAL/PLATELET
Basophils Absolute: 0 10*3/uL (ref 0.0–0.1)
Basophils Relative: 1 %
Eosinophils Absolute: 0.4 10*3/uL (ref 0.0–0.7)
Eosinophils Relative: 5 %
HEMATOCRIT: 36 % (ref 36.0–46.0)
Hemoglobin: 9.8 g/dL — ABNORMAL LOW (ref 12.0–15.0)
LYMPHS PCT: 35 %
Lymphs Abs: 2.6 10*3/uL (ref 0.7–4.0)
MCH: 20.8 pg — ABNORMAL LOW (ref 26.0–34.0)
MCHC: 27.2 g/dL — AB (ref 30.0–36.0)
MCV: 76.4 fL — AB (ref 78.0–100.0)
MONO ABS: 0.8 10*3/uL (ref 0.1–1.0)
MONOS PCT: 11 %
NEUTROS ABS: 3.7 10*3/uL (ref 1.7–7.7)
Neutrophils Relative %: 48 %
Platelets: 440 10*3/uL — ABNORMAL HIGH (ref 150–400)
RBC: 4.71 MIL/uL (ref 3.87–5.11)
RDW: 17.1 % — AB (ref 11.5–15.5)
WBC: 7.5 10*3/uL (ref 4.0–10.5)

## 2017-10-19 LAB — PROTIME-INR
INR: 6.18
Prothrombin Time: 54.3 seconds — ABNORMAL HIGH (ref 11.4–15.2)

## 2017-10-19 LAB — BASIC METABOLIC PANEL
ANION GAP: 12 (ref 5–15)
BUN: 16 mg/dL (ref 6–20)
CALCIUM: 8.6 mg/dL — AB (ref 8.9–10.3)
CO2: 23 mmol/L (ref 22–32)
CREATININE: 1.44 mg/dL — AB (ref 0.44–1.00)
Chloride: 103 mmol/L (ref 101–111)
GFR calc Af Amer: 44 mL/min — ABNORMAL LOW (ref >60)
GFR calc non Af Amer: 38 mL/min — ABNORMAL LOW (ref >60)
GLUCOSE: 80 mg/dL (ref 65–99)
Potassium: 2.9 mmol/L — ABNORMAL LOW (ref 3.5–5.1)
Sodium: 138 mmol/L (ref 135–145)

## 2017-10-19 NOTE — Discharge Instructions (Addendum)
Hold your Coumadin on Saturday.  On Sunday take 2.5 mg and then alternate 5 mg and 2.5 mg.  Follow-up with your primary care doctor for a repeat blood test next week.

## 2017-10-19 NOTE — ED Notes (Signed)
Date and time results received: 10/19/17 1948   Test: INR Critical Value: 6.18  Name of Provider Notified: Lacinda Axon, MD Notified

## 2017-10-19 NOTE — ED Triage Notes (Signed)
Pt has an INR 8.5

## 2017-10-23 ENCOUNTER — Telehealth: Payer: Self-pay

## 2017-10-23 ENCOUNTER — Ambulatory Visit: Payer: Self-pay | Admitting: Adult Health

## 2017-10-23 ENCOUNTER — Encounter: Payer: Self-pay | Admitting: Adult Health

## 2017-10-23 NOTE — ED Provider Notes (Signed)
[facility] EMERGENCY DEPARTMENT Provider Note   CSN: [id number] Arrival date & time: 10/19/17  1543     History   Chief Complaint Chief Complaint  Patient presents with  . Abnormal Lab    HPI Glenda Johnson is a 62 y.o. female.  Patient presents with a concern over an abnormal INR.  She presently takes Coumadin 5 mg daily.  She stated her INR today was 8.9.  No obvious bleeding noticed.  No mental status changes, neurological deficits, chest pain, dyspnea, hematuria, rectal bleeding.  She actually has no specific complaints.  Severity is mild.     Past Medical History:  Diagnosis Date  . Atrial fibrillation ([place])   . CHF (congestive heart failure) ([place])   . COPD (chronic obstructive pulmonary disease) ([place])   . Hepatitis C   . Pulmonary thrombosis ([place])   . Renal artery stenosis ([place])   . Secondary hypertension   . Stroke ([place])   . Tobacco abuse     Patient Active Problem List   Diagnosis Date Noted  . CKD (chronic kidney disease) stage 3, GFR 30-59 ml/min (HCC) 10/09/2017  . Hypokalemia 10/09/2017  . Dyspnea 10/09/2017  . CHF (congestive heart failure) ([place]) 10/09/2017  . Cerebral thrombosis with cerebral infarction 09/20/2017  . Congestive heart failure ([place])   . 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 Cardiac mass   . Anticoagulation adequate   . Flash pulmonary edema ([place]) 09/15/2017  . Community acquired pneumonia   . Abnormal echocardiogram   . Hypoxia 05/22/2016  . Acute respiratory failure with hypoxia ([place]) 05/22/2016  . Acute respiratory failure ([place]) 05/22/2016  . Sepsis ([place]) 05/22/2016  . Elevated troponin 05/22/2016  . COPD (chronic obstructive pulmonary disease) ([place]) 05/22/2016  . AKI (acute kidney injury) ([place]) 05/22/2016  . Acute pulmonary edema [facility])     Past Surgical History:  Procedure Laterality Date  . BACK SURGERY    . CARDIAC CATHETERIZATION N/A 05/25/2016   Procedure: Left Heart Cath and  Coronary Angiography;  Surgeon: Lorretta Harp, MD;  Location: [place] [facility];  Service: Cardiovascular;  Laterality: N/A;  . stent renal artery       OB History   None      Home Medications    Prior to Admission medications   Medication Sig Start Date End Date Taking? Authorizing Provider  albuterol (PROVENTIL HFA;VENTOLIN HFA) 108 (90 Base) MCG/ACT inhaler Inhale 2 puffs into the lungs every 4 (four) hours as needed. 09/10/17 09/10/18  [provider]  carvedilol (COREG) 12.5 MG tablet Take 1 tablet (12.5 mg total) by mouth 2 (two) times daily with a meal. 09/24/17   Velvet Bathe, MD  furosemide (LASIX) 40 MG tablet Take 1 tablet (40 mg total) by mouth daily. 10/10/17 11/09/17  Johnson, Clanford L, MD  LORazepam (ATIVAN) 0.5 MG tablet Take 0.5 mg by mouth every 8 (eight) hours.    [provider]  nitroGLYCERIN (NITROSTAT) 0.4 MG SL tablet Place 0.4 mg under the tongue every 5 (five) minutes as needed for chest pain.    [provider]  pantoprazole (PROTONIX) 40 MG tablet Take 1 tablet (40 mg total) by mouth daily at 12 noon. 09/25/17   Velvet Bathe, MD  potassium chloride SA (K-DUR,KLOR-CON) 20 MEQ tablet Take 2 tablets (40 mEq total) by mouth daily. 10/10/17 11/09/17  Murlean Iba, MD  warfarin (COUMADIN) 5 MG tablet Take 1 tablet (5 mg total) by mouth daily. Patient  taking differently: Take 2.5-5 mg by mouth daily. 2.5mg  daily except take 5mg  on Friday 09/24/17   Velvet Bathe, MD    Family History Family History  Problem Relation Age of Onset  . Diabetes Maternal Grandmother     Social History Social History   Tobacco Use  . Smoking status: Former Research scientist (life sciences)  . Smokeless tobacco: Never Used  Substance Use Topics  . Alcohol use: No  . Drug use: No     Allergies   Codeine; Tramadol; Clarithromycin; Citalopram; Fish allergy; Other; and Wellbutrin [bupropion]   Review of Systems Review of Systems  All other systems reviewed and are  negative.    Physical Exam Updated Vital Signs BP (!) 155/84   Pulse 74   Temp 98.3 F (36.8 C) (Oral)   Resp 16   Ht 5\' 7"  (1.702 m)   Wt 61.7 kg (136 lb)   SpO2 97%   BMI 21.30 kg/m   Physical Exam  Constitutional: She is oriented to person, place, and time. She appears well-developed and well-nourished.  HENT:  Head: Normocephalic and atraumatic.  Eyes: Conjunctivae are normal.  Neck: Neck supple.  Cardiovascular: Normal rate and regular rhythm.  Pulmonary/Chest: Effort normal and breath sounds normal.  Abdominal: Soft. Bowel sounds are normal.  Musculoskeletal: Normal range of motion.  Neurological: She is alert and oriented to person, place, and time.  Skin: Skin is warm and dry.  Psychiatric: She has a normal mood and affect.  Nursing note and vitals reviewed.    ED Treatments / Results  Labs (all labs ordered are listed, but only abnormal results are displayed) Labs Reviewed  CBC WITH DIFFERENTIAL/PLATELET - Abnormal; Notable for the following components:      Result Value   Hemoglobin 9.8 (*)    MCV 76.4 (*)    MCH 20.8 (*)    MCHC 27.2 (*)    RDW 17.1 (*)    Platelets 440 (*)    All other components within normal limits  BASIC METABOLIC PANEL - Abnormal; Notable for the following components:   Potassium 2.9 (*)    Creatinine, Ser 1.44 (*)    Calcium 8.6 (*)    GFR calc non Af Amer 38 (*)    GFR calc Af Amer 44 (*)    All other components within normal limits  PROTIME-INR - Abnormal; Notable for the following components:   Prothrombin Time 54.3 (*)    INR 6.18 (*)    All other components within normal limits    EKG None  Radiology No results found.  Procedures Procedures (including critical care time)  Medications Ordered in ED Medications - No data to display   Initial Impression / Assessment and Plan / ED Course  I have reviewed the triage vital signs and the nursing notes.  Pertinent labs & imaging results that were available  during my care of the patient were reviewed by me and considered in my medical decision making (see chart for details).     Patient is in no acute distress.  Potassium slightly suboptimal.  INR 6.18.  Patient will hold Coumadin on Saturday and start 2.5 mg alternate 5.0 mg starting Sunday.  She will follow-up with her primary care doctor for further evaluation.  Final Clinical Impressions(s) / ED Diagnoses   Final diagnoses:  Elevated INR    ED Discharge Orders    None       Nat Christen, MD 10/23/17 1232

## 2017-10-23 NOTE — Progress Notes (Deleted)
[facility] [address] [place]. [place] [address] [phone]       OFFICE FOLLOW UP NOTE  [name] Date of Birth:  10/17/1955 Medical Record Number:  [id number]   Reason for Referral:  hospital stroke follow up  CHIEF COMPLAINT:  No chief complaint on file.   HPI: Glenda Johnson is being seen today for initial visit in the office for multiple territory bilateral infarcts on 09/20/17. History obtained from *** and chart review. Reviewed all radiology images and labs personally.   ROS:   14 system review of systems performed and negative with exception of ***  PMH:  Past Medical History:  Diagnosis Date  . Atrial fibrillation ([place])   . CHF (congestive heart failure) ([place])   . COPD (chronic obstructive pulmonary disease) ([place])   . Hepatitis C   . Pulmonary thrombosis ([place])   . Renal artery stenosis ([place])   . Secondary hypertension   . Stroke ([place])   . Tobacco abuse     PSH:  Past Surgical History:  Procedure Laterality Date  . BACK SURGERY    . CARDIAC CATHETERIZATION N/A 05/25/2016   Procedure: Left Heart Cath and Coronary Angiography;  Surgeon: Lorretta Harp, MD;  Location: [place] [facility];  Service: Cardiovascular;  Laterality: N/A;  . stent renal artery      Social History:  Social History   Socioeconomic History  . Marital status: Divorced    Spouse name: Not on file  . Number of children: Not on file  . Years of education: Not on file  . Highest education level: Not on file  Occupational History  . Not on file  Social Needs  . Financial resource strain: Not on file  . Food insecurity:    Worry: Not on file    Inability: Not on file  . Transportation needs:    Medical: Not on file    Non-medical: Not on file  Tobacco Use  . Smoking status: Former Research scientist (life sciences)  . Smokeless tobacco: Never Used  Substance and Sexual Activity  . Alcohol use: No  . Drug use: No  . Sexual activity: Not Currently    Birth  control/protection: None  Lifestyle  . Physical activity:    Days per week: Not on file    Minutes per session: Not on file  . Stress: Not on file  Relationships  . Social connections:    Talks on phone: Not on file    Gets together: Not on file    Attends religious service: Not on file    Active member of club or organization: Not on file    Attends meetings of clubs or organizations: Not on file    Relationship status: Not on file  . Intimate partner violence:    Fear of current or ex partner: Not on file    Emotionally abused: Not on file    Physically abused: Not on file    Forced sexual activity: Not on file  Other Topics Concern  . Not on file  Social History Narrative  . Not on file    Family History:  Family History  Problem Relation Age of Onset  . Diabetes Maternal Grandmother     Medications:   Current Outpatient Medications on File Prior to Visit  Medication Sig Dispense Refill  . albuterol (PROVENTIL HFA;VENTOLIN HFA) 108 (90 Base) MCG/ACT inhaler Inhale 2 puffs into the lungs every 4 (four) hours as needed.    . carvedilol (COREG) 12.5  MG tablet Take 1 tablet (12.5 mg total) by mouth 2 (two) times daily with a meal.    . furosemide (LASIX) 40 MG tablet Take 1 tablet (40 mg total) by mouth daily. 30 tablet 0  . LORazepam (ATIVAN) 0.5 MG tablet Take 0.5 mg by mouth every 8 (eight) hours.    . nitroGLYCERIN (NITROSTAT) 0.4 MG SL tablet Place 0.4 mg under the tongue every 5 (five) minutes as needed for chest pain.    . pantoprazole (PROTONIX) 40 MG tablet Take 1 tablet (40 mg total) by mouth daily at 12 noon.    . potassium chloride SA (K-DUR,KLOR-CON) 20 MEQ tablet Take 2 tablets (40 mEq total) by mouth daily. 60 tablet 0  . warfarin (COUMADIN) 5 MG tablet Take 1 tablet (5 mg total) by mouth daily. (Patient taking differently: Take 2.5-5 mg by mouth daily. 2.5mg  daily except take 5mg  on Friday) 10 tablet 0   No current facility-administered medications on file  prior to visit.     Allergies:   Allergies  Allergen Reactions  . Codeine Anaphylaxis  . Tramadol Anaphylaxis    No hydrocodone or anything connected No hydrocodone or anything connected   . Clarithromycin Nausea And Vomiting  . Citalopram Other (See Comments)    Caused more depression   . Fish Allergy Itching    ears  . Other     Patient says that all narcotics give her anaphylaxis.   . Wellbutrin [Bupropion] Other (See Comments)    "could not speak a full sentence for a whole year"     Physical Exam  There were no vitals filed for this visit. There is no height or weight on file to calculate BMI. No exam data present  General: well developed, well nourished, seated, in no evident distress Head: head normocephalic and atraumatic.   Neck: supple with no carotid or supraclavicular bruits Cardiovascular: regular rate and rhythm, no murmurs Musculoskeletal: no deformity Skin:  no rash/petichiae Vascular:  Normal pulses all extremities  Neurologic Exam Mental Status: Awake and fully alert. Oriented to place and time. Recent and remote memory intact. Attention span, concentration and fund of knowledge appropriate. Mood and affect appropriate.  No flowsheet data found. Cranial Nerves: Fundoscopic exam reveals sharp disc margins. Pupils equal, briskly reactive to light. Extraocular movements full without nystagmus. Visual fields full to confrontation. Hearing intact. Facial sensation intact. Face, tongue, palate moves normally and symmetrically.  Motor: Normal bulk and tone. Normal strength in all tested extremity muscles. Sensory.: intact to touch , pinprick , position and vibratory sensation.  Coordination: Rapid alternating movements normal in all extremities. Finger-to-nose and heel-to-shin performed accurately bilaterally. Gait and Station: Arises from chair without difficulty. Stance is normal. Gait demonstrates normal stride length and balance . Able to heel, toe and  tandem walk without difficulty.  Reflexes: 1+ and symmetric. Toes downgoing.    NIHSS  *** Modified Rankin  *** HAS-BLED *** CHA2DS2-VASc ***   Diagnostic Data (Labs, Imaging, Testing)   ASSESSMENT: Glenda Johnson is a 62 y.o. year old female here with *** on *** secondary to ***. Vascular risk factors include ***.     PLAN: -Continue {anticoagulants:31417}  and ***  for secondary stroke prevention -F/u with PCP regarding your *** management -continue to monitor BP at home  -Maintain strict control of hypertension with blood pressure goal below 130/90, diabetes with hemoglobin A1c goal below 6.5% and cholesterol with LDL cholesterol (bad cholesterol) goal below 70 mg/dL. I also advised the patient to  eat a healthy diet with plenty of whole grains, cereals, fruits and vegetables, exercise regularly and maintain ideal body weight.  Follow up in *** or call earlier if needed   Greater than 50% time during this *** minute consultation visit was spent on counseling and coordination of care about ***, and ***, discussion about risk benefit of anticoagulation and answering questions.     Venancio Poisson, AGNP-BC  [facility] [address] [place], [place] [address]  Phone [phone] Fax [phone]

## 2017-10-23 NOTE — Telephone Encounter (Signed)
Patient no show for appt today. 

## 2017-10-29 ENCOUNTER — Other Ambulatory Visit (HOSPITAL_COMMUNITY): Payer: Self-pay | Admitting: Respiratory Therapy

## 2017-10-29 DIAGNOSIS — J441 Chronic obstructive pulmonary disease with (acute) exacerbation: Secondary | ICD-10-CM

## 2017-10-31 ENCOUNTER — Inpatient Hospital Stay (HOSPITAL_COMMUNITY)
Admission: EM | Admit: 2017-10-31 | Discharge: 2017-11-06 | DRG: 871 | Disposition: A | Discharge status: Home Health | Payer: BLUE CROSS/BLUE SHIELD | Attending: Internal Medicine | Admitting: Internal Medicine

## 2017-10-31 ENCOUNTER — Encounter (HOSPITAL_COMMUNITY): Payer: Self-pay | Admitting: Emergency Medicine

## 2017-10-31 ENCOUNTER — Emergency Department (HOSPITAL_COMMUNITY): Payer: BLUE CROSS/BLUE SHIELD

## 2017-10-31 ENCOUNTER — Other Ambulatory Visit: Payer: Self-pay

## 2017-10-31 ENCOUNTER — Ambulatory Visit: Payer: Self-pay | Admitting: Adult Health

## 2017-10-31 DIAGNOSIS — Z9889 Other specified postprocedural states: Secondary | ICD-10-CM

## 2017-10-31 DIAGNOSIS — Z833 Family history of diabetes mellitus: Secondary | ICD-10-CM

## 2017-10-31 DIAGNOSIS — R6520 Severe sepsis without septic shock: Secondary | ICD-10-CM

## 2017-10-31 DIAGNOSIS — I639 Cerebral infarction, unspecified: Secondary | ICD-10-CM | POA: Diagnosis present

## 2017-10-31 DIAGNOSIS — I701 Atherosclerosis of renal artery: Secondary | ICD-10-CM | POA: Diagnosis present

## 2017-10-31 DIAGNOSIS — Z888 Allergy status to other drugs, medicaments and biological substances status: Secondary | ICD-10-CM

## 2017-10-31 DIAGNOSIS — Z86711 Personal history of pulmonary embolism: Secondary | ICD-10-CM

## 2017-10-31 DIAGNOSIS — J189 Pneumonia, unspecified organism: Secondary | ICD-10-CM | POA: Diagnosis not present

## 2017-10-31 DIAGNOSIS — Z7901 Long term (current) use of anticoagulants: Secondary | ICD-10-CM

## 2017-10-31 DIAGNOSIS — I5033 Acute on chronic diastolic (congestive) heart failure: Secondary | ICD-10-CM | POA: Diagnosis present

## 2017-10-31 DIAGNOSIS — I482 Chronic atrial fibrillation: Secondary | ICD-10-CM | POA: Diagnosis present

## 2017-10-31 DIAGNOSIS — A419 Sepsis, unspecified organism: Principal | ICD-10-CM | POA: Diagnosis present

## 2017-10-31 DIAGNOSIS — I130 Hypertensive heart and chronic kidney disease with heart failure and stage 1 through stage 4 chronic kidney disease, or unspecified chronic kidney disease: Secondary | ICD-10-CM | POA: Diagnosis present

## 2017-10-31 DIAGNOSIS — Z720 Tobacco use: Secondary | ICD-10-CM | POA: Diagnosis present

## 2017-10-31 DIAGNOSIS — Z885 Allergy status to narcotic agent status: Secondary | ICD-10-CM

## 2017-10-31 DIAGNOSIS — Z8673 Personal history of transient ischemic attack (TIA), and cerebral infarction without residual deficits: Secondary | ICD-10-CM

## 2017-10-31 DIAGNOSIS — J441 Chronic obstructive pulmonary disease with (acute) exacerbation: Secondary | ICD-10-CM

## 2017-10-31 DIAGNOSIS — N183 Chronic kidney disease, stage 3 unspecified: Secondary | ICD-10-CM | POA: Diagnosis present

## 2017-10-31 DIAGNOSIS — R0600 Dyspnea, unspecified: Secondary | ICD-10-CM | POA: Diagnosis present

## 2017-10-31 DIAGNOSIS — I159 Secondary hypertension, unspecified: Secondary | ICD-10-CM | POA: Diagnosis present

## 2017-10-31 DIAGNOSIS — B1920 Unspecified viral hepatitis C without hepatic coma: Secondary | ICD-10-CM | POA: Diagnosis present

## 2017-10-31 DIAGNOSIS — Z87891 Personal history of nicotine dependence: Secondary | ICD-10-CM

## 2017-10-31 DIAGNOSIS — I509 Heart failure, unspecified: Secondary | ICD-10-CM

## 2017-10-31 DIAGNOSIS — R0602 Shortness of breath: Secondary | ICD-10-CM

## 2017-10-31 DIAGNOSIS — I4891 Unspecified atrial fibrillation: Secondary | ICD-10-CM

## 2017-10-31 DIAGNOSIS — Z91018 Allergy to other foods: Secondary | ICD-10-CM

## 2017-10-31 DIAGNOSIS — J440 Chronic obstructive pulmonary disease with acute lower respiratory infection: Secondary | ICD-10-CM | POA: Diagnosis present

## 2017-10-31 LAB — CBC WITH DIFFERENTIAL/PLATELET
BASOS ABS: 0 10*3/uL (ref 0.0–0.1)
Basophils Relative: 1 %
EOS PCT: 0 %
Eosinophils Absolute: 0 10*3/uL (ref 0.0–0.7)
HEMATOCRIT: 35.6 % — AB (ref 36.0–46.0)
Hemoglobin: 10 g/dL — ABNORMAL LOW (ref 12.0–15.0)
LYMPHS ABS: 1.3 10*3/uL (ref 0.7–4.0)
Lymphocytes Relative: 19 %
MCH: 21.3 pg — AB (ref 26.0–34.0)
MCHC: 28.1 g/dL — ABNORMAL LOW (ref 30.0–36.0)
MCV: 75.9 fL — AB (ref 78.0–100.0)
MONO ABS: 0.6 10*3/uL (ref 0.1–1.0)
MONOS PCT: 9 %
Neutro Abs: 5 10*3/uL (ref 1.7–7.7)
Neutrophils Relative %: 71 %
Platelets: 344 10*3/uL (ref 150–400)
RBC: 4.69 MIL/uL (ref 3.87–5.11)
RDW: 19.2 % — AB (ref 11.5–15.5)
WBC: 7 10*3/uL (ref 4.0–10.5)

## 2017-10-31 LAB — PROTIME-INR
INR: 2
PROTHROMBIN TIME: 22.5 s — AB (ref 11.4–15.2)

## 2017-10-31 LAB — BASIC METABOLIC PANEL
Anion gap: 14 (ref 5–15)
BUN: 12 mg/dL (ref 6–20)
CHLORIDE: 98 mmol/L — AB (ref 101–111)
CO2: 23 mmol/L (ref 22–32)
Calcium: 9 mg/dL (ref 8.9–10.3)
Creatinine, Ser: 1.38 mg/dL — ABNORMAL HIGH (ref 0.44–1.00)
GFR calc Af Amer: 46 mL/min — ABNORMAL LOW (ref >60)
GFR calc non Af Amer: 40 mL/min — ABNORMAL LOW (ref >60)
Glucose, Bld: 105 mg/dL — ABNORMAL HIGH (ref 65–99)
POTASSIUM: 3.3 mmol/L — AB (ref 3.5–5.1)
SODIUM: 135 mmol/L (ref 135–145)

## 2017-10-31 LAB — I-STAT CG4 LACTIC ACID, ED: Lactic Acid, Venous: 3.32 mmol/L (ref 0.5–1.9)

## 2017-10-31 LAB — LACTIC ACID, PLASMA: Lactic Acid, Venous: 2.9 mmol/L (ref 0.5–1.9)

## 2017-10-31 MED ORDER — SODIUM CHLORIDE 0.9 % IV SOLN
1.0000 g | Freq: Once | INTRAVENOUS | Status: AC
  Administered 2017-10-31: 1 g via INTRAVENOUS
  Filled 2017-10-31: qty 10

## 2017-10-31 MED ORDER — SODIUM CHLORIDE 0.9 % IV SOLN
1.0000 g | INTRAVENOUS | Status: DC
  Administered 2017-11-01 – 2017-11-05 (×5): 1 g via INTRAVENOUS
  Filled 2017-10-31 (×5): qty 1

## 2017-10-31 MED ORDER — IPRATROPIUM BROMIDE 0.02 % IN SOLN: 0.5000 mg | Freq: Four times a day (QID) | RESPIRATORY_TRACT | Status: DC

## 2017-10-31 MED ORDER — WARFARIN SODIUM 2.5 MG PO TABS
2.5000 mg | ORAL_TABLET | Freq: Once | ORAL | Status: AC
  Administered 2017-10-31: 2.5 mg via ORAL
  Filled 2017-10-31 (×2): qty 1

## 2017-10-31 MED ORDER — FLUTICASONE-UMECLIDIN-VILANT 100-62.5-25 MCG/INH IN AEPB: 1.0000 | INHALATION_SPRAY | Freq: Every day | RESPIRATORY_TRACT | Status: DC

## 2017-10-31 MED ORDER — PANTOPRAZOLE SODIUM 40 MG PO TBEC
40.0000 mg | DELAYED_RELEASE_TABLET | Freq: Every day | ORAL | Status: DC
  Administered 2017-11-03 – 2017-11-04 (×2): 40 mg via ORAL
  Filled 2017-10-31 (×4): qty 1

## 2017-10-31 MED ORDER — METHYLPREDNISOLONE SODIUM SUCC 125 MG IJ SOLR
125.0000 mg | Freq: Once | INTRAMUSCULAR | Status: AC
  Administered 2017-10-31: 125 mg via INTRAVENOUS
  Filled 2017-10-31: qty 2

## 2017-10-31 MED ORDER — SODIUM CHLORIDE 0.9 % IV SOLN: 1.0000 g | INTRAVENOUS | Status: DC

## 2017-10-31 MED ORDER — FUROSEMIDE 40 MG PO TABS
40.0000 mg | ORAL_TABLET | Freq: Every day | ORAL | Status: DC
  Administered 2017-10-31 – 2017-11-03 (×4): 40 mg via ORAL
  Filled 2017-10-31 (×5): qty 1

## 2017-10-31 MED ORDER — CARVEDILOL 12.5 MG PO TABS
12.5000 mg | ORAL_TABLET | Freq: Two times a day (BID) | ORAL | Status: DC
  Administered 2017-11-01 – 2017-11-06 (×11): 12.5 mg via ORAL
  Filled 2017-10-31 (×12): qty 1

## 2017-10-31 MED ORDER — SODIUM CHLORIDE 0.9% FLUSH
3.0000 mL | Freq: Two times a day (BID) | INTRAVENOUS | Status: DC
  Administered 2017-10-31 – 2017-11-05 (×11): 3 mL via INTRAVENOUS

## 2017-10-31 MED ORDER — NITROGLYCERIN 0.4 MG SL SUBL: 0.4000 mg | SUBLINGUAL_TABLET | SUBLINGUAL | Status: DC | PRN

## 2017-10-31 MED ORDER — LORAZEPAM 0.5 MG PO TABS
0.5000 mg | ORAL_TABLET | Freq: Three times a day (TID) | ORAL | Status: DC
  Administered 2017-10-31 – 2017-11-05 (×16): 0.5 mg via ORAL
  Filled 2017-10-31 (×17): qty 1

## 2017-10-31 MED ORDER — ALBUTEROL SULFATE (2.5 MG/3ML) 0.083% IN NEBU
2.5000 mg | INHALATION_SOLUTION | RESPIRATORY_TRACT | Status: DC | PRN
  Administered 2017-11-01: 2.5 mg via RESPIRATORY_TRACT
  Filled 2017-10-31: qty 3

## 2017-10-31 MED ORDER — SODIUM CHLORIDE 0.9 % IV SOLN
500.0000 mg | Freq: Once | INTRAVENOUS | Status: AC
  Administered 2017-10-31: 500 mg via INTRAVENOUS
  Filled 2017-10-31: qty 500

## 2017-10-31 MED ORDER — WARFARIN SODIUM 2.5 MG PO TABS: 2.5000 mg | ORAL_TABLET | Freq: Every day | ORAL | Status: DC

## 2017-10-31 MED ORDER — GUAIFENESIN ER 600 MG PO TB12
600.0000 mg | ORAL_TABLET | Freq: Two times a day (BID) | ORAL | Status: DC
  Administered 2017-10-31 – 2017-11-06 (×12): 600 mg via ORAL
  Filled 2017-10-31 (×12): qty 1

## 2017-10-31 MED ORDER — WARFARIN - PHARMACIST DOSING INPATIENT
Status: DC
  Administered 2017-11-01 – 2017-11-05 (×2)

## 2017-10-31 MED ORDER — ALBUTEROL SULFATE (2.5 MG/3ML) 0.083% IN NEBU: 2.5000 mg | INHALATION_SOLUTION | Freq: Four times a day (QID) | RESPIRATORY_TRACT | Status: DC

## 2017-10-31 MED ORDER — AZITHROMYCIN 500 MG IV SOLR
500.0000 mg | INTRAVENOUS | Status: DC
  Administered 2017-11-01 – 2017-11-02 (×2): 500 mg via INTRAVENOUS
  Filled 2017-10-31 (×4): qty 500

## 2017-10-31 MED ORDER — SODIUM CHLORIDE 0.9% FLUSH: 3.0000 mL | INTRAVENOUS | Status: DC | PRN

## 2017-10-31 MED ORDER — METHYLPREDNISOLONE SODIUM SUCC 125 MG IJ SOLR
80.0000 mg | Freq: Two times a day (BID) | INTRAMUSCULAR | Status: DC
  Administered 2017-10-31 – 2017-11-01 (×2): 80 mg via INTRAVENOUS
  Filled 2017-10-31 (×2): qty 2

## 2017-10-31 MED ORDER — SODIUM CHLORIDE 0.9 % IV SOLN
250.0000 mL | INTRAVENOUS | Status: DC | PRN
Start: 2017-10-31 — End: 2017-11-06

## 2017-10-31 MED ORDER — ALBUTEROL SULFATE (2.5 MG/3ML) 0.083% IN NEBU
5.0000 mg | INHALATION_SOLUTION | Freq: Once | RESPIRATORY_TRACT | Status: AC
  Administered 2017-10-31: 5 mg via RESPIRATORY_TRACT
  Filled 2017-10-31: qty 6

## 2017-10-31 NOTE — ED Notes (Signed)
Radiology in to take patient for chest xray.

## 2017-10-31 NOTE — ED Notes (Signed)
Date and time results received: 10/31/17 7:11 PM  (use smartphrase ".now" to insert current time)  Test: I Stat lactic acid Critical Value: 3.32 mmol/L  Name of Provider Notified: [place]  Orders Received? Or Actions Taken?:

## 2017-10-31 NOTE — ED Provider Notes (Signed)
[facility] EMERGENCY DEPARTMENT Provider Note   CSN: [id number] Arrival date & time: 10/31/17  1809     History   Chief Complaint Chief Complaint  Patient presents with  . Shortness of Breath    HPI Glenda Johnson is a 62 y.o. female.  HPI Patient presents with shortness of breath.  Worse the last couple days.  Has a cough with beige-colored sputum.  Has had fevers and chills.  History of COPD.  Worsening shortness of breath of the last couple days.  Increasing fatigue.  She is not on oxygen at home but says she thinks she may be should be.  States she is ached all over but no specific chest pain. Past Medical History:  Diagnosis Date  . Atrial fibrillation ([place])   . CHF (congestive heart failure) ([place])   . COPD (chronic obstructive pulmonary disease) ([place])   . Hepatitis C   . Pulmonary thrombosis ([place])   . Renal artery stenosis ([place])   . Secondary hypertension   . Stroke ([place])   . Tobacco abuse     Patient Active Problem List   Diagnosis Date Noted  . CKD (chronic kidney disease) stage 3, GFR 30-59 ml/min (HCC) 10/09/2017  . Hypokalemia 10/09/2017  . Dyspnea 10/09/2017  . CHF (congestive heart failure) ([place]) 10/09/2017  . Cerebral thrombosis with cerebral infarction 09/20/2017  . Congestive heart failure ([place])   . Cerebrovascular accident (CVA) due to embolism of cerebral artery ([place])   . Cardiac mass   . Anticoagulation adequate   . Flash pulmonary edema ([place]) 09/15/2017  . Community acquired pneumonia   . Abnormal echocardiogram   . Hypoxia 05/22/2016  . Acute respiratory failure with hypoxia ([place]) 05/22/2016  . Acute respiratory failure ([place]) 05/22/2016  . Sepsis ([place]) 05/22/2016  . Elevated troponin 05/22/2016  . COPD (chronic obstructive pulmonary disease) ([place]) 05/22/2016  . AKI (acute kidney injury) ([place]) 05/22/2016  . Acute pulmonary edema [facility])     Past Surgical History:  Procedure Laterality Date  . BACK SURGERY    . CARDIAC CATHETERIZATION N/A  05/25/2016   Procedure: Left Heart Cath and Coronary Angiography;  Surgeon: Lorretta Harp, MD;  Location: [place] [facility] LAB;  Service: Cardiovascular;  Laterality: N/A;  . stent renal artery       OB History    Gravida  1   Para  1   Term  1   Preterm      AB      Living        SAB      TAB      Ectopic      Multiple      Live Births               Home Medications    Prior to Admission medications   Medication Sig Start Date End Date Taking? Authorizing Provider  albuterol (PROVENTIL HFA;VENTOLIN HFA) 108 (90 Base) MCG/ACT inhaler Inhale 2 puffs into the lungs every 4 (four) hours as needed. 09/10/17 09/10/18  [provider]  carvedilol (COREG) 12.5 MG tablet Take 1 tablet (12.5 mg total) by mouth 2 (two) times daily with a meal. 09/24/17   Velvet Bathe, MD  furosemide (LASIX) 40 MG tablet Take 1 tablet (40 mg total) by mouth daily. 10/10/17 11/09/17  Johnson, Clanford L, MD  LORazepam (ATIVAN) 0.5 MG tablet Take 0.5 mg by mouth every 8 (eight) hours.    [provider]  nitroGLYCERIN (NITROSTAT) 0.4 MG SL tablet  Place 0.4 mg under the tongue every 5 (five) minutes as needed for chest pain.    [provider]  pantoprazole (PROTONIX) 40 MG tablet Take 1 tablet (40 mg total) by mouth daily at 12 noon. 09/25/17   Velvet Bathe, MD  potassium chloride SA (K-DUR,KLOR-CON) 20 MEQ tablet Take 2 tablets (40 mEq total) by mouth daily. 10/10/17 11/09/17  Murlean Iba, MD  warfarin (COUMADIN) 5 MG tablet Take 1 tablet (5 mg total) by mouth daily. Patient taking differently: Take 2.5-5 mg by mouth daily. 2.5mg  daily except take 5mg  on Friday 09/24/17   Velvet Bathe, MD    Family History Family History  Problem Relation Age of Onset  . Diabetes Maternal Grandmother     Social History Social History   Tobacco Use  . Smoking status: Former Research scientist (life sciences)  . Smokeless tobacco: Never Used  Substance Use Topics  . Alcohol use: No  . Drug use: No      Allergies   Codeine; Tramadol; Clarithromycin; Citalopram; Fish allergy; Other; and Wellbutrin [bupropion]   Review of Systems Review of Systems  Constitutional: Positive for appetite change, chills, fatigue and fever.  HENT: Positive for congestion.   Respiratory: Positive for shortness of breath and wheezing.   Cardiovascular: Negative for chest pain and leg swelling.  Gastrointestinal: Negative for abdominal pain.  Genitourinary: Negative for dysuria.  Musculoskeletal: Positive for myalgias.  Skin: Negative for rash.  Neurological: Negative for weakness.  Hematological: Negative for adenopathy.  Psychiatric/Behavioral: Negative for confusion.     Physical Exam Updated Vital Signs BP (!) 174/90   Pulse (!) 102   Temp (!) 100.6 F (38.1 C) (Oral)   Resp (!) 33   Ht 5\' 7"  (1.702 m)   Wt 61.7 kg (136 lb)   SpO2 97%   BMI 21.30 kg/m   Physical Exam  Constitutional: She appears well-developed.  HENT:  Head: Normocephalic.  Neck: Neck supple.  Pulmonary/Chest:  Tachypnea with wheezes and prolonged expirations.  Abdominal: There is no tenderness.  Musculoskeletal:       Right lower leg: She exhibits no edema.       Left lower leg: She exhibits no edema.  Neurological: She is alert.  Skin: Skin is warm. Capillary refill takes less than 2 seconds.  Psychiatric: She has a normal mood and affect.     ED Treatments / Results  Labs (all labs ordered are listed, but only abnormal results are displayed) Labs Reviewed  CBC WITH DIFFERENTIAL/PLATELET - Abnormal; Notable for the following components:      Result Value   Hemoglobin 10.0 (*)    HCT 35.6 (*)    MCV 75.9 (*)    MCH 21.3 (*)    MCHC 28.1 (*)    RDW 19.2 (*)    All other components within normal limits  BASIC METABOLIC PANEL - Abnormal; Notable for the following components:   Potassium 3.3 (*)    Chloride 98 (*)    Glucose, Bld 105 (*)    Creatinine, Ser 1.38 (*)    GFR calc non Af Amer 40 (*)     GFR calc Af Amer 46 (*)    All other components within normal limits  I-STAT CG4 LACTIC ACID, ED - Abnormal; Notable for the following components:   Lactic Acid, Venous 3.32 (*)    All other components within normal limits  PROTIME-INR    EKG EKG Interpretation  Date/Time:  Wednesday Oct 31 2017 18:19:09 EDT Ventricular Rate:  103  PR Interval:    QRS Duration: 74 QT Interval:  334 QTC Calculation: 407 R Axis:   -46 Text Interpretation:  Sinus tachycardia Multiform ventricular premature complexes Left anterior fascicular block Borderline low voltage, extremity leads Confirmed by Davonna Belling [id number]) on 10/31/2017 8:30:11 PM   Radiology Dg Chest 2 View  Result Date: 10/31/2017 CLINICAL DATA:  Worsening cough x2 days with dyspnea. EXAM: CHEST - 2 VIEW COMPARISON:  10/10/2017 FINDINGS: Stable heart size and mediastinal contours. Mild-to-moderate aortic atherosclerosis at the arch without aneurysm. Hazy opacities at each lung base consistent with layering pleural effusions with adjacent presumed atelectasis are stable. Superimposed pneumonia would be difficult to entirely exclude due to the confluent opacities noted at each lung base. Slight decrease in pulmonary vascular redistribution is identified on current exam. No suspicious osseous abnormalities. IMPRESSION: Slight interval decrease in pulmonary vascular redistribution. Stable bilateral layering pleural effusions with presumed atelectasis and/or infiltrates. Electronically Signed   By: Ashley Royalty M.D.   On: 10/31/2017 19:06    Procedures Procedures (including critical care time)  Medications Ordered in ED Medications  azithromycin (ZITHROMAX) 500 mg in sodium chloride 0.9 % 250 mL IVPB (500 mg Intravenous New Bag/Given 10/31/17 2022)  albuterol (PROVENTIL) (2.5 MG/3ML) 0.083% nebulizer solution 5 mg (5 mg Nebulization Given 10/31/17 1918)  methylPREDNISolone sodium succinate (SOLU-MEDROL) 125 mg/2 mL injection 125 mg (125 mg  Intravenous Given 10/31/17 1855)  cefTRIAXone (ROCEPHIN) 1 g in sodium chloride 0.9 % 100 mL IVPB (0 g Intravenous Stopped 10/31/17 1929)     Initial Impression / Assessment and Plan / ED Course  I have reviewed the triage vital signs and the nursing notes.  Pertinent labs & imaging results that were available during my care of the patient were reviewed by me and considered in my medical decision making (see chart for details).     Patient with shortness of breath and fever.  Coughing with wheezes.  X-ray shows possible pneumonia.  History of COPD.  Will treat for COPD exacerbation and community acquired pneumonia.  Has hypoxia without oxygen.  Will admit to hospitalist.  Final Clinical Impressions(s) / ED Diagnoses   Final diagnoses:  COPD exacerbation ([place])  Community acquired pneumonia, unspecified laterality    ED Discharge Orders    None       Davonna Belling, MD 10/31/17 2031

## 2017-10-31 NOTE — Progress Notes (Signed)
CRITICAL VALUE ALERT  Critical Value:  Lactic acid 2.9  Date & Time Notied:  10/31/17 2235  Provider Notified: Silas [place], NP  Orders Received/Actions taken: Awaiting response. No new orders at this time.

## 2017-10-31 NOTE — H&P (Signed)
History and Physical    Glenda Johnson [id number]:[id number] DOB: 03/06/1956 DOA: 10/31/2017  PCP: [name], MD  Patient coming from: Home  Chief Complaint: Shortness of breath and cough  HPI: Glenda Johnson is a 62 y.o. female with medical history significant of COPD, CHF comes in with over 3 days of worsening shortness of breath and cough.  Patient does not require home oxygen.  She denies any fevers.  She denies any swelling.  She went to go see her primary care physician Dr. Luan Pulling yesterday who set her up for some labs but did not start her on anything.  She denies recently been on anabolic's.  Patient be referred for admission for COPD extubation and O2 sats of 89% on room air.   Review of Systems: As per HPI otherwise 10 point review of systems negative.   Past Medical History:  Diagnosis Date  . Atrial fibrillation ([place])   . CHF (congestive heart failure) ([place])   . COPD (chronic obstructive pulmonary disease) ([place])   . Hepatitis C   . Pulmonary thrombosis ([place])   . Renal artery stenosis ([place])   . Secondary hypertension   . Stroke ([place])   . Tobacco abuse     Past Surgical History:  Procedure Laterality Date  . BACK SURGERY    . CARDIAC CATHETERIZATION N/A 05/25/2016   Procedure: Left Heart Cath and Coronary Angiography;  Surgeon: Lorretta Harp, MD;  Location: [facility];  Service: Cardiovascular;  Laterality: N/A;  . PLACEMENT OF BREAST IMPLANTS Bilateral   . stent renal artery       reports that she has quit smoking. She has never used smokeless tobacco. She reports that she does not drink alcohol or use drugs.  Allergies  Allergen Reactions  . Codeine Anaphylaxis  . Tramadol Anaphylaxis    No hydrocodone or anything connected No hydrocodone or anything connected   . Clarithromycin Nausea And Vomiting  . Citalopram Other (See Comments)    Caused more depression   . Fish Allergy Itching    ears  . Other     Patient says that all narcotics give her  anaphylaxis.   . Wellbutrin [Bupropion] Other (See Comments)    "could not speak a full sentence for a whole year"    Family History  Problem Relation Age of Onset  . Diabetes Maternal Grandmother     Prior to Admission medications   Medication Sig Start Date End Date Taking? Authorizing Provider  albuterol (PROVENTIL HFA;VENTOLIN HFA) 108 (90 Base) MCG/ACT inhaler Inhale 2 puffs into the lungs every 4 (four) hours as needed. 09/10/17 09/10/18 Yes [provider]  carvedilol (COREG) 12.5 MG tablet Take 1 tablet (12.5 mg total) by mouth 2 (two) times daily with a meal. 09/24/17  Yes Velvet Bathe, MD  Fluticasone-Umeclidin-Vilant (TRELEGY ELLIPTA) 100-62.5-25 MCG/INH AEPB Inhale 1 puff into the lungs daily.  10/30/17  Yes [provider]  furosemide (LASIX) 40 MG tablet Take 1 tablet (40 mg total) by mouth daily. 10/10/17 11/09/17 Yes Johnson, Clanford L, MD  LORazepam (ATIVAN) 0.5 MG tablet Take 0.5 mg by mouth every 8 (eight) hours.   Yes [provider]  nitroGLYCERIN (NITROSTAT) 0.4 MG SL tablet Place 0.4 mg under the tongue every 5 (five) minutes as needed for chest pain.   Yes [provider]  pantoprazole (PROTONIX) 40 MG tablet Take 1 tablet (40 mg total) by mouth daily at 12 noon. 09/25/17  Yes Velvet Bathe, MD  potassium chloride SA (  K-DUR,KLOR-CON) 20 MEQ tablet Take 2 tablets (40 mEq total) by mouth daily. Patient taking differently: Take 20 mEq by mouth daily.  10/10/17 11/09/17 Yes Johnson, Clanford L, MD  warfarin (COUMADIN) 5 MG tablet Take 1 tablet (5 mg total) by mouth daily. Patient taking differently: Take 2.5 mg by mouth daily.  09/24/17  Yes Velvet Bathe, MD    Physical Exam: Vitals:   10/31/17 1930 10/31/17 2015 10/31/17 2030 10/31/17 2041  BP: (!) 173/85  (!) 152/82   Pulse: 86 93    Resp: (!) 33 (!) 34 (!) 26   Temp:    99.8 F (37.7 C)  TempSrc:    Oral  SpO2: 98% 92%    Weight:      Height:          Constitutional: NAD, calm,  comfortable Vitals:   10/31/17 1930 10/31/17 2015 10/31/17 2030 10/31/17 2041  BP: (!) 173/85  (!) 152/82   Pulse: 86 93    Resp: (!) 33 (!) 34 (!) 26   Temp:    99.8 F (37.7 C)  TempSrc:    Oral  SpO2: 98% 92%    Weight:      Height:       Eyes: PERRL, lids and conjunctivae normal ENMT: Mucous membranes are moist. Posterior pharynx clear of any exudate or lesions.Normal dentition.  Neck: normal, supple, no masses, no thyromegaly Respiratory: clear to auscultation bilaterally, no wheezing, no crackles. Normal respiratory effort. No accessory muscle use.  Cardiovascular: Regular rate and rhythm, no murmurs / rubs / gallops. No extremity edema. 2+ pedal pulses. No carotid bruits.  Abdomen: no tenderness, no masses palpated. No hepatosplenomegaly. Bowel sounds positive.  Musculoskeletal: no clubbing / cyanosis. No joint deformity upper and lower extremities. Good ROM, no contractures. Normal muscle tone.  Skin: no rashes, lesions, ulcers. No induration Neurologic: CN 2-12 grossly intact. Sensation intact, DTR normal. Strength 5/5 in all 4.  Psychiatric: Normal judgment and insight. Alert and oriented x 3. Normal mood.    Labs on Admission: I have personally reviewed following labs and imaging studies  CBC: Recent Labs  Lab 10/31/17 1905  WBC 7.0  NEUTROABS 5.0  HGB 10.0*  HCT 35.6*  MCV 75.9*  PLT 174   Basic Metabolic Panel: Recent Labs  Lab 10/31/17 1905  NA 135  K 3.3*  CL 98*  CO2 23  GLUCOSE 105*  BUN 12  CREATININE 1.38*  CALCIUM 9.0   GFR: Estimated Creatinine Clearance: 40.6 mL/min (A) (by C-G formula based on SCr of 1.38 mg/dL (H)). Liver Function Tests: No results for input(s): AST, ALT, ALKPHOS, BILITOT, PROT, ALBUMIN in the last 168 hours. No results for input(s): LIPASE, AMYLASE in the last 168 hours. No results for input(s): AMMONIA in the last 168 hours. Coagulation Profile: Recent Labs  Lab 10/31/17 1905  INR 2.00   Cardiac Enzymes: No  results for input(s): CKTOTAL, CKMB, CKMBINDEX, TROPONINI in the last 168 hours. BNP (last 3 results) No results for input(s): PROBNP in the last 8760 hours. HbA1C: No results for input(s): HGBA1C in the last 72 hours. CBG: No results for input(s): GLUCAP in the last 168 hours. Lipid Profile: No results for input(s): CHOL, HDL, LDLCALC, TRIG, CHOLHDL, LDLDIRECT in the last 72 hours. Thyroid Function Tests: No results for input(s): TSH, T4TOTAL, FREET4, T3FREE, THYROIDAB in the last 72 hours. Anemia Panel: No results for input(s): VITAMINB12, FOLATE, FERRITIN, TIBC, IRON, RETICCTPCT in the last 72 hours. Urine analysis:    Component  Value Date/Time   COLORURINE STRAW (A) 10/09/2017 1325   APPEARANCEUR CLEAR 10/09/2017 1325   LABSPEC 1.004 (L) 10/09/2017 1325   PHURINE 8.0 10/09/2017 1325   GLUCOSEU NEGATIVE 10/09/2017 1325   HGBUR NEGATIVE 10/09/2017 1325   BILIRUBINUR NEGATIVE 10/09/2017 1325   KETONESUR NEGATIVE 10/09/2017 1325   PROTEINUR NEGATIVE 10/09/2017 1325   NITRITE NEGATIVE 10/09/2017 1325   LEUKOCYTESUR NEGATIVE 10/09/2017 1325   Sepsis Labs: !!!!!!!!!!!!!!!!!!!!!!!!!!!!!!!!!!!!!!!!!!!! @LABRCNTIP (procalcitonin:4,lacticidven:4) )No results found for this or any previous visit (from the past 240 hour(s)).   Radiological Exams on Admission: Dg Chest 2 View  Result Date: 10/31/2017 CLINICAL DATA:  Worsening cough x2 days with dyspnea. EXAM: CHEST - 2 VIEW COMPARISON:  10/10/2017 FINDINGS: Stable heart size and mediastinal contours. Mild-to-moderate aortic atherosclerosis at the arch without aneurysm. Hazy opacities at each lung base consistent with layering pleural effusions with adjacent presumed atelectasis are stable. Superimposed pneumonia would be difficult to entirely exclude due to the confluent opacities noted at each lung base. Slight decrease in pulmonary vascular redistribution is identified on current exam. No suspicious osseous abnormalities. IMPRESSION: Slight  interval decrease in pulmonary vascular redistribution. Stable bilateral layering pleural effusions with presumed atelectasis and/or infiltrates. Electronically Signed   By: Ashley Royalty M.D.   On: 10/31/2017 19:06   Old chart reviewed Case discussed with EDP Chest x-ray reviewed with bilateral pleural effusions old compared with  last chest x-ray cannot rule out infiltrates  Assessment/Plan 62 year old female with mild COPD exacerbation Principal Problem:   COPD exacerbation (HCC)-with [facility] pneumonia-IV Rocephin and azithromycin.  Frequent nebs.  IV Solu-Medrol Medrol.  Active Problems:   CKD (chronic kidney disease) stage 3, GFR 30-59 ml/min (HCC)-stable at baseline   Dyspnea-due to COPD   CHF (congestive heart failure) (HCC)-stable   Atrial fibrillation (HCC)-continue Coumadin     DVT prophylaxis: Coumadin Code Status: Full Family Communication: None  Disposition Plan: Per day  team  Consults called: None  Admission status: Observation   Selso Mannor A MD [facility]  If 7PM-7AM, please contact night-coverage [email or web address] Password [facility]  10/31/2017, 8:55 PM

## 2017-10-31 NOTE — ED Triage Notes (Signed)
Patient with worsening cough x 2 days. Difficulty maintaining O2 sat.

## 2017-10-31 NOTE — Progress Notes (Signed)
ANTICOAGULATION CONSULT NOTE - Initial Consult  Pharmacy Consult for Warfarin Indication: atrial fibrillation  Allergies  Allergen Reactions  . Codeine Anaphylaxis  . Tramadol Anaphylaxis    No hydrocodone or anything connected No hydrocodone or anything connected   . Clarithromycin Nausea And Vomiting  . Citalopram Other (See Comments)    Caused more depression   . Fish Allergy Itching    ears  . Other     Patient says that all narcotics give her anaphylaxis.   . Wellbutrin [Bupropion] Other (See Comments)    "could not speak a full sentence for a whole year"    Patient Measurements: Height: 5\' 7"  (170.2 cm) Weight: 136 lb (61.7 kg) IBW/kg (Calculated) : 61.6 Heparin Dosing Weight:   Vital Signs: Temp: 99.8 F (37.7 C) (05/08 2041) Temp Source: Oral (05/08 2041) BP: 152/82 (05/08 2030) Pulse Rate: 93 (05/08 2015)  Labs: Recent Labs    10/31/17 1905  HGB 10.0*  HCT 35.6*  PLT 344  LABPROT 22.5*  INR 2.00  CREATININE 1.38*    Estimated Creatinine Clearance: 40.6 mL/min (A) (by C-G formula based on SCr of 1.38 mg/dL (H)).   Medical History: Past Medical History:  Diagnosis Date  . Atrial fibrillation ([place])   . CHF (congestive heart failure) ([place])   . COPD (chronic obstructive pulmonary disease) ([place])   . Hepatitis C   . Pulmonary thrombosis ([place])   . Renal artery stenosis ([place])   . Secondary hypertension   . Stroke ([place])   . Tobacco abuse     Medications:   (Not in a hospital admission) See med rec Warfarin PTA, dose not received today.  Assessment: INR @ goal, no bleeding noted.   Goal of Therapy:  INR 2-3   Plan:  Warfarin 2.5mg  PO x 1 Daily PT/INR Monitor for signs and symptoms of bleeding.   Pricilla Larsson 10/31/2017,9:02 PM

## 2017-11-01 ENCOUNTER — Encounter (HOSPITAL_COMMUNITY): Payer: Self-pay | Admitting: Family Medicine

## 2017-11-01 ENCOUNTER — Inpatient Hospital Stay (HOSPITAL_COMMUNITY): Payer: BLUE CROSS/BLUE SHIELD

## 2017-11-01 DIAGNOSIS — J441 Chronic obstructive pulmonary disease with (acute) exacerbation: Secondary | ICD-10-CM | POA: Diagnosis present

## 2017-11-01 DIAGNOSIS — I701 Atherosclerosis of renal artery: Secondary | ICD-10-CM | POA: Diagnosis present

## 2017-11-01 DIAGNOSIS — I482 Chronic atrial fibrillation: Secondary | ICD-10-CM | POA: Diagnosis present

## 2017-11-01 DIAGNOSIS — A419 Sepsis, unspecified organism: Secondary | ICD-10-CM | POA: Diagnosis present

## 2017-11-01 DIAGNOSIS — R6520 Severe sepsis without septic shock: Secondary | ICD-10-CM

## 2017-11-01 DIAGNOSIS — Z86711 Personal history of pulmonary embolism: Secondary | ICD-10-CM | POA: Diagnosis not present

## 2017-11-01 DIAGNOSIS — I130 Hypertensive heart and chronic kidney disease with heart failure and stage 1 through stage 4 chronic kidney disease, or unspecified chronic kidney disease: Secondary | ICD-10-CM | POA: Diagnosis present

## 2017-11-01 DIAGNOSIS — J189 Pneumonia, unspecified organism: Secondary | ICD-10-CM | POA: Diagnosis present

## 2017-11-01 DIAGNOSIS — Z87891 Personal history of nicotine dependence: Secondary | ICD-10-CM | POA: Diagnosis not present

## 2017-11-01 DIAGNOSIS — R0600 Dyspnea, unspecified: Secondary | ICD-10-CM | POA: Diagnosis not present

## 2017-11-01 DIAGNOSIS — B1920 Unspecified viral hepatitis C without hepatic coma: Secondary | ICD-10-CM | POA: Diagnosis present

## 2017-11-01 DIAGNOSIS — J440 Chronic obstructive pulmonary disease with acute lower respiratory infection: Secondary | ICD-10-CM | POA: Diagnosis present

## 2017-11-01 DIAGNOSIS — I4891 Unspecified atrial fibrillation: Secondary | ICD-10-CM | POA: Diagnosis present

## 2017-11-01 DIAGNOSIS — Z885 Allergy status to narcotic agent status: Secondary | ICD-10-CM | POA: Diagnosis not present

## 2017-11-01 DIAGNOSIS — Z7901 Long term (current) use of anticoagulants: Secondary | ICD-10-CM | POA: Diagnosis not present

## 2017-11-01 DIAGNOSIS — N183 Chronic kidney disease, stage 3 (moderate): Secondary | ICD-10-CM | POA: Diagnosis present

## 2017-11-01 DIAGNOSIS — Z720 Tobacco use: Secondary | ICD-10-CM | POA: Diagnosis present

## 2017-11-01 DIAGNOSIS — I159 Secondary hypertension, unspecified: Secondary | ICD-10-CM | POA: Diagnosis present

## 2017-11-01 DIAGNOSIS — I5033 Acute on chronic diastolic (congestive) heart failure: Secondary | ICD-10-CM | POA: Diagnosis present

## 2017-11-01 DIAGNOSIS — I5032 Chronic diastolic (congestive) heart failure: Secondary | ICD-10-CM

## 2017-11-01 DIAGNOSIS — I639 Cerebral infarction, unspecified: Secondary | ICD-10-CM | POA: Diagnosis present

## 2017-11-01 DIAGNOSIS — Z888 Allergy status to other drugs, medicaments and biological substances status: Secondary | ICD-10-CM | POA: Diagnosis not present

## 2017-11-01 DIAGNOSIS — Z8673 Personal history of transient ischemic attack (TIA), and cerebral infarction without residual deficits: Secondary | ICD-10-CM | POA: Diagnosis not present

## 2017-11-01 DIAGNOSIS — Z91018 Allergy to other foods: Secondary | ICD-10-CM | POA: Diagnosis not present

## 2017-11-01 DIAGNOSIS — Z833 Family history of diabetes mellitus: Secondary | ICD-10-CM | POA: Diagnosis not present

## 2017-11-01 LAB — CBC
HEMATOCRIT: 34.1 % — AB (ref 36.0–46.0)
Hemoglobin: 9.5 g/dL — ABNORMAL LOW (ref 12.0–15.0)
MCH: 21.3 pg — ABNORMAL LOW (ref 26.0–34.0)
MCHC: 27.9 g/dL — ABNORMAL LOW (ref 30.0–36.0)
MCV: 76.3 fL — ABNORMAL LOW (ref 78.0–100.0)
PLATELETS: 264 10*3/uL (ref 150–400)
RBC: 4.47 MIL/uL (ref 3.87–5.11)
RDW: 18.9 % — ABNORMAL HIGH (ref 11.5–15.5)
WBC: 3.6 10*3/uL — AB (ref 4.0–10.5)

## 2017-11-01 LAB — BASIC METABOLIC PANEL
Anion gap: 12 (ref 5–15)
BUN: 16 mg/dL (ref 6–20)
CO2: 23 mmol/L (ref 22–32)
Calcium: 8.8 mg/dL — ABNORMAL LOW (ref 8.9–10.3)
Chloride: 98 mmol/L — ABNORMAL LOW (ref 101–111)
Creatinine, Ser: 1.29 mg/dL — ABNORMAL HIGH (ref 0.44–1.00)
GFR, EST AFRICAN AMERICAN: 50 mL/min — AB (ref >60)
GFR, EST NON AFRICAN AMERICAN: 43 mL/min — AB (ref >60)
Glucose, Bld: 156 mg/dL — ABNORMAL HIGH (ref 65–99)
POTASSIUM: 3.6 mmol/L (ref 3.5–5.1)
SODIUM: 133 mmol/L — AB (ref 135–145)

## 2017-11-01 LAB — LACTATE DEHYDROGENASE, PLEURAL OR PERITONEAL FLUID: LD, Fluid: 73 U/L — ABNORMAL HIGH (ref 3–23)

## 2017-11-01 LAB — BODY FLUID CELL COUNT WITH DIFFERENTIAL
Eos, Fluid: 0 %
Lymphs, Fluid: 81 %
MONOCYTE-MACROPHAGE-SEROUS FLUID: 16 % — AB (ref 50–90)
Neutrophil Count, Fluid: 3 % (ref 0–25)
Other Cells, Fluid: REACTIVE %
WBC FLUID: 538 uL (ref 0–1000)

## 2017-11-01 LAB — LACTIC ACID, PLASMA
LACTIC ACID, VENOUS: 3.9 mmol/L — AB (ref 0.5–1.9)
Lactic Acid, Venous: 2.7 mmol/L (ref 0.5–1.9)

## 2017-11-01 LAB — GRAM STAIN

## 2017-11-01 LAB — PROTIME-INR
INR: 2.21
Prothrombin Time: 24.3 seconds — ABNORMAL HIGH (ref 11.4–15.2)

## 2017-11-01 LAB — GLUCOSE, PLEURAL OR PERITONEAL FLUID: GLUCOSE FL: 208 mg/dL

## 2017-11-01 LAB — PROTEIN, PLEURAL OR PERITONEAL FLUID: Total protein, fluid: <3 g/dL

## 2017-11-01 MED ORDER — IPRATROPIUM-ALBUTEROL 0.5-2.5 (3) MG/3ML IN SOLN
3.0000 mL | Freq: Four times a day (QID) | RESPIRATORY_TRACT | Status: DC
  Administered 2017-11-01 – 2017-11-06 (×17): 3 mL via RESPIRATORY_TRACT
  Filled 2017-11-01 (×18): qty 3

## 2017-11-01 MED ORDER — BUDESONIDE 0.25 MG/2ML IN SUSP
0.2500 mg | Freq: Two times a day (BID) | RESPIRATORY_TRACT | Status: DC
  Administered 2017-11-01 – 2017-11-06 (×11): 0.25 mg via RESPIRATORY_TRACT
  Filled 2017-11-01 (×11): qty 2

## 2017-11-01 MED ORDER — UMECLIDINIUM-VILANTEROL 62.5-25 MCG/INH IN AEPB
1.0000 | INHALATION_SPRAY | Freq: Every day | RESPIRATORY_TRACT | Status: DC
  Administered 2017-11-01 – 2017-11-06 (×6): 1 via RESPIRATORY_TRACT
  Filled 2017-11-01: qty 14

## 2017-11-01 MED ORDER — METHYLPREDNISOLONE SODIUM SUCC 125 MG IJ SOLR
80.0000 mg | Freq: Three times a day (TID) | INTRAMUSCULAR | Status: DC
  Administered 2017-11-01 – 2017-11-03 (×7): 80 mg via INTRAVENOUS
  Filled 2017-11-01 (×7): qty 2

## 2017-11-01 MED ORDER — ORAL CARE MOUTH RINSE
15.0000 mL | Freq: Two times a day (BID) | OROMUCOSAL | Status: DC
  Administered 2017-11-01 – 2017-11-05 (×6): 15 mL via OROMUCOSAL

## 2017-11-01 MED ORDER — WARFARIN SODIUM 2.5 MG PO TABS
2.5000 mg | ORAL_TABLET | Freq: Once | ORAL | Status: AC
  Administered 2017-11-01: 2.5 mg via ORAL
  Filled 2017-11-01: qty 1

## 2017-11-01 NOTE — Procedures (Signed)
PreOperative Dx: RT pleural effusion Postoperative Dx: RT pleural effusion Procedure:   US guided RT thoracentesis Radiologist:  Thornton Papas Anesthesia:  10 ml of 1% lidocaine Specimen:  900 mL of clear yellow colored fluid EBL:   < 1 ml Complications: None

## 2017-11-01 NOTE — Progress Notes (Signed)
PROGRESS NOTE    Glenda Johnson  YQM:[id number]  DOB: 1955/11/11  DOA: 10/31/2017 PCP: [name], MD   Brief Admission Hx: Glenda Johnson is a 62 y.o. female with medical history significant of COPD, CHF comes in with over 3 days of worsening shortness of breath and cough.  Patient does not require home oxygen. Pt was noted to have markedly elevated lactic acid >3 and pneumonia on chest xray.  She was admitted for severe sepsis and pneumonia, COPD exacerbation.   MDM/Assessment & Plan:   1. Severe Sepsis - likely secondary to pneumonia and COPD exacerbation, clinically improving but not back to baseline, continue current treatments.  Repeat CXR.  2. Pneumonia - continue current treatments, check CXR this morning.  3. COPD exacerbation - 6 minute walk ordered.  Continue other treatments.  4. CHF - stable, follow clinically. Resumed home diuretic.  5. CKD stage 3 - stable, following.  6. Chronic Atrial Fibrillation - continue anticoagulation and rate control. Stable.  7. Chronic Dyspnea-  Multifactorial - follow.    DVT prophylaxis: warfarin Code Status: Full  Family Communication: patient  Disposition Plan: continue inpatient medical treatment and work up   Subjective: Pt continues to have SOB.  Denies Chest pain.    Objective: Vitals:   11/01/17 0406 11/01/17 0541 11/01/17 0557 11/01/17 0845  BP:  (!) 169/92    Pulse:  (!) 48    Resp:  20    Temp:  98.3 F (36.8 C)    TempSrc:  Oral    SpO2: (!) 86% 95%  91%  Weight:   59.5 kg (131 lb 2.8 oz)   Height:        Intake/Output Summary (Last 24 hours) at 11/01/2017 0851 Last data filed at 11/01/2017 0600 Gross per 24 hour  Intake 839 ml  Output -  Net 839 ml   Filed Weights   10/31/17 1815 11/01/17 0557  Weight: 61.7 kg (136 lb) 59.5 kg (131 lb 2.8 oz)     REVIEW OF SYSTEMS  As per history otherwise all reviewed and reported negative  Exam:  General exam: awake, alert, NAD. Cooperative.  Respiratory  system: bibasilar expiratory wheezes. No increased work of breathing. Cardiovascular system: S1 & S2 heard.  Gastrointestinal system: Abdomen is nondistended, soft and nontender. Normal bowel sounds heard. Central nervous system: Alert and oriented. No focal neurological deficits. Extremities: no cyanosis.  Data Reviewed: Basic Metabolic Panel: Recent Labs  Lab 10/31/17 1905 11/01/17 0532  NA 135 133*  K 3.3* 3.6  CL 98* 98*  CO2 23 23  GLUCOSE 105* 156*  BUN 12 16  CREATININE 1.38* 1.29*  CALCIUM 9.0 8.8*   Liver Function Tests: No results for input(s): AST, ALT, ALKPHOS, BILITOT, PROT, ALBUMIN in the last 168 hours. No results for input(s): LIPASE, AMYLASE in the last 168 hours. No results for input(s): AMMONIA in the last 168 hours. CBC: Recent Labs  Lab 10/31/17 1905 11/01/17 0532  WBC 7.0 3.6*  NEUTROABS 5.0  --   HGB 10.0* 9.5*  HCT 35.6* 34.1*  MCV 75.9* 76.3*  PLT 344 264   Cardiac Enzymes: No results for input(s): CKTOTAL, CKMB, CKMBINDEX, TROPONINI in the last 168 hours. CBG (last 3)  No results for input(s): GLUCAP in the last 72 hours. No results found for this or any previous visit (from the past 240 hour(s)).   Studies: Dg Chest 2 View  Result Date: 10/31/2017 CLINICAL DATA:  Worsening cough x2 days with dyspnea. EXAM: CHEST -  2 VIEW COMPARISON:  10/10/2017 FINDINGS: Stable heart size and mediastinal contours. Mild-to-moderate aortic atherosclerosis at the arch without aneurysm. Hazy opacities at each lung base consistent with layering pleural effusions with adjacent presumed atelectasis are stable. Superimposed pneumonia would be difficult to entirely exclude due to the confluent opacities noted at each lung base. Slight decrease in pulmonary vascular redistribution is identified on current exam. No suspicious osseous abnormalities. IMPRESSION: Slight interval decrease in pulmonary vascular redistribution. Stable bilateral layering pleural effusions with  presumed atelectasis and/or infiltrates. Electronically Signed   By: Ashley Royalty M.D.   On: 10/31/2017 19:06     Scheduled Meds: . carvedilol  12.5 mg Oral BID WC  . Fluticasone-Umeclidin-Vilant  1 puff Inhalation Daily  . furosemide  40 mg Oral Daily  . guaiFENesin  600 mg Oral BID  . ipratropium-albuterol  3 mL Nebulization Q6H  . LORazepam  0.5 mg Oral Q8H  . mouth rinse  15 mL Mouth Rinse BID  . methylPREDNISolone (SOLU-MEDROL) injection  80 mg Intravenous Q12H  . pantoprazole  40 mg Oral Q1200  . sodium chloride flush  3 mL Intravenous Q12H  . Warfarin - Pharmacist Dosing Inpatient   Does not apply Q24H   Continuous Infusions: . sodium chloride    . azithromycin    . cefTRIAXone (ROCEPHIN)  IV      Principal Problem:   COPD exacerbation (HCC) Active Problems:   CKD (chronic kidney disease) stage 3, GFR 30-59 ml/min (HCC)   Dyspnea   CHF (congestive heart failure) (HCC)   Atrial fibrillation (HCC)   Severe sepsis ([place])   Time spent:   Glenda Brakeman, MD, FAAFP [facility] Pager [phone]  If 7PM-7AM, please contact night-coverage [email or web address] Password [email or web address] 11/01/2017, 8:51 AM    LOS: 0 days

## 2017-11-01 NOTE — Progress Notes (Signed)
CRITICAL VALUE ALERT  Critical Value:  Lactic Acid 2.7  Date & Time Notied:  11/01/17 0630  Provider Notified: Olevia Bowens, MD  Orders Received/Actions taken: Notified via text page,  awaiting return call.

## 2017-11-01 NOTE — Progress Notes (Signed)
ANTICOAGULATION CONSULT NOTE - follow up [place] for Warfarin Indication: atrial fibrillation  Allergies  Allergen Reactions  . Codeine Anaphylaxis  . Tramadol Anaphylaxis    No hydrocodone or anything connected No hydrocodone or anything connected   . Clarithromycin Nausea And Vomiting  . Citalopram Other (See Comments)    Caused more depression   . Fish Allergy Itching    ears  . Other     Patient says that all narcotics give her anaphylaxis.   . Wellbutrin [Bupropion] Other (See Comments)    "could not speak a full sentence for a whole year"    Patient Measurements: Height: 5\' 7"  (170.2 cm) Weight: 131 lb 2.8 oz (59.5 kg) IBW/kg (Calculated) : 61.6 Heparin Dosing Weight:   Vital Signs: Temp: 98.3 F (36.8 C) (05/09 0541) Temp Source: Oral (05/09 0541) BP: 169/92 (05/09 0541) Pulse Rate: 48 (05/09 0541)  Labs: Recent Labs    10/31/17 1905 11/01/17 0532  HGB 10.0* 9.5*  HCT 35.6* 34.1*  PLT 344 264  LABPROT 22.5* 24.3*  INR 2.00 2.21  CREATININE 1.38* 1.29*    Estimated Creatinine Clearance: 41.9 mL/min (A) (by C-G formula based on SCr of 1.29 mg/dL (H)).   Medical History: Past Medical History:  Diagnosis Date  . Atrial fibrillation ([place])   . CHF (congestive heart failure) ([place])   . COPD (chronic obstructive pulmonary disease) ([place])   . Hepatitis C   . Pulmonary thrombosis ([place])   . Renal artery stenosis ([place])   . Secondary hypertension   . Stroke ([place])   . Tobacco abuse     Medications:  Medications Prior to Admission  Medication Sig Dispense Refill Last Dose  . albuterol (PROVENTIL HFA;VENTOLIN HFA) 108 (90 Base) MCG/ACT inhaler Inhale 2 puffs into the lungs every 4 (four) hours as needed.   Past Week at Unknown time  . carvedilol (COREG) 12.5 MG tablet Take 1 tablet (12.5 mg total) by mouth 2 (two) times daily with a meal.   10/30/2017 at 2000  . Fluticasone-Umeclidin-Vilant (TRELEGY ELLIPTA) 100-62.5-25 MCG/INH AEPB Inhale 1  puff into the lungs daily.    10/30/2017 at Unknown time  . furosemide (LASIX) 40 MG tablet Take 1 tablet (40 mg total) by mouth daily. 30 tablet 0 10/30/2017 at Unknown time  . LORazepam (ATIVAN) 0.5 MG tablet Take 0.5 mg by mouth every 8 (eight) hours.   10/31/2017 at Unknown time  . nitroGLYCERIN (NITROSTAT) 0.4 MG SL tablet Place 0.4 mg under the tongue every 5 (five) minutes as needed for chest pain.   unknown  . pantoprazole (PROTONIX) 40 MG tablet Take 1 tablet (40 mg total) by mouth daily at 12 noon.   10/30/2017 at Unknown time  . potassium chloride SA (K-DUR,KLOR-CON) 20 MEQ tablet Take 2 tablets (40 mEq total) by mouth daily. (Patient taking differently: Take 20 mEq by mouth daily. ) 60 tablet 0 10/30/2017 at Unknown time  . warfarin (COUMADIN) 5 MG tablet Take 1 tablet (5 mg total) by mouth daily. (Patient taking differently: Take 2.5 mg by mouth daily. ) 10 tablet 0 10/30/2017 at 8-1000a   See med rec Warfarin PTA, dose not received today.  Assessment: Patient has chronic a fib and on chronic coumadin therapy. INR remains @ goal, no bleeding noted.  Home dose is 2.5mg  daily  Goal of Therapy:  INR 2-3   Plan:  Warfarin 2.5mg  PO x 1 Daily PT/INR Monitor for signs and symptoms of bleeding.   Isac Sarna, BS  Vena Austria, BCPS Clinical Pharmacist Pager [phone] 11/01/2017,9:10 AM

## 2017-11-01 NOTE — Progress Notes (Signed)
CRITICAL VALUE ALERT  Critical Value:  Lactic acid 3.9  Date & Time Notied:  11/01/17 0132  Provider Notified: Olevia Bowens, MD  Orders Received/Actions taken: Recheck Lactic acid at 0500.

## 2017-11-01 NOTE — Care Management Note (Signed)
Case Management Note  Patient Details  Name: Glenda Johnson MRN: [id number] Date of Birth: [birth year]  Subjective/Objective:           Copd exacerbation, pneumonia. Pt from home. Lives alone. Has neighbor who has been staying with her. He has his own home and is not there when she isn't. He does not necessarily do "a lot" but is there if she needs him. She has daughter who lives in [place], is not listed as contact because "she is young and probably wouldn't know what to do". She has sister and cousin also listed. She wants them to continue to be her EC. She does not drive, gets rides from family or friends. She has rollator, not on home oxygen but feels she needs it. Pt did not qualify during her stay last week. Pt was referred for [facility] and the day [facility] nurse came out to admit (4/16) pt was in a-fib and sent to the ED. She did not like them and does not want them to come back. Pt is agreeable to any other HHA.          Action/Plan: Pt will need HH referral and home O2 assessment. Pt very SOB today, not ready for assessment. CM will request [place] order be put in and will contact [employer] to begin authorization process.   Expected Discharge Date:     11/03/17             Expected Discharge Plan:  [place]  In-House Referral:  NA  Discharge planning Services  CM Consult  Post Acute Care Choice:  Home Health, Durable Medical Equipment Choice offered to:     DME Arranged:  Oxygen DME Agency:     HH Arranged:  PT, RN [place] Agency:     Status of Service:  In process, will continue to follow  If discussed at Long Length of Stay Meetings, dates discussed:    Additional Comments:  Sherald Barge, RN 11/01/2017, 1:42 PM

## 2017-11-02 LAB — AMYLASE, PLEURAL OR PERITONEAL FLUID: Amylase, Fluid: 21 U/L

## 2017-11-02 LAB — PROTIME-INR
INR: 2.18
Prothrombin Time: 24.1 seconds — ABNORMAL HIGH (ref 11.4–15.2)

## 2017-11-02 LAB — PATHOLOGIST SMEAR REVIEW

## 2017-11-02 NOTE — Progress Notes (Signed)
Spoke with MD regarding possible diet liberalization as patient has been difficult with dietary ambassadors regarding diet restrictions.   Received verbal order from MD to advance diet to regular in progression meeting  Burtis Junes RD, LDN, [place] Clinical Nutrition Available Tues-Sat via Pager: [phone] 11/02/2017 9:40 AM

## 2017-11-02 NOTE — Progress Notes (Signed)
PROGRESS NOTE  Glenda Johnson  HYW:[id number]  DOB: August 10, 1955  DOA: 10/31/2017 PCP: [name], MD  Brief Admission Hx: Glenda Johnson is a 62 y.o. female with medical history significant of COPD, CHF comes in with over 3 days of worsening shortness of breath and cough.  Patient does not require home oxygen. Pt was noted to have markedly elevated lactic acid >3 and pneumonia on chest xray.  She was admitted for severe sepsis and pneumonia, COPD exacerbation.   MDM/Assessment & Plan:   1. Severe Sepsis - likely secondary to pneumonia and COPD exacerbation, clinically improving but not back to baseline, continue current treatments.  Repeat CXR with persistent effusion and therefore we sent her for thoracentesis.  2. Pneumonia - continue current treatments.  3. Pleural effusion - Pt feels much better after thoracentesis done 5/9 and 900 mL of fluid was removed.     4. COPD exacerbation - 6 minute walk ordered.  Continue other treatments. Home oxygen qualification orders signed.   5. CHF - stable, follow clinically.  Resumed home diuretic.  6. CKD stage 3 - stable, following.  7. Chronic Atrial Fibrillation - continue anticoagulation and rate control. Stable.  8. Chronic Dyspnea-  Multifactorial - follow.   DVT prophylaxis: warfarin being managed by pharmacy team Code Status: Full  Family Communication: patient  Disposition Plan: continue inpatient medical treatment and work up, Pt is hopeful to discharge home 5/11  Subjective: Pt says that she feels much better after the thoracentesis was done.  Much less SOB.  She has been ambulating yet and will work on that today.     Objective: Vitals:   11/02/17 0600 11/02/17 0816 11/02/17 0819 11/02/17 0826  BP:      Pulse:      Resp:      Temp:      TempSrc:      SpO2:  94% 96% 98%  Weight: 59.8 kg (131 lb 13.4 oz)     Height:        Intake/Output Summary (Last 24 hours) at 11/02/2017 1045 Last data filed at 11/02/2017 0900 Gross per  24 hour  Intake 1070 ml  Output 400 ml  Net 670 ml   Filed Weights   10/31/17 1815 11/01/17 0557 11/02/17 0600  Weight: 61.7 kg (136 lb) 59.5 kg (131 lb 2.8 oz) 59.8 kg (131 lb 13.4 oz)   REVIEW OF SYSTEMS  As per history otherwise all reviewed and reported negative  Exam:  General exam: awake, alert, NAD. Cooperative.  Respiratory system: bibasilar expiratory wheezes. Better air movement.  No increased work of breathing. Cardiovascular system: S1 & S2 heard.  Gastrointestinal system: Abdomen is nondistended, soft and nontender. Normal bowel sounds heard. Central nervous system: Alert and oriented. No focal neurological deficits. Extremities: no cyanosis.  Data Reviewed: Basic Metabolic Panel: Recent Labs  Lab 10/31/17 1905 11/01/17 0532  NA 135 133*  K 3.3* 3.6  CL 98* 98*  CO2 23 23  GLUCOSE 105* 156*  BUN 12 16  CREATININE 1.38* 1.29*  CALCIUM 9.0 8.8*   Liver Function Tests: No results for input(s): AST, ALT, ALKPHOS, BILITOT, PROT, ALBUMIN in the last 168 hours. No results for input(s): LIPASE, AMYLASE in the last 168 hours. No results for input(s): AMMONIA in the last 168 hours. CBC: Recent Labs  Lab 10/31/17 1905 11/01/17 0532  WBC 7.0 3.6*  NEUTROABS 5.0  --   HGB 10.0* 9.5*  HCT 35.6* 34.1*  MCV 75.9* 76.3*  PLT 344 264  Cardiac Enzymes: No results for input(s): CKTOTAL, CKMB, CKMBINDEX, TROPONINI in the last 168 hours. CBG (last 3)  No results for input(s): GLUCAP in the last 72 hours. Recent Results (from the past 240 hour(s))  Gram stain     Status: None   Collection Time: 11/01/17  4:47 PM  Result Value Ref Range Status   Specimen Description PLEURAL  Final   Special Requests NONE  Final   Gram Stain   Final    CYTOSPIN SMEAR NO ORGANISMS SEEN WBC PRESENT, PREDOMINANTLY MONONUCLEAR Performed at [facility], [address] [place], [place] [address]    Report Status 11/01/2017 FINAL  Final  Culture, body fluid-bottle     Status: None  (Preliminary result)   Collection Time: 11/01/17  4:47 PM  Result Value Ref Range Status   Specimen Description PLEURAL  Final   Special Requests   Final    BOTTLES DRAWN AEROBIC AND ANAEROBIC Blood Culture adequate volume Performed at [facility], [address] [place], [place] [address]    Culture PENDING  Incomplete   Report Status PENDING  Incomplete     Studies: Dg Chest 1 View  Result Date: 11/01/2017 CLINICAL DATA:  RIGHT pleural effusion post thoracentesis EXAM: CHEST  1 VIEW COMPARISON:  Earlier study of 11/01/2017 FINDINGS: Expiratory technique. Decreased RIGHT pleural effusion post thoracentesis. No pneumothorax. Bibasilar atelectasis. Mild enlargement of cardiac silhouette. Atherosclerotic calcification aorta. Upper lungs clear. BILATERAL breast prostheses with capsular calcification at LEFT prosthesis. Bones demineralized. IMPRESSION: No pneumothorax following RIGHT thoracentesis. Electronically Signed   By: Lavonia Dana M.D.   On: 11/01/2017 17:11   Dg Chest 2 View  Result Date: 10/31/2017 CLINICAL DATA:  Worsening cough x2 days with dyspnea. EXAM: CHEST - 2 VIEW COMPARISON:  10/10/2017 FINDINGS: Stable heart size and mediastinal contours. Mild-to-moderate aortic atherosclerosis at the arch without aneurysm. Hazy opacities at each lung base consistent with layering pleural effusions with adjacent presumed atelectasis are stable. Superimposed pneumonia would be difficult to entirely exclude due to the confluent opacities noted at each lung base. Slight decrease in pulmonary vascular redistribution is identified on current exam. No suspicious osseous abnormalities. IMPRESSION: Slight interval decrease in pulmonary vascular redistribution. Stable bilateral layering pleural effusions with presumed atelectasis and/or infiltrates. Electronically Signed   By: Ashley Royalty M.D.   On: 10/31/2017 19:06   Dg Chest Port 1 View  Result Date: 11/01/2017 CLINICAL DATA:  Sepsis. EXAM: PORTABLE  CHEST 1 VIEW COMPARISON:  Radiographs of Oct 31, 2017. FINDINGS: Stable cardiomediastinal silhouette. Stable moderate bibasilar edema or atelectasis is noted with moderate pleural effusions. No pneumothorax is noted. Bony thorax is unremarkable. IMPRESSION: Stable bilateral pleural effusions with associated atelectasis or edema. Electronically Signed   By: Marijo Conception, M.D.   On: 11/01/2017 11:42   US Thoracentesis Asp Pleural Space W/img Guide  Result Date: 11/01/2017 INDICATION: RIGHT pleural effusion, shortness of breath, history atrial fibrillation, CHF, COPD, stroke, former smoker EXAM: ULTRASOUND GUIDED DIAGNOSTIC AND THERAPEUTIC RIGHT THORACENTESIS MEDICATIONS: None. COMPLICATIONS: None immediate. PROCEDURE: Procedure, benefits, and risks of procedure were discussed with patient. Written informed consent for procedure was obtained. Time out protocol followed. Pleural effusion localized by ultrasound at the posterior RIGHT hemithorax. Skin prepped and draped in usual sterile fashion. Skin and soft tissues anesthetized with 10 mL of 1% lidocaine. 8 French thoracentesis catheter placed into the RIGHT pleural space. 900 mL of clear yellow fluid aspirated by syringe pump. Procedure tolerated well by patient without immediate complication. FINDINGS: A  total of approximately 900 mL of RIGHT pleural fluid was removed. Samples were sent to the laboratory as requested by the clinical team. IMPRESSION: Successful ultrasound guided RIGHT thoracentesis yielding 900 mL of pleural fluid. Electronically Signed   By: Lavonia Dana M.D.   On: 11/01/2017 17:19     Scheduled Meds: . budesonide (PULMICORT) nebulizer solution  0.25 mg Nebulization BID  . carvedilol  12.5 mg Oral BID WC  . furosemide  40 mg Oral Daily  . guaiFENesin  600 mg Oral BID  . ipratropium-albuterol  3 mL Nebulization Q6H  . LORazepam  0.5 mg Oral Q8H  . mouth rinse  15 mL Mouth Rinse BID  . methylPREDNISolone (SOLU-MEDROL) injection  80 mg  Intravenous Q8H  . pantoprazole  40 mg Oral Q1200  . sodium chloride flush  3 mL Intravenous Q12H  . umeclidinium-vilanterol  1 puff Inhalation Daily  . Warfarin - Pharmacist Dosing Inpatient   Does not apply Q24H   Continuous Infusions: . sodium chloride    . azithromycin 500 mg (11/01/17 2333)  . cefTRIAXone (ROCEPHIN)  IV Stopped (11/01/17 1844)    Principal Problem:   COPD exacerbation (HCC) Active Problems:   CKD (chronic kidney disease) stage 3, GFR 30-59 ml/min (HCC)   Dyspnea   CHF (congestive heart failure) (HCC)   Atrial fibrillation (HCC)   Severe sepsis (HCC)   Tobacco abuse   Renal artery stenosis ([place])   Stroke ([place])   Secondary hypertension   Time spent:   Irwin Brakeman, MD, FAAFP [facility] Pager [phone]  If 7PM-7AM, please contact night-coverage [email or web address] Password [email or web address] 11/02/2017, 10:45 AM    LOS: 1 day

## 2017-11-02 NOTE — Care Management Note (Addendum)
Case Management Note  Patient Details  Name: Glenda Johnson MRN: [id number] Date of Birth: 03/08/1956   Expected Discharge Date:     11/03/2017             Expected Discharge Plan:  [place]  In-House Referral:  NA  Discharge planning Services  CM Consult  Post Acute Care Choice:  Home Health, Durable Medical Equipment Choice offered to:     DME Arranged:  Oxygen DME Agency:     HH Arranged:  PT, RN [place] Agency:     Status of Service:  In process, will continue to follow  Additional Comments: Referral for home O2 and HH RN/PT submitted to [facility] (f) [phone]; Intake # [id number]. Start date for home oxygen is 11/03/17 - port tank should be delivered to hospital. Start date for [facility] is 5/12 or 5/13. CM requested HHA not be [facility] per pt request.   Addemdum: [id number] Per [facility] They have not been able to secure [facility] services for pt at [place]. They have contacted a total of [place] providers both contracted and non-contracted. Providers include [facility], [facility], [facility], [facility], [facility] [place], [place], [facility], and [facility]. CM has reached out to [facility] and [facility] personally. CM will cont to secure [facility] provider.    Sherald Barge, RN 11/02/2017, 11:01 AM

## 2017-11-02 NOTE — Progress Notes (Signed)
SATURATION QUALIFICATIONS: (This note is used to comply with regulatory documentation for home oxygen)  Patient Saturations on Room Air at Rest = 94%  Patient Saturations on Room Air while Ambulating = 87-88%  Patient Saturations on 2 Liters of oxygen while Ambulating = 90-94%  Please briefly explain why patient needs home oxygen:pt SATs dropped to 87-88 within 2 minutes of walking without oxygen.

## 2017-11-02 NOTE — Progress Notes (Signed)
ANTICOAGULATION CONSULT NOTE - follow up [place] for Warfarin Indication: atrial fibrillation  Allergies  Allergen Reactions  . Codeine Anaphylaxis  . Tramadol Anaphylaxis    No hydrocodone or anything connected No hydrocodone or anything connected   . Clarithromycin Nausea And Vomiting  . Citalopram Other (See Comments)    Caused more depression   . Fish Allergy Itching    ears  . Other     Patient says that all narcotics give her anaphylaxis.   . Wellbutrin [Bupropion] Other (See Comments)    "could not speak a full sentence for a whole year"    Patient Measurements: Height: 5\' 7"  (170.2 cm) Weight: 131 lb 13.4 oz (59.8 kg) IBW/kg (Calculated) : 61.6 Heparin Dosing Weight:   Vital Signs: Temp: 97.6 F (36.4 C) (05/10 0546) Temp Source: Oral (05/10 0546) BP: 146/78 (05/10 0546) Pulse Rate: 76 (05/10 0546)  Labs: Recent Labs    10/31/17 1905 11/01/17 0532 11/02/17 0701  HGB 10.0* 9.5*  --   HCT 35.6* 34.1*  --   PLT 344 264  --   LABPROT 22.5* 24.3* 24.1*  INR 2.00 2.21 2.18  CREATININE 1.38* 1.29*  --     Estimated Creatinine Clearance: 42.1 mL/min (A) (by C-G formula based on SCr of 1.29 mg/dL (H)).  Assessment: Patient has chronic a fib and on chronic coumadin therapy. INR remains @ goal, no bleeding noted.  Home dose is 2.5mg  daily  Goal of Therapy:  INR 2-3   Plan:  Warfarin 2.5mg  PO x 1 Daily PT/INR Monitor for signs and symptoms of bleeding.   Pricilla Larsson, RPH  11/02/2017,10:33 AM

## 2017-11-03 DIAGNOSIS — I5033 Acute on chronic diastolic (congestive) heart failure: Secondary | ICD-10-CM

## 2017-11-03 LAB — PROTIME-INR
INR: 1.93
PROTHROMBIN TIME: 21.9 s — AB (ref 11.4–15.2)

## 2017-11-03 MED ORDER — WARFARIN SODIUM 2 MG PO TABS
4.0000 mg | ORAL_TABLET | Freq: Once | ORAL | Status: AC
  Administered 2017-11-03: 4 mg via ORAL
  Filled 2017-11-03: qty 2

## 2017-11-03 MED ORDER — AZITHROMYCIN 250 MG PO TABS
500.0000 mg | ORAL_TABLET | Freq: Every day | ORAL | Status: DC
  Administered 2017-11-03 – 2017-11-05 (×3): 500 mg via ORAL
  Filled 2017-11-03 (×3): qty 2

## 2017-11-03 MED ORDER — FUROSEMIDE 10 MG/ML IJ SOLN
40.0000 mg | Freq: Two times a day (BID) | INTRAMUSCULAR | Status: DC
  Administered 2017-11-03 – 2017-11-05 (×4): 40 mg via INTRAVENOUS
  Filled 2017-11-03 (×5): qty 4

## 2017-11-03 MED ORDER — METHYLPREDNISOLONE SODIUM SUCC 125 MG IJ SOLR
80.0000 mg | Freq: Two times a day (BID) | INTRAMUSCULAR | Status: DC
  Administered 2017-11-04 (×2): 80 mg via INTRAVENOUS
  Filled 2017-11-03 (×2): qty 2

## 2017-11-03 NOTE — Plan of Care (Signed)
Encourage pt to cough and deep breath. Encourage use of incentive spironometer.

## 2017-11-03 NOTE — Progress Notes (Signed)
PROGRESS NOTE  Glenda Johnson  NGE:[id number]  DOB: 11/05/1955  DOA: 10/31/2017 PCP: [name], MD  Brief Admission Hx: Glenda Johnson is a 62 y.o. female with medical history significant of COPD, CHF comes in with over 3 days of worsening shortness of breath and cough.  Patient does not require home oxygen. Pt was noted to have markedly elevated lactic acid >3 and pneumonia on chest xray.  She was admitted for severe sepsis and pneumonia, COPD exacerbation.   MDM/Assessment & Plan:   1. Severe Sepsis -patient is now afebrile.  Hemodynamics appear to be stable.  No leukocytosis.  Currently on IV antibiotics. 2. Pneumonia -currently on ceftriaxone and azithromycin. 3. Pleural effusion -patient had thoracentesis on 5/9 with removal of 900 mils of fluid.  Fluid analysis indicates transudate of process.  Suspect this is related to decompensated CHF.  Start on IV diuretics.     4. COPD exacerbation -currently on intravenous steroids, bronchodilators and antibiotics.  Will wean steroids as tolerated. 5. Acute on chronic diastolic congestive heart failure.  With evidence of pleural effusions, suspect that decompensated CHF is playing a role in her shortness of breath.  Will change oral Lasix to IV.  Monitor urine output.    6. CKD stage 3 - stable, following.  7. Chronic Atrial Fibrillation - continue anticoagulation and rate control. Stable.  8. Chronic Dyspnea-  Multifactorial - follow.   DVT prophylaxis: warfarin being managed by pharmacy team Code Status: Full  Family Communication: patient  Disposition Plan: Discharge home once respiratory status has improved  Subjective: Patient still feels short of breath.  No chest pain     Objective: Vitals:   11/03/17 0444 11/03/17 0744 11/03/17 1323 11/03/17 1348  BP: (!) 119/92  (!) 155/86   Pulse: (!) 130  84   Resp:   18   Temp: 97.8 F (36.6 C)     TempSrc: Oral     SpO2: 95% 94% 90% 90%  Weight: 60.6 kg (133 lb 9.6 oz)     Height:         Intake/Output Summary (Last 24 hours) at 11/03/2017 1738 Last data filed at 11/02/2017 1752 Gross per 24 hour  Intake 120 ml  Output 200 ml  Net -80 ml   Filed Weights   11/01/17 0557 11/02/17 0600 11/03/17 0444  Weight: 59.5 kg (131 lb 2.8 oz) 59.8 kg (131 lb 13.4 oz) 60.6 kg (133 lb 9.6 oz)   REVIEW OF SYSTEMS  As per history otherwise all reviewed and reported negative  Exam:  General exam: Alert, awake, oriented x 3 Respiratory system: Bibasilar crackles.  Mildly increased respiratory effort Cardiovascular system: Irregular. No murmurs, rubs, gallops. Gastrointestinal system: Abdomen is nondistended, soft and nontender. No organomegaly or masses felt. Normal bowel sounds heard. Central nervous system: Alert and oriented. No focal neurological deficits. Extremities: No C/C/E, +pedal pulses Skin: No rashes, lesions or ulcers Psychiatry: Judgement and insight appear normal. Mood & affect appropriate.   Data Reviewed: Basic Metabolic Panel: Recent Labs  Lab 10/31/17 1905 11/01/17 0532  NA 135 133*  K 3.3* 3.6  CL 98* 98*  CO2 23 23  GLUCOSE 105* 156*  BUN 12 16  CREATININE 1.38* 1.29*  CALCIUM 9.0 8.8*   Liver Function Tests: No results for input(s): AST, ALT, ALKPHOS, BILITOT, PROT, ALBUMIN in the last 168 hours. No results for input(s): LIPASE, AMYLASE in the last 168 hours. No results for input(s): AMMONIA in the last 168 hours. CBC: Recent Labs  Lab 10/31/17 1905 11/01/17 0532  WBC 7.0 3.6*  NEUTROABS 5.0  --   HGB 10.0* 9.5*  HCT 35.6* 34.1*  MCV 75.9* 76.3*  PLT 344 264   Cardiac Enzymes: No results for input(s): CKTOTAL, CKMB, CKMBINDEX, TROPONINI in the last 168 hours. CBG (last 3)  No results for input(s): GLUCAP in the last 72 hours. Recent Results (from the past 240 hour(s))  Gram stain     Status: None   Collection Time: 11/01/17  4:47 PM  Result Value Ref Range Status   Specimen Description PLEURAL  Final   Special Requests NONE   Final   Gram Stain   Final    CYTOSPIN SMEAR NO ORGANISMS SEEN WBC PRESENT, PREDOMINANTLY MONONUCLEAR Performed at [facility], [address] [place], [place] [address]    Report Status 11/01/2017 FINAL  Final  Culture, body fluid-bottle     Status: None (Preliminary result)   Collection Time: 11/01/17  4:47 PM  Result Value Ref Range Status   Specimen Description PLEURAL  Final   Special Requests   Final    BOTTLES DRAWN AEROBIC AND ANAEROBIC Blood Culture adequate volume   Culture   Final    NO GROWTH 2 DAYS Performed at [facility], [address] [place], [place] [address]    Report Status PENDING  Incomplete     Studies: No results found.   Scheduled Meds: . azithromycin  500 mg Oral QHS  . budesonide (PULMICORT) nebulizer solution  0.25 mg Nebulization BID  . carvedilol  12.5 mg Oral BID WC  . furosemide  40 mg Oral Daily  . guaiFENesin  600 mg Oral BID  . ipratropium-albuterol  3 mL Nebulization Q6H  . LORazepam  0.5 mg Oral Q8H  . mouth rinse  15 mL Mouth Rinse BID  . methylPREDNISolone (SOLU-MEDROL) injection  80 mg Intravenous Q8H  . pantoprazole  40 mg Oral Q1200  . sodium chloride flush  3 mL Intravenous Q12H  . umeclidinium-vilanterol  1 puff Inhalation Daily  . Warfarin - Pharmacist Dosing Inpatient   Does not apply Q24H   Continuous Infusions: . sodium chloride    . cefTRIAXone (ROCEPHIN)  IV Stopped (11/02/17 2212)    Principal Problem:   COPD exacerbation (HCC) Active Problems:   CKD (chronic kidney disease) stage 3, GFR 30-59 ml/min (HCC)   Dyspnea   CHF (congestive heart failure) (HCC)   Atrial fibrillation (HCC)   Severe sepsis (HCC)   Tobacco abuse   Renal artery stenosis ([place])   Stroke ([place])   Secondary hypertension   Time spent:   Kathie Dike, MD [facility] Pager [phone]  If 7PM-7AM, please contact night-coverage [email or web address] Password [email or web address] 11/03/2017, 5:38 PM    LOS: 2 days

## 2017-11-03 NOTE — Progress Notes (Signed)
ANTICOAGULATION CONSULT NOTE - follow up [place] for Warfarin Indication: atrial fibrillation  Allergies  Allergen Reactions  . Codeine Anaphylaxis  . Tramadol Anaphylaxis    No hydrocodone or anything connected No hydrocodone or anything connected   . Clarithromycin Nausea And Vomiting  . Citalopram Other (See Comments)    Caused more depression   . Fish Allergy Itching    ears  . Other     Patient says that all narcotics give her anaphylaxis.   . Wellbutrin [Bupropion] Other (See Comments)    "could not speak a full sentence for a whole year"    Patient Measurements: Height: 5\' 7"  (170.2 cm) Weight: 133 lb 9.6 oz (60.6 kg) IBW/kg (Calculated) : 61.6 Heparin Dosing Weight:   Vital Signs: Temp: 97.8 F (36.6 C) (05/11 0444) Temp Source: Oral (05/11 0444) BP: 119/92 (05/11 0444) Pulse Rate: 130 (05/11 0444)  Labs: Recent Labs    10/31/17 1905 11/01/17 0532 11/02/17 0701 11/03/17 0447  HGB 10.0* 9.5*  --   --   HCT 35.6* 34.1*  --   --   PLT 344 264  --   --   LABPROT 22.5* 24.3* 24.1* 21.9*  INR 2.00 2.21 2.18 1.93  CREATININE 1.38* 1.29*  --   --     Estimated Creatinine Clearance: 42.7 mL/min (A) (by C-G formula based on SCr of 1.29 mg/dL (H)).  Assessment: Patient has chronic a fib and on chronic coumadin therapy. INR slightly below goal, no bleeding noted.  Home dose is 2.5mg  daily.  Dose not given 5/10.  Goal of Therapy:  INR 2-3   Plan:  Warfarin 4mg  PO x 1 today. Daily PT/INR Monitor for signs and symptoms of bleeding.   Pricilla Larsson, [facility]  11/03/2017,8:37 AM   PHARMACIST - PHYSICIAN COMMUNICATION DR:   TRH CONCERNING: Antibiotic IV to Oral Route Change Policy  RECOMMENDATION: This patient is receiving Azithromycin by the intravenous route.  Based on criteria approved by the Pharmacy and Therapeutics Committee, the antibiotic(s) is/are being converted to the equivalent oral dose form(s).   DESCRIPTION: These  criteria include:  Patient being treated for a respiratory tract infection, urinary tract infection, cellulitis or clostridium difficile associated diarrhea if on metronidazole  The patient is not neutropenic and does not exhibit a GI malabsorption state  The patient is eating (either orally or via tube) and/or has been taking other orally administered medications for a least 24 hours  The patient is improving clinically and has a Tmax < 100.5  If you have questions about this conversion, please contact the Pharmacy Department  [x]   [phone] )  [facility] []   [phone] )  [facility] []   [phone] )  [facility] []   [phone] )  [facility] []   [phone] )  [place], [place], [facility] 11/03/2017 8:45 AM

## 2017-11-04 LAB — BASIC METABOLIC PANEL
Anion gap: 10 (ref 5–15)
BUN: 26 mg/dL — AB (ref 6–20)
CHLORIDE: 98 mmol/L — AB (ref 101–111)
CO2: 28 mmol/L (ref 22–32)
CREATININE: 1.17 mg/dL — AB (ref 0.44–1.00)
Calcium: 8.7 mg/dL — ABNORMAL LOW (ref 8.9–10.3)
GFR calc Af Amer: 56 mL/min — ABNORMAL LOW (ref >60)
GFR, EST NON AFRICAN AMERICAN: 49 mL/min — AB (ref >60)
GLUCOSE: 166 mg/dL — AB (ref 65–99)
POTASSIUM: 2.7 mmol/L — AB (ref 3.5–5.1)
Sodium: 136 mmol/L (ref 135–145)

## 2017-11-04 LAB — PROTIME-INR
INR: 1.75
Prothrombin Time: 20.3 seconds — ABNORMAL HIGH (ref 11.4–15.2)

## 2017-11-04 LAB — MAGNESIUM: MAGNESIUM: 2 mg/dL (ref 1.7–2.4)

## 2017-11-04 MED ORDER — METHYLPREDNISOLONE SODIUM SUCC 40 MG IJ SOLR
40.0000 mg | Freq: Two times a day (BID) | INTRAMUSCULAR | Status: DC
  Administered 2017-11-05 (×2): 40 mg via INTRAVENOUS
  Filled 2017-11-04 (×2): qty 1

## 2017-11-04 MED ORDER — WARFARIN SODIUM 2 MG PO TABS
4.0000 mg | ORAL_TABLET | Freq: Once | ORAL | Status: AC
  Administered 2017-11-04: 4 mg via ORAL
  Filled 2017-11-04: qty 2

## 2017-11-04 MED ORDER — POTASSIUM CHLORIDE CRYS ER 20 MEQ PO TBCR
40.0000 meq | EXTENDED_RELEASE_TABLET | ORAL | Status: AC
  Administered 2017-11-04 (×3): 40 meq via ORAL
  Filled 2017-11-04 (×3): qty 2

## 2017-11-04 NOTE — Progress Notes (Signed)
CRITICAL VALUE ALERT  Critical Value:  Potassium 2.7  Date & Time Notied:  11/04/17 0745  Provider Notified: Roderic Palau   Orders Received/Actions taken:

## 2017-11-04 NOTE — Progress Notes (Signed)
ANTICOAGULATION CONSULT NOTE - follow up [name] for Warfarin Indication: atrial fibrillation  Allergies  Allergen Reactions  . Codeine Anaphylaxis  . Tramadol Anaphylaxis    No hydrocodone or anything connected No hydrocodone or anything connected   . Clarithromycin Nausea And Vomiting  . Citalopram Other (See Comments)    Caused more depression   . Fish Allergy Itching    ears  . Other     Patient says that all narcotics give her anaphylaxis.   . Wellbutrin [Bupropion] Other (See Comments)    "could not speak a full sentence for a whole year"    Patient Measurements: Height: 5\' 7"  (170.2 cm) Weight: 132 lb 7.9 oz (60.1 kg) IBW/kg (Calculated) : 61.6 Heparin Dosing Weight:   Vital Signs: Temp: 97.6 F (36.4 C) (05/12 0627) Temp Source: Oral (05/12 0627) BP: 168/92 (05/12 0627) Pulse Rate: 80 (05/12 0627)  Labs: Recent Labs    11/02/17 0701 11/03/17 0447 11/04/17 0617  LABPROT 24.1* 21.9* 20.3*  INR 2.18 1.93 1.75  CREATININE  --   --  1.17*    Estimated Creatinine Clearance: 46.7 mL/min (A) (by C-G formula based on SCr of 1.17 mg/dL (H)).  Assessment: Patient has chronic a fib and on chronic coumadin therapy. INR slightly below goal, no bleeding noted.  Home dose is 2.5mg  daily.  Goal of Therapy:  INR 2-3   Plan:  Repeat Warfarin 4mg  PO x 1 today. Daily PT/INR Monitor for signs and symptoms of bleeding.   Pricilla Larsson, [facility]  11/04/2017,8:47 AM

## 2017-11-04 NOTE — Progress Notes (Signed)
PROGRESS NOTE  Glenda Johnson  YQM:[id number]  DOB: 1956/03/16  DOA: 10/31/2017 PCP: [name], MD  Brief Admission Hx: Glenda Johnson is a 62 y.o. female with medical history significant of COPD, CHF comes in with over 3 days of worsening shortness of breath and cough.  Patient does not require home oxygen. Pt was noted to have markedly elevated lactic acid >3 and pneumonia on chest xray.  She was admitted for severe sepsis and pneumonia, COPD exacerbation.   MDM/Assessment & Plan:   1. Severe Sepsis -patient is now afebrile.  Hemodynamics appear to be stable.  No leukocytosis.  Currently on IV antibiotics. 2. Pneumonia -currently on ceftriaxone and azithromycin.  Can likely discontinue antibiotics after tomorrow's dose 3. Pleural effusion -patient had thoracentesis on 5/9 with removal of 900 mils of fluid.  Fluid analysis indicates transudate of process.  Suspect this is related to decompensated CHF.  Continue IV diuretics.     4. COPD exacerbation -currently on intravenous steroids, bronchodilators and antibiotics.  Will wean steroids as tolerated. 5. Acute on chronic diastolic congestive heart failure.  With evidence of pleural effusions, suspect that decompensated CHF is playing a role in her shortness of breath.  Currently on IV Lasix.  Urine output has not been accurately recorded.  Renal function appears to be improving with diuresis.  Monitor urine output.  Continue current treatments 6. CKD stage 3 - stable, following.  Creatinine improving with diuresis 7. Chronic Atrial Fibrillation - continue anticoagulation and rate control. Stable.   DVT prophylaxis: warfarin being managed by pharmacy team Code Status: Full  Family Communication: patient  Disposition Plan: Discharge home once respiratory status has improved  Subjective: Feels her breathing is slowly improving.  She does have a cough which is minimally productive.  Overall feels generally weak.  Objective: Vitals:   11/03/17 2051 11/04/17 0627 11/04/17 0741 11/04/17 1345  BP: (!) 148/78 (!) 168/92    Pulse: 74 80    Resp: (!) 24     Temp: (!) 97.5 F (36.4 C) 97.6 F (36.4 C)    TempSrc: Oral Oral    SpO2: 95% 98% 94% (!) 87%  Weight:  60.1 kg (132 lb 7.9 oz)    Height:        Intake/Output Summary (Last 24 hours) at 11/04/2017 1703 Last data filed at 11/04/2017 0900 Gross per 24 hour  Intake 343 ml  Output -  Net 343 ml   Filed Weights   11/02/17 0600 11/03/17 0444 11/04/17 0627  Weight: 59.8 kg (131 lb 13.4 oz) 60.6 kg (133 lb 9.6 oz) 60.1 kg (132 lb 7.9 oz)   REVIEW OF SYSTEMS  As per history otherwise all reviewed and reported negative  Exam:  General exam: Alert, awake, oriented x 3 Respiratory system: Bibasilar crackles. Respiratory effort normal. Cardiovascular system: Irregular. No murmurs, rubs, gallops. Gastrointestinal system: Abdomen is nondistended, soft and nontender. No organomegaly or masses felt. Normal bowel sounds heard. Central nervous system: Alert and oriented. No focal neurological deficits. Extremities: No C/C/E, +pedal pulses Skin: No rashes, lesions or ulcers Psychiatry: Judgement and insight appear normal. Mood & affect appropriate.     Data Reviewed: Basic Metabolic Panel: Recent Labs  Lab 10/31/17 1905 11/01/17 0532 11/04/17 0617  NA 135 133* 136  K 3.3* 3.6 2.7*  CL 98* 98* 98*  CO2 23 23 28   GLUCOSE 105* 156* 166*  BUN 12 16 26*  CREATININE 1.38* 1.29* 1.17*  CALCIUM 9.0 8.8* 8.7*  MG  --   --  2.0   Liver Function Tests: No results for input(s): AST, ALT, ALKPHOS, BILITOT, PROT, ALBUMIN in the last 168 hours. No results for input(s): LIPASE, AMYLASE in the last 168 hours. No results for input(s): AMMONIA in the last 168 hours. CBC: Recent Labs  Lab 10/31/17 1905 11/01/17 0532  WBC 7.0 3.6*  NEUTROABS 5.0  --   HGB 10.0* 9.5*  HCT 35.6* 34.1*  MCV 75.9* 76.3*  PLT 344 264   Cardiac Enzymes: No results for input(s): CKTOTAL,  CKMB, CKMBINDEX, TROPONINI in the last 168 hours. CBG (last 3)  No results for input(s): GLUCAP in the last 72 hours. Recent Results (from the past 240 hour(s))  Gram stain     Status: None   Collection Time: 11/01/17  4:47 PM  Result Value Ref Range Status   Specimen Description PLEURAL  Final   Special Requests NONE  Final   Gram Stain   Final    CYTOSPIN SMEAR NO ORGANISMS SEEN WBC PRESENT, PREDOMINANTLY MONONUCLEAR Performed at [facility], [address], [place], [place] [address]    Report Status 11/01/2017 FINAL  Final  Culture, body fluid-bottle     Status: None (Preliminary result)   Collection Time: 11/01/17  4:47 PM  Result Value Ref Range Status   Specimen Description PLEURAL  Final   Special Requests   Final    BOTTLES DRAWN AEROBIC AND ANAEROBIC Blood Culture adequate volume   Culture   Final    NO GROWTH 3 DAYS Performed at [facility], [address] [place], [place] [address]    Report Status PENDING  Incomplete     Studies: No results found.   Scheduled Meds: . azithromycin  500 mg Oral QHS  . budesonide (PULMICORT) nebulizer solution  0.25 mg Nebulization BID  . carvedilol  12.5 mg Oral BID WC  . furosemide  40 mg Intravenous BID  . guaiFENesin  600 mg Oral BID  . ipratropium-albuterol  3 mL Nebulization Q6H  . LORazepam  0.5 mg Oral Q8H  . mouth rinse  15 mL Mouth Rinse BID  . methylPREDNISolone (SOLU-MEDROL) injection  80 mg Intravenous Q12H  . pantoprazole  40 mg Oral Q1200  . sodium chloride flush  3 mL Intravenous Q12H  . umeclidinium-vilanterol  1 puff Inhalation Daily  . Warfarin - Pharmacist Dosing Inpatient   Does not apply Q24H   Continuous Infusions: . sodium chloride    . cefTRIAXone (ROCEPHIN)  IV Stopped (11/03/17 2027)    Principal Problem:   COPD exacerbation (HCC) Active Problems:   CKD (chronic kidney disease) stage 3, GFR 30-59 ml/min (HCC)   Dyspnea   CHF (congestive heart failure) (HCC)   Atrial fibrillation (HCC)    Severe sepsis (HCC)   Tobacco abuse   Renal artery stenosis ([place])   Stroke ([place])   Secondary hypertension   Kathie Dike, MD [facility] Pager [phone]  If 7PM-7AM, please contact night-coverage [email or web address] Password [email or web address] 11/04/2017, 5:03 PM    LOS: 3 days

## 2017-11-05 LAB — BASIC METABOLIC PANEL
Anion gap: 9 (ref 5–15)
BUN: 31 mg/dL — ABNORMAL HIGH (ref 6–20)
CHLORIDE: 100 mmol/L — AB (ref 101–111)
CO2: 25 mmol/L (ref 22–32)
Calcium: 8.6 mg/dL — ABNORMAL LOW (ref 8.9–10.3)
Creatinine, Ser: 1.19 mg/dL — ABNORMAL HIGH (ref 0.44–1.00)
GFR calc non Af Amer: 48 mL/min — ABNORMAL LOW (ref >60)
GFR, EST AFRICAN AMERICAN: 55 mL/min — AB (ref >60)
Glucose, Bld: 215 mg/dL — ABNORMAL HIGH (ref 65–99)
POTASSIUM: 4.1 mmol/L (ref 3.5–5.1)
SODIUM: 134 mmol/L — AB (ref 135–145)

## 2017-11-05 LAB — PROTIME-INR
INR: 1.89
Prothrombin Time: 21.5 seconds — ABNORMAL HIGH (ref 11.4–15.2)

## 2017-11-05 MED ORDER — AMLODIPINE BESYLATE 5 MG PO TABS
5.0000 mg | ORAL_TABLET | Freq: Every day | ORAL | Status: DC
  Filled 2017-11-05: qty 1

## 2017-11-05 MED ORDER — FUROSEMIDE 40 MG PO TABS: 40.0000 mg | ORAL_TABLET | Freq: Two times a day (BID) | ORAL | 0 refills | Status: AC

## 2017-11-05 MED ORDER — HYDRALAZINE HCL 25 MG PO TABS
25.0000 mg | ORAL_TABLET | Freq: Three times a day (TID) | ORAL | Status: DC
  Administered 2017-11-05 – 2017-11-06 (×3): 25 mg via ORAL
  Filled 2017-11-05 (×3): qty 1

## 2017-11-05 MED ORDER — PREDNISONE 10 MG PO TABS: ORAL_TABLET | ORAL | 0 refills | Status: DC

## 2017-11-05 MED ORDER — HYDRALAZINE HCL 25 MG PO TABS: 25.0000 mg | ORAL_TABLET | Freq: Three times a day (TID) | ORAL | 0 refills | Status: DC

## 2017-11-05 MED ORDER — GUAIFENESIN ER 600 MG PO TB12: 600.0000 mg | ORAL_TABLET | Freq: Two times a day (BID) | ORAL | 2 refills | Status: DC

## 2017-11-05 MED ORDER — ALBUTEROL SULFATE (2.5 MG/3ML) 0.083% IN NEBU: 2.5000 mg | INHALATION_SOLUTION | Freq: Four times a day (QID) | RESPIRATORY_TRACT | 12 refills | Status: AC | PRN

## 2017-11-05 MED ORDER — AMLODIPINE BESYLATE 5 MG PO TABS: 5.0000 mg | ORAL_TABLET | Freq: Every day | ORAL | 0 refills | Status: DC

## 2017-11-05 MED ORDER — WARFARIN SODIUM 2 MG PO TABS
4.0000 mg | ORAL_TABLET | Freq: Once | ORAL | Status: AC
  Administered 2017-11-05: 4 mg via ORAL
  Filled 2017-11-05: qty 2

## 2017-11-05 NOTE — Care Management Note (Addendum)
Case Management Note  Patient Details  Name: Glenda Johnson MRN: [id number] Date of Birth: 26-Oct-1955  Expected Discharge Date:      11/05/17            Expected Discharge Plan:  [place]  In-House Referral:  NA  Discharge planning Services  CM Consult  Post Acute Care Choice:  Home Health, Durable Medical Equipment Choice offered to:     DME Arranged:  Oxygen DME Agency:  [facility]  HH Arranged:  PT, RN [place] Agency:  [facility]  Status of Service:  Completed, signed off  If discussed at [place] of Stay Meetings, dates discussed:    Additional Comments: Anticipate DC home today. [facility] has sent oxygen referral to [employer], they will deliver port tank to pt room prior ot DC. [facility] has agreed to accept referral. Rep aware of DC planned for today. Pt aware HH has 48 hrs to make first visit.   Addendum: 8638 CM has called [employer] back. Assessment and O2 order needed changes made. CM has made corrections and re-faxed documentation. Emphasized pt need DME to be ready for DC today. [employer] ensured same day delivery. [employer] to contact pt about delivery. Left number for [employer] to call CM back if they need more documentation.   Addendum 1600: MD has ordered neb machine. CM has sent order to [employer] to be delivered with home O2. CM has called to notify [employer] that order for neb has been sent but can not get through to [employer]'s call center. CM has contacted [facility] to update order. Pt aware neb machine may not arrive with oxygen.    Sherald Barge, RN 11/05/2017, 1:58 PM

## 2017-11-05 NOTE — Discharge Summary (Signed)
Physician Discharge Summary  Glenda Johnson BJS:[id number] DOB: [birth year] DOA: 10/31/2017  PCP: [name], MD  Admit date: 10/31/2017 Discharge date: 11/05/2017  Admitted From: Home Disposition: Home  Recommendations for Outpatient Follow-up:  1. Follow up with PCP in 1-2 weeks 2. Please obtain BMP/CBC in one week 3. Repeat chest x-ray in 4 weeks to ensure resolution of pleural effusions.  Home Health: Home health RN, PT.  Patient was offered physical therapy evaluation in the hospital to see if she is a candidate for rehab, but patient refused any sort of placement. Equipment/Devices: Oxygen at 2 L, nebulizer machine  Discharge Condition: Stable CODE STATUS: Full code Diet recommendation: Heart Healthy  Brief/Interim Summary: Glenda Murphyis a 62 y.o.femalewith medical history significant ofCOPD, CHF comes in with over 3 days of worsening shortness of breath and cough. Patient does not require home oxygen. Pt was noted to have markedly elevated lactic acid >3 and pneumonia on chest xray.  She was admitted for severe sepsis and pneumonia, COPD exacerbation.     Discharge Diagnoses:  Principal Problem:   COPD exacerbation ([place]) Active Problems:   CKD (chronic kidney disease) stage 3, GFR 30-59 ml/min (HCC)   Dyspnea   CHF (congestive heart failure) (HCC)   Atrial fibrillation (HCC)   Severe sepsis (HCC)   Tobacco abuse   Renal artery stenosis ([place])   Stroke ([place])   Secondary hypertension  1. Severe Sepsis -Resolved.  Patient is now afebrile.  Hemodynamics appear to be stable.  No leukocytosis.  She was treated with intravenous antibiotics. 2. Pneumonia -treated with ceftriaxone and azithromycin.   She completed a 5-day course and is currently afebrile. 3. Pleural effusion -patient had thoracentesis on 5/9 with removal of 900 mils of fluid.  Fluid analysis indicated transudative process. Suspect this is related to decompensated CHF.  She was treated with IV diuretics and  has steadily improved.  Will need follow-up chest x-ray in 4 weeks.    4. COPD exacerbation -treated with intravenous steroids, bronchodilators and antibiotics.  She does not have any further wheezing.  She is been placed on a prednisone taper.  Will discharge on nebulizer treatments.  She will also be discharged with supplemental oxygen. 5. Acute on chronic diastolic congestive heart failure.  With evidence of pleural effusions, suspect that decompensated CHF was playing a role in her shortness of breath.    She was treated with IV Lasix.  Urine output was not accurately recorded.  Clinically she improved with improving shortness of breath and lower extremity edema.  Appears to be approaching euvolemia at this time.  Will increase home dose of Lasix from 40 mg daily to twice daily. 6. CKD stage 3 - stable, following.  Creatinine improved with diuresis 7. Chronic Atrial Fibrillation - continue anticoagulation and rate control. Stable.      Discharge Instructions  Discharge Instructions    Diet - low sodium heart healthy   Complete by:  As directed    Increase activity slowly   Complete by:  As directed      Allergies as of 11/05/2017      Reactions   Codeine Anaphylaxis   Tramadol Anaphylaxis   No hydrocodone or anything connected No hydrocodone or anything connected   Clarithromycin Nausea And Vomiting   Citalopram Other (See Comments)   Caused more depression    Fish Allergy Itching   ears   Other    Patient says that all narcotics give her anaphylaxis.    Wellbutrin [bupropion] Other (See  Comments)   "could not speak a full sentence for a whole year"      Medication List    TAKE these medications   albuterol 108 (90 Base) MCG/ACT inhaler Commonly known as:  PROVENTIL HFA;VENTOLIN HFA Inhale 2 puffs into the lungs every 4 (four) hours as needed. What changed:  Another medication with the same name was added. Make sure you understand how and when to take each.   albuterol  (2.5 MG/3ML) 0.083% nebulizer solution Commonly known as:  PROVENTIL Take 3 mLs (2.5 mg total) by nebulization every 6 (six) hours as needed for wheezing. What changed:  You were already taking a medication with the same name, and this prescription was added. Make sure you understand how and when to take each.   carvedilol 12.5 MG tablet Commonly known as:  COREG Take 1 tablet (12.5 mg total) by mouth 2 (two) times daily with a meal.   furosemide 40 MG tablet Commonly known as:  LASIX Take 1 tablet (40 mg total) by mouth 2 (two) times daily. What changed:  when to take this   guaiFENesin 600 MG 12 hr tablet Commonly known as:  MUCINEX Take 1 tablet (600 mg total) by mouth 2 (two) times daily.   hydrALAZINE 25 MG tablet Commonly known as:  APRESOLINE Take 1 tablet (25 mg total) by mouth 3 (three) times daily.   LORazepam 0.5 MG tablet Commonly known as:  ATIVAN Take 0.5 mg by mouth every 8 (eight) hours.   nitroGLYCERIN 0.4 MG SL tablet Commonly known as:  NITROSTAT Place 0.4 mg under the tongue every 5 (five) minutes as needed for chest pain.   pantoprazole 40 MG tablet Commonly known as:  PROTONIX Take 1 tablet (40 mg total) by mouth daily at 12 noon.   potassium chloride SA 20 MEQ tablet Commonly known as:  K-DUR,KLOR-CON Take 2 tablets (40 mEq total) by mouth daily. What changed:  how much to take   predniSONE 10 MG tablet Commonly known as:  DELTASONE Take 40mg  po daily for 2 days then 30mg  daily for 2 days then 20mg  daily for 2 days then 10mg  daily for 2 days then stop   TRELEGY ELLIPTA 100-62.5-25 MCG/INH Aepb Generic drug:  Fluticasone-Umeclidin-Vilant Inhale 1 puff into the lungs daily.   warfarin 5 MG tablet Commonly known as:  COUMADIN Take 1 tablet (5 mg total) by mouth daily. What changed:  how much to take            Durable Medical Equipment  (From admission, onward)        Start     Ordered   11/05/17 1555  For home use only DME Nebulizer  machine  Once    Question:  Patient needs a nebulizer to treat with the following condition  Answer:  COPD (chronic obstructive pulmonary disease) ([place])   11/05/17 1555   11/02/17 1057  For home use only DME oxygen  Once    Comments:  Portable concentrator  Question Answer Comment  Mode or (Route) Nasal cannula   Liters per Minute 2   Frequency Continuous (stationary and portable oxygen unit needed)   Oxygen conserving device Yes   Oxygen delivery system Gas      11/02/17 1057     Follow-up Information    [facility].   Specialty:  Home Health Services Contact information: [address] [place] [address] [phone]        [name], MD. Schedule  an appointment as soon as possible for a visit in 2 week(s).   Specialty:  Family Medicine Contact information: [place] [address] [phone]          Allergies  Allergen Reactions  . Codeine Anaphylaxis  . Tramadol Anaphylaxis    No hydrocodone or anything connected No hydrocodone or anything connected   . Clarithromycin Nausea And Vomiting  . Citalopram Other (See Comments)    Caused more depression   . Fish Allergy Itching    ears  . Other     Patient says that all narcotics give her anaphylaxis.   . Wellbutrin [Bupropion] Other (See Comments)    "could not speak a full sentence for a whole year"    Consultations:     Procedures/Studies: Dg Chest 1 View  Result Date: 11/01/2017 CLINICAL DATA:  RIGHT pleural effusion post thoracentesis EXAM: CHEST  1 VIEW COMPARISON:  Earlier study of 11/01/2017 FINDINGS: Expiratory technique. Decreased RIGHT pleural effusion post thoracentesis. No pneumothorax. Bibasilar atelectasis. Mild enlargement of cardiac silhouette. Atherosclerotic calcification aorta. Upper lungs clear. BILATERAL breast prostheses with capsular calcification at LEFT prosthesis. Bones demineralized. IMPRESSION: No pneumothorax  following RIGHT thoracentesis. Electronically Signed   By: Lavonia Dana M.D.   On: 11/01/2017 17:11   Dg Chest 2 View  Result Date: 10/31/2017 CLINICAL DATA:  Worsening cough x2 days with dyspnea. EXAM: CHEST - 2 VIEW COMPARISON:  10/10/2017 FINDINGS: Stable heart size and mediastinal contours. Mild-to-moderate aortic atherosclerosis at the arch without aneurysm. Hazy opacities at each lung base consistent with layering pleural effusions with adjacent presumed atelectasis are stable. Superimposed pneumonia would be difficult to entirely exclude due to the confluent opacities noted at each lung base. Slight decrease in pulmonary vascular redistribution is identified on current exam. No suspicious osseous abnormalities. IMPRESSION: Slight interval decrease in pulmonary vascular redistribution. Stable bilateral layering pleural effusions with presumed atelectasis and/or infiltrates. Electronically Signed   By: Ashley Royalty M.D.   On: 10/31/2017 19:06   Dg Chest 2 View  Result Date: 10/09/2017 CLINICAL DATA:  Short of breath EXAM: CHEST - 2 VIEW COMPARISON:  09/16/2017 FINDINGS: Extensive bibasilar airspace disease with mild interval improvement. Bilateral pleural effusions also have improved. Underlying chronic lung disease. IMPRESSION: Interval improvement in bibasilar infiltrate and effusion. Electronically Signed   By: Franchot Gallo M.D.   On: 10/09/2017 13:09   Dg Chest Port 1 View  Result Date: 11/01/2017 CLINICAL DATA:  Sepsis. EXAM: PORTABLE CHEST 1 VIEW COMPARISON:  Radiographs of Oct 31, 2017. FINDINGS: Stable cardiomediastinal silhouette. Stable moderate bibasilar edema or atelectasis is noted with moderate pleural effusions. No pneumothorax is noted. Bony thorax is unremarkable. IMPRESSION: Stable bilateral pleural effusions with associated atelectasis or edema. Electronically Signed   By: Marijo Conception, M.D.   On: 11/01/2017 11:42   Dg Chest Port 1 View  Result Date: 10/10/2017 CLINICAL DATA:   Acute shortness of breath. EXAM: PORTABLE CHEST 1 VIEW COMPARISON:  10/09/2017.  09/16/2017. FINDINGS: Allowing for different technical factors, the appearance is similar with persistent bilateral effusions and lower lobe atelectasis and or pneumonia. Upper lungs remain largely clear. Probable interstitial edema. IMPRESSION: Similar appearance allowing for different technical factors. Bilateral effusions with lower lobe atelectasis/infiltrate and interstitial edema. Electronically Signed   By: Nelson Chimes M.D.   On: 10/10/2017 09:00   US Thoracentesis Asp Pleural Space W/img Guide  Result Date: 11/01/2017 INDICATION: RIGHT pleural effusion, shortness of breath, history atrial fibrillation, CHF, COPD, stroke, former smoker  EXAM: ULTRASOUND GUIDED DIAGNOSTIC AND THERAPEUTIC RIGHT THORACENTESIS MEDICATIONS: None. COMPLICATIONS: None immediate. PROCEDURE: Procedure, benefits, and risks of procedure were discussed with patient. Written informed consent for procedure was obtained. Time out protocol followed. Pleural effusion localized by ultrasound at the posterior RIGHT hemithorax. Skin prepped and draped in usual sterile fashion. Skin and soft tissues anesthetized with 10 mL of 1% lidocaine. 8 French thoracentesis catheter placed into the RIGHT pleural space. 900 mL of clear yellow fluid aspirated by syringe pump. Procedure tolerated well by patient without immediate complication. FINDINGS: A total of approximately 900 mL of RIGHT pleural fluid was removed. Samples were sent to the laboratory as requested by the clinical team. IMPRESSION: Successful ultrasound guided RIGHT thoracentesis yielding 900 mL of pleural fluid. Electronically Signed   By: Lavonia Dana M.D.   On: 11/01/2017 17:19      Subjective: Feeling better.  Breathing is improving.  Has productive cough.  Discharge Exam: Vitals:   11/05/17 1446 11/05/17 1452  BP: (!) 170/116   Pulse: 82   Resp: (!) 22   Temp:    SpO2: 96% 96%   Vitals:    11/05/17 0804 11/05/17 0809 11/05/17 1446 11/05/17 1452  BP:   (!) 170/116   Pulse:   82   Resp:   (!) 22   Temp:      TempSrc:      SpO2: 97% 97% 96% 96%  Weight:      Height:        General: Pt is alert, awake, not in acute distress Cardiovascular: RRR, S1/S2 +, no rubs, no gallops Respiratory: Coarse breath sounds at bases Abdominal: Soft, NT, ND, bowel sounds + Extremities: no edema, no cyanosis    The results of significant diagnostics from this hospitalization (including imaging, microbiology, ancillary and laboratory) are listed below for reference.     Microbiology: Recent Results (from the past 240 hour(s))  Gram stain     Status: None   Collection Time: 11/01/17  4:47 PM  Result Value Ref Range Status   Specimen Description PLEURAL  Final   Special Requests NONE  Final   Gram Stain   Final    CYTOSPIN SMEAR NO ORGANISMS SEEN WBC PRESENT, PREDOMINANTLY MONONUCLEAR Performed at [facility], [address], [place], [place] [address]    Report Status 11/01/2017 FINAL  Final  Culture, body fluid-bottle     Status: None (Preliminary result)   Collection Time: 11/01/17  4:47 PM  Result Value Ref Range Status   Specimen Description PLEURAL  Final   Special Requests   Final    BOTTLES DRAWN AEROBIC AND ANAEROBIC Blood Culture adequate volume   Culture   Final    NO GROWTH 4 DAYS Performed at [facility], [address], [place], [place] [address]    Report Status PENDING  Incomplete     Labs: BNP (last 3 results) Recent Labs    09/16/17 0445 09/17/17 0237 10/09/17 1232  BNP >4,500.0* 3,035.3* >9,937.1*   Basic Metabolic Panel: Recent Labs  Lab 10/31/17 1905 11/01/17 0532 11/04/17 0617 11/05/17 0505  NA 135 133* 136 134*  K 3.3* 3.6 2.7* 4.1  CL 98* 98* 98* 100*  CO2 23 23 28 25   GLUCOSE 105* 156* 166* 215*  BUN 12 16 26* 31*  CREATININE 1.38* 1.29* 1.17* 1.19*  CALCIUM 9.0 8.8* 8.7* 8.6*  MG  --   --  2.0  --    Liver Function Tests: No  results for input(s): AST,  ALT, ALKPHOS, BILITOT, PROT, ALBUMIN in the last 168 hours. No results for input(s): LIPASE, AMYLASE in the last 168 hours. No results for input(s): AMMONIA in the last 168 hours. CBC: Recent Labs  Lab 10/31/17 1905 11/01/17 0532  WBC 7.0 3.6*  NEUTROABS 5.0  --   HGB 10.0* 9.5*  HCT 35.6* 34.1*  MCV 75.9* 76.3*  PLT 344 264   Cardiac Enzymes: No results for input(s): CKTOTAL, CKMB, CKMBINDEX, TROPONINI in the last 168 hours. BNP: Invalid input(s): POCBNP CBG: No results for input(s): GLUCAP in the last 168 hours. D-Dimer No results for input(s): DDIMER in the last 72 hours. Hgb A1c No results for input(s): HGBA1C in the last 72 hours. Lipid Profile No results for input(s): CHOL, HDL, LDLCALC, TRIG, CHOLHDL, LDLDIRECT in the last 72 hours. Thyroid function studies No results for input(s): TSH, T4TOTAL, T3FREE, THYROIDAB in the last 72 hours.  Invalid input(s): FREET3 Anemia work up No results for input(s): VITAMINB12, FOLATE, FERRITIN, TIBC, IRON, RETICCTPCT in the last 72 hours. Urinalysis    Component Value Date/Time   COLORURINE STRAW (A) 10/09/2017 1325   APPEARANCEUR CLEAR 10/09/2017 1325   LABSPEC 1.004 (L) 10/09/2017 1325   PHURINE 8.0 10/09/2017 1325   GLUCOSEU NEGATIVE 10/09/2017 1325   HGBUR NEGATIVE 10/09/2017 1325   BILIRUBINUR NEGATIVE 10/09/2017 1325   KETONESUR NEGATIVE 10/09/2017 1325   PROTEINUR NEGATIVE 10/09/2017 1325   NITRITE NEGATIVE 10/09/2017 1325   LEUKOCYTESUR NEGATIVE 10/09/2017 1325   Sepsis Labs Invalid input(s): PROCALCITONIN,  WBC,  LACTICIDVEN Microbiology Recent Results (from the past 240 hour(s))  Gram stain     Status: None   Collection Time: 11/01/17  4:47 PM  Result Value Ref Range Status   Specimen Description PLEURAL  Final   Special Requests NONE  Final   Gram Stain   Final    CYTOSPIN SMEAR NO ORGANISMS SEEN WBC PRESENT, PREDOMINANTLY MONONUCLEAR Performed at [facility], [address], [place], [place] [address]    Report Status 11/01/2017 FINAL  Final  Culture, body fluid-bottle     Status: None (Preliminary result)   Collection Time: 11/01/17  4:47 PM  Result Value Ref Range Status   Specimen Description PLEURAL  Final   Special Requests   Final    BOTTLES DRAWN AEROBIC AND ANAEROBIC Blood Culture adequate volume   Culture   Final    NO GROWTH 4 DAYS Performed at [facility], [address], [place], [place] [address]    Report Status PENDING  Incomplete     Time coordinating discharge: 40 minutes  SIGNED:   Kathie Dike, MD  [facility] 11/05/2017, 7:32 PM Pager   If 7PM-7AM, please contact night-coverage [email or web address] Password [email or web address]

## 2017-11-05 NOTE — Progress Notes (Signed)
ANTICOAGULATION CONSULT NOTE - follow up [place] for Warfarin Indication: atrial fibrillation  Allergies  Allergen Reactions  . Codeine Anaphylaxis  . Tramadol Anaphylaxis    No hydrocodone or anything connected No hydrocodone or anything connected   . Clarithromycin Nausea And Vomiting  . Citalopram Other (See Comments)    Caused more depression   . Fish Allergy Itching    ears  . Other     Patient says that all narcotics give her anaphylaxis.   . Wellbutrin [Bupropion] Other (See Comments)    "could not speak a full sentence for a whole year"    Patient Measurements: Height: 5\' 7"  (170.2 cm) Weight: 130 lb 8.2 oz (59.2 kg) IBW/kg (Calculated) : 61.6  Vital Signs: Temp: 98.9 F (37.2 C) (05/13 0547) Temp Source: Axillary (05/13 0547) BP: 168/86 (05/13 0549) Pulse Rate: 81 (05/13 0547)  Labs: Recent Labs    11/03/17 0447 11/04/17 0617 11/05/17 0505  LABPROT 21.9* 20.3* 21.5*  INR 1.93 1.75 1.89  CREATININE  --  1.17* 1.19*    Estimated Creatinine Clearance: 45.2 mL/min (A) (by C-G formula based on SCr of 1.19 mg/dL (H)).  Assessment: Patient has chronic a fib and on chronic coumadin therapy. INR slightly below goal, no bleeding noted.  Home dose is 2.5mg  daily.  Dose not given 5/10.  Goal of Therapy:  INR 2-3   Plan:  Warfarin 4mg  PO x 1 today. Daily PT/INR Monitor for signs and symptoms of bleeding.   Isac Sarna, BS Pharm D, [place] Clinical Pharmacist Pager [phone] 11/05/2017 1:02 PM

## 2017-11-05 NOTE — Progress Notes (Addendum)
SATURATION QUALIFICATIONS: (This note is used to comply with regulatory documentation for home oxygen)  Patient Saturations on Room Air at Rest = 95%  Patient Saturations on Room Air while Ambulating = 87%  Patient Saturations on 2 L oxygen while Ambulating = 98%

## 2017-11-05 NOTE — Progress Notes (Signed)
Called and spoke with [facility] regarding patient's oxygen. They stated that they are still coming out this evening to deliver the oxygen to the patient. I attempted to call the patient's sister, [name] to inform her of this, however she didn't answer. A voicemail was left, no return call. I will inform the oncoming nurse of this.

## 2017-11-05 NOTE — Progress Notes (Signed)
PT BP elevated with dinamap. Checked manually and has decreased. Continue to monitor. Medication to be given at 0800.

## 2017-11-06 ENCOUNTER — Inpatient Hospital Stay (HOSPITAL_COMMUNITY): Admission: RE | Admit: 2017-11-06 | Payer: Managed Care, Other (non HMO) | Source: Ambulatory Visit

## 2017-11-06 LAB — CULTURE, BODY FLUID W GRAM STAIN -BOTTLE: Special Requests: ADEQUATE

## 2017-11-06 LAB — PROTIME-INR
INR: 2.17
PROTHROMBIN TIME: 24 s — AB (ref 11.4–15.2)

## 2017-11-06 LAB — CULTURE, BODY FLUID-BOTTLE: CULTURE: NO GROWTH

## 2017-11-06 NOTE — Progress Notes (Signed)
Portable O2 was delivered. Pt called sister to pick her up and take her home. [name] (sister) told pt she could not come now and adv that she would come in the am at 0800. Pt became very upset and started yelling at sister and at staff. She said she was going home even if she had to walk. I talked to patient and [name]. Adv ok to stay the night if no ride was available. Pt began to cry and told sister that was not fair and hung up on sister. Pt then call her cousin Lissa Hoard) cried and yelled at him about her situation. He tried to calm her down and told her the best option was for her to stay. Pt very upset. She is staying the night "only because I don't have a ride and you people will not let me go home" Pt is sitting up in bed. Will continue to monitor patient.

## 2017-11-06 NOTE — Care Management (Signed)
CM called pt to verify she received neb machine. No answer. Left VM for her to return my call if she did NOT receive her neb machine.

## 2017-11-06 NOTE — Progress Notes (Signed)
IV removed and discharge instructions reviewed with patient and sister. Scripts sent to pharmacy.

## 2017-11-07 ENCOUNTER — Ambulatory Visit: Payer: Self-pay | Admitting: Adult Health

## 2017-11-07 NOTE — Care Management (Signed)
CM received call from patient indicating she had not yet received neb machine. CM contacted Huey Romans who has received order and authorization from [place]. Huey Romans says they have attempted to contact pt multiple times to verify order and address. [employer] request CM provide pt with number for the patient service center. CM requested they cont to reach out to patient via phone themselves also. Huey Romans says they will make a total of 4 attempts (did not say how many attempts they have already made). CM called patient to provide number to service center and pt did not want to take number stating "I am the patient. They should call me. I will not call them." Someone else with the patient asked to take the number. Number provided to unspecified family/friend.

## 2017-11-30 ENCOUNTER — Other Ambulatory Visit: Payer: Self-pay

## 2017-11-30 ENCOUNTER — Inpatient Hospital Stay (HOSPITAL_COMMUNITY)
Admission: EM | Admit: 2017-11-30 | Discharge: 2017-12-05 | DRG: 291 | Disposition: A | Discharge status: Home Health | Payer: BLUE CROSS/BLUE SHIELD | Attending: Internal Medicine | Admitting: Internal Medicine

## 2017-11-30 DIAGNOSIS — I5033 Acute on chronic diastolic (congestive) heart failure: Secondary | ICD-10-CM | POA: Diagnosis not present

## 2017-11-30 DIAGNOSIS — I509 Heart failure, unspecified: Secondary | ICD-10-CM

## 2017-11-30 DIAGNOSIS — F419 Anxiety disorder, unspecified: Secondary | ICD-10-CM | POA: Diagnosis present

## 2017-11-30 DIAGNOSIS — E872 Acidosis, unspecified: Secondary | ICD-10-CM | POA: Diagnosis present

## 2017-11-30 DIAGNOSIS — I701 Atherosclerosis of renal artery: Secondary | ICD-10-CM | POA: Diagnosis present

## 2017-11-30 DIAGNOSIS — R778 Other specified abnormalities of plasma proteins: Secondary | ICD-10-CM | POA: Diagnosis present

## 2017-11-30 DIAGNOSIS — Z9981 Dependence on supplemental oxygen: Secondary | ICD-10-CM

## 2017-11-30 DIAGNOSIS — Z7952 Long term (current) use of systemic steroids: Secondary | ICD-10-CM

## 2017-11-30 DIAGNOSIS — I159 Secondary hypertension, unspecified: Secondary | ICD-10-CM | POA: Diagnosis present

## 2017-11-30 DIAGNOSIS — Z87892 Personal history of anaphylaxis: Secondary | ICD-10-CM

## 2017-11-30 DIAGNOSIS — Z885 Allergy status to narcotic agent status: Secondary | ICD-10-CM

## 2017-11-30 DIAGNOSIS — N183 Chronic kidney disease, stage 3 unspecified: Secondary | ICD-10-CM | POA: Diagnosis present

## 2017-11-30 DIAGNOSIS — Z888 Allergy status to other drugs, medicaments and biological substances status: Secondary | ICD-10-CM

## 2017-11-30 DIAGNOSIS — F17210 Nicotine dependence, cigarettes, uncomplicated: Secondary | ICD-10-CM | POA: Diagnosis present

## 2017-11-30 DIAGNOSIS — Z86711 Personal history of pulmonary embolism: Secondary | ICD-10-CM

## 2017-11-30 DIAGNOSIS — J441 Chronic obstructive pulmonary disease with (acute) exacerbation: Secondary | ICD-10-CM | POA: Diagnosis present

## 2017-11-30 DIAGNOSIS — Z91013 Allergy to seafood: Secondary | ICD-10-CM

## 2017-11-30 DIAGNOSIS — I4891 Unspecified atrial fibrillation: Secondary | ICD-10-CM | POA: Diagnosis present

## 2017-11-30 DIAGNOSIS — Z9882 Breast implant status: Secondary | ICD-10-CM

## 2017-11-30 DIAGNOSIS — I513 Intracardiac thrombosis, not elsewhere classified: Secondary | ICD-10-CM

## 2017-11-30 DIAGNOSIS — J9601 Acute respiratory failure with hypoxia: Secondary | ICD-10-CM | POA: Diagnosis present

## 2017-11-30 DIAGNOSIS — Z8619 Personal history of other infectious and parasitic diseases: Secondary | ICD-10-CM

## 2017-11-30 DIAGNOSIS — Z881 Allergy status to other antibiotic agents status: Secondary | ICD-10-CM

## 2017-11-30 DIAGNOSIS — Z7901 Long term (current) use of anticoagulants: Secondary | ICD-10-CM

## 2017-11-30 DIAGNOSIS — R7989 Other specified abnormal findings of blood chemistry: Secondary | ICD-10-CM | POA: Diagnosis present

## 2017-11-30 DIAGNOSIS — G9341 Metabolic encephalopathy: Secondary | ICD-10-CM

## 2017-11-30 DIAGNOSIS — J9621 Acute and chronic respiratory failure with hypoxia: Secondary | ICD-10-CM

## 2017-11-30 DIAGNOSIS — I248 Other forms of acute ischemic heart disease: Secondary | ICD-10-CM | POA: Diagnosis present

## 2017-11-30 DIAGNOSIS — J189 Pneumonia, unspecified organism: Secondary | ICD-10-CM | POA: Diagnosis present

## 2017-11-30 DIAGNOSIS — A419 Sepsis, unspecified organism: Secondary | ICD-10-CM

## 2017-11-30 DIAGNOSIS — F05 Delirium due to known physiological condition: Secondary | ICD-10-CM | POA: Diagnosis not present

## 2017-11-30 DIAGNOSIS — I240 Acute coronary thrombosis not resulting in myocardial infarction: Secondary | ICD-10-CM

## 2017-11-30 DIAGNOSIS — I5189 Other ill-defined heart diseases: Secondary | ICD-10-CM

## 2017-11-30 DIAGNOSIS — Z7951 Long term (current) use of inhaled steroids: Secondary | ICD-10-CM

## 2017-11-30 DIAGNOSIS — I69318 Other symptoms and signs involving cognitive functions following cerebral infarction: Secondary | ICD-10-CM

## 2017-11-30 LAB — I-STAT CG4 LACTIC ACID, ED: Lactic Acid, Venous: 6.59 mmol/L (ref 0.5–1.9)

## 2017-11-30 NOTE — ED Triage Notes (Signed)
Complaining os shortness of breath and anxiety

## 2017-12-01 ENCOUNTER — Inpatient Hospital Stay (HOSPITAL_COMMUNITY): Payer: BLUE CROSS/BLUE SHIELD

## 2017-12-01 ENCOUNTER — Encounter (HOSPITAL_COMMUNITY): Payer: Self-pay | Admitting: Internal Medicine

## 2017-12-01 ENCOUNTER — Other Ambulatory Visit (HOSPITAL_COMMUNITY): Payer: Managed Care, Other (non HMO)

## 2017-12-01 ENCOUNTER — Emergency Department (HOSPITAL_COMMUNITY): Payer: BLUE CROSS/BLUE SHIELD

## 2017-12-01 DIAGNOSIS — Z888 Allergy status to other drugs, medicaments and biological substances status: Secondary | ICD-10-CM | POA: Diagnosis not present

## 2017-12-01 DIAGNOSIS — J9621 Acute and chronic respiratory failure with hypoxia: Secondary | ICD-10-CM

## 2017-12-01 DIAGNOSIS — F17210 Nicotine dependence, cigarettes, uncomplicated: Secondary | ICD-10-CM | POA: Diagnosis present

## 2017-12-01 DIAGNOSIS — J9601 Acute respiratory failure with hypoxia: Secondary | ICD-10-CM

## 2017-12-01 DIAGNOSIS — I240 Acute coronary thrombosis not resulting in myocardial infarction: Secondary | ICD-10-CM

## 2017-12-01 DIAGNOSIS — Z9981 Dependence on supplemental oxygen: Secondary | ICD-10-CM | POA: Diagnosis not present

## 2017-12-01 DIAGNOSIS — N183 Chronic kidney disease, stage 3 (moderate): Secondary | ICD-10-CM | POA: Diagnosis present

## 2017-12-01 DIAGNOSIS — R748 Abnormal levels of other serum enzymes: Secondary | ICD-10-CM | POA: Diagnosis not present

## 2017-12-01 DIAGNOSIS — E872 Acidosis, unspecified: Secondary | ICD-10-CM | POA: Diagnosis present

## 2017-12-01 DIAGNOSIS — I248 Other forms of acute ischemic heart disease: Secondary | ICD-10-CM | POA: Diagnosis present

## 2017-12-01 DIAGNOSIS — J441 Chronic obstructive pulmonary disease with (acute) exacerbation: Secondary | ICD-10-CM | POA: Diagnosis present

## 2017-12-01 DIAGNOSIS — Z9882 Breast implant status: Secondary | ICD-10-CM | POA: Diagnosis not present

## 2017-12-01 DIAGNOSIS — I5033 Acute on chronic diastolic (congestive) heart failure: Secondary | ICD-10-CM | POA: Diagnosis present

## 2017-12-01 DIAGNOSIS — I513 Intracardiac thrombosis, not elsewhere classified: Secondary | ICD-10-CM

## 2017-12-01 DIAGNOSIS — J189 Pneumonia, unspecified organism: Secondary | ICD-10-CM | POA: Diagnosis present

## 2017-12-01 DIAGNOSIS — I69318 Other symptoms and signs involving cognitive functions following cerebral infarction: Secondary | ICD-10-CM | POA: Diagnosis not present

## 2017-12-01 DIAGNOSIS — I4891 Unspecified atrial fibrillation: Secondary | ICD-10-CM

## 2017-12-01 DIAGNOSIS — F419 Anxiety disorder, unspecified: Secondary | ICD-10-CM | POA: Diagnosis present

## 2017-12-01 DIAGNOSIS — F05 Delirium due to known physiological condition: Secondary | ICD-10-CM | POA: Diagnosis not present

## 2017-12-01 DIAGNOSIS — Z87892 Personal history of anaphylaxis: Secondary | ICD-10-CM | POA: Diagnosis not present

## 2017-12-01 DIAGNOSIS — G9341 Metabolic encephalopathy: Secondary | ICD-10-CM | POA: Diagnosis present

## 2017-12-01 DIAGNOSIS — I159 Secondary hypertension, unspecified: Secondary | ICD-10-CM | POA: Diagnosis present

## 2017-12-01 DIAGNOSIS — I509 Heart failure, unspecified: Secondary | ICD-10-CM | POA: Diagnosis not present

## 2017-12-01 DIAGNOSIS — A419 Sepsis, unspecified organism: Secondary | ICD-10-CM | POA: Diagnosis not present

## 2017-12-01 DIAGNOSIS — I701 Atherosclerosis of renal artery: Secondary | ICD-10-CM | POA: Diagnosis present

## 2017-12-01 LAB — RESPIRATORY PANEL BY PCR
ADENOVIRUS-RVPPCR: NOT DETECTED
Bordetella pertussis: NOT DETECTED
CORONAVIRUS NL63-RVPPCR: NOT DETECTED
CORONAVIRUS OC43-RVPPCR: NOT DETECTED
Chlamydophila pneumoniae: NOT DETECTED
Coronavirus 229E: NOT DETECTED
Coronavirus HKU1: NOT DETECTED
INFLUENZA A-RVPPCR: NOT DETECTED
INFLUENZA B-RVPPCR: NOT DETECTED
Metapneumovirus: NOT DETECTED
Mycoplasma pneumoniae: NOT DETECTED
PARAINFLUENZA VIRUS 1-RVPPCR: NOT DETECTED
PARAINFLUENZA VIRUS 4-RVPPCR: NOT DETECTED
Parainfluenza Virus 2: NOT DETECTED
Parainfluenza Virus 3: NOT DETECTED
RESPIRATORY SYNCYTIAL VIRUS-RVPPCR: NOT DETECTED
Rhinovirus / Enterovirus: NOT DETECTED

## 2017-12-01 LAB — COMPREHENSIVE METABOLIC PANEL
ALBUMIN: 3.4 g/dL — AB (ref 3.5–5.0)
ALT: 14 U/L (ref 14–54)
ANION GAP: 15 (ref 5–15)
AST: 17 U/L (ref 15–41)
Alkaline Phosphatase: 81 U/L (ref 38–126)
BILIRUBIN TOTAL: 1.8 mg/dL — AB (ref 0.3–1.2)
BUN: 13 mg/dL (ref 6–20)
CHLORIDE: 104 mmol/L (ref 101–111)
CO2: 19 mmol/L — ABNORMAL LOW (ref 22–32)
Calcium: 9.4 mg/dL (ref 8.9–10.3)
Creatinine, Ser: 1.43 mg/dL — ABNORMAL HIGH (ref 0.44–1.00)
GFR calc Af Amer: 44 mL/min — ABNORMAL LOW (ref >60)
GFR calc non Af Amer: 38 mL/min — ABNORMAL LOW (ref >60)
GLUCOSE: 187 mg/dL — AB (ref 65–99)
POTASSIUM: 4 mmol/L (ref 3.5–5.1)
Sodium: 138 mmol/L (ref 135–145)
TOTAL PROTEIN: 7.6 g/dL (ref 6.5–8.1)

## 2017-12-01 LAB — URINALYSIS, ROUTINE W REFLEX MICROSCOPIC
Bilirubin Urine: NEGATIVE
Glucose, UA: NEGATIVE mg/dL
Hgb urine dipstick: NEGATIVE
KETONES UR: NEGATIVE mg/dL
Leukocytes, UA: NEGATIVE
Nitrite: NEGATIVE
PROTEIN: 100 mg/dL — AB
Specific Gravity, Urine: 1.016 (ref 1.005–1.030)
pH: 5 (ref 5.0–8.0)

## 2017-12-01 LAB — RAPID URINE DRUG SCREEN, HOSP PERFORMED
Amphetamines: NOT DETECTED
Barbiturates: NOT DETECTED
Benzodiazepines: NOT DETECTED
Cocaine: NOT DETECTED
OPIATES: NOT DETECTED
Tetrahydrocannabinol: NOT DETECTED

## 2017-12-01 LAB — BASIC METABOLIC PANEL
ANION GAP: 11 (ref 5–15)
BUN: 14 mg/dL (ref 6–20)
CO2: 20 mmol/L — AB (ref 22–32)
Calcium: 8.5 mg/dL — ABNORMAL LOW (ref 8.9–10.3)
Chloride: 104 mmol/L (ref 101–111)
Creatinine, Ser: 1.29 mg/dL — ABNORMAL HIGH (ref 0.44–1.00)
GFR calc Af Amer: 50 mL/min — ABNORMAL LOW (ref >60)
GFR calc non Af Amer: 43 mL/min — ABNORMAL LOW (ref >60)
GLUCOSE: 148 mg/dL — AB (ref 65–99)
POTASSIUM: 3.6 mmol/L (ref 3.5–5.1)
Sodium: 135 mmol/L (ref 135–145)

## 2017-12-01 LAB — URINALYSIS, COMPLETE (UACMP) WITH MICROSCOPIC
BILIRUBIN URINE: NEGATIVE
GLUCOSE, UA: NEGATIVE mg/dL
HGB URINE DIPSTICK: NEGATIVE
Ketones, ur: NEGATIVE mg/dL
LEUKOCYTES UA: NEGATIVE
NITRITE: NEGATIVE
PROTEIN: NEGATIVE mg/dL
Specific Gravity, Urine: 1.004 — ABNORMAL LOW (ref 1.005–1.030)
pH: 5 (ref 5.0–8.0)

## 2017-12-01 LAB — BRAIN NATRIURETIC PEPTIDE: B Natriuretic Peptide: >4500 pg/mL — ABNORMAL HIGH (ref 0.0–100.0)

## 2017-12-01 LAB — PROCALCITONIN: Procalcitonin: 0.29 ng/mL

## 2017-12-01 LAB — CBC WITH DIFFERENTIAL/PLATELET
BASOS ABS: 0 10*3/uL (ref 0.0–0.1)
Basophils Relative: 0 %
Eosinophils Absolute: 0.1 10*3/uL (ref 0.0–0.7)
Eosinophils Relative: 1 %
HCT: 39.9 % (ref 36.0–46.0)
Hemoglobin: 11 g/dL — ABNORMAL LOW (ref 12.0–15.0)
LYMPHS ABS: 2.3 10*3/uL (ref 0.7–4.0)
LYMPHS PCT: 21 %
MCH: 21.7 pg — ABNORMAL LOW (ref 26.0–34.0)
MCHC: 27.6 g/dL — ABNORMAL LOW (ref 30.0–36.0)
MCV: 78.5 fL (ref 78.0–100.0)
MONO ABS: 1.2 10*3/uL — AB (ref 0.1–1.0)
Monocytes Relative: 11 %
Neutro Abs: 7.4 10*3/uL (ref 1.7–7.7)
Neutrophils Relative %: 67 %
Platelets: 518 10*3/uL — ABNORMAL HIGH (ref 150–400)
RBC: 5.08 MIL/uL (ref 3.87–5.11)
RDW: 20.9 % — ABNORMAL HIGH (ref 11.5–15.5)
WBC: 11.1 10*3/uL — AB (ref 4.0–10.5)

## 2017-12-01 LAB — TSH: TSH: 1.736 u[IU]/mL (ref 0.350–4.500)

## 2017-12-01 LAB — BLOOD GAS, ARTERIAL
Acid-base deficit: 11.9 mmol/L — ABNORMAL HIGH (ref 0.0–2.0)
DELIVERY SYSTEMS: POSITIVE
DRAWN BY: 22223
EXPIRATORY PAP: 6
FIO2: 100
Inspiratory PAP: 14
LHR: 10 {breaths}/min
PCO2 ART: 28.7 mmHg — AB (ref 32.0–48.0)
PH ART: 7.291 — AB (ref 7.350–7.450)
pO2, Arterial: 345 mmHg — ABNORMAL HIGH (ref 83.0–108.0)

## 2017-12-01 LAB — ECHOCARDIOGRAM COMPLETE
Height: 64 in
WEIGHTICAEL: 2388.02 [oz_av]

## 2017-12-01 LAB — VITAMIN B12: Vitamin B-12: 362 pg/mL (ref 180–914)

## 2017-12-01 LAB — LACTIC ACID, PLASMA
Lactic Acid, Venous: 4.8 mmol/L (ref 0.5–1.9)
Lactic Acid, Venous: 2.5 mmol/L (ref 0.5–1.9)

## 2017-12-01 LAB — TROPONIN I
TROPONIN I: 0.09 ng/mL — AB (ref <0.03)
TROPONIN I: 0.1 ng/mL — AB (ref <0.03)
TROPONIN I: 0.15 ng/mL — AB (ref <0.03)
Troponin I: 0.11 ng/mL (ref <0.03)

## 2017-12-01 LAB — PROTIME-INR
INR: 3.96
PROTHROMBIN TIME: 38.4 s — AB (ref 11.4–15.2)

## 2017-12-01 LAB — MAGNESIUM
MAGNESIUM: 2.1 mg/dL (ref 1.7–2.4)
Magnesium: 1.7 mg/dL (ref 1.7–2.4)

## 2017-12-01 LAB — PHOSPHORUS: PHOSPHORUS: 4.3 mg/dL (ref 2.5–4.6)

## 2017-12-01 LAB — T4, FREE: Free T4: 1.63 ng/dL (ref 0.82–1.77)

## 2017-12-01 LAB — STREP PNEUMONIAE URINARY ANTIGEN: Strep Pneumo Urinary Antigen: NEGATIVE

## 2017-12-01 LAB — MRSA PCR SCREENING: MRSA BY PCR: POSITIVE — AB

## 2017-12-01 MED ORDER — HYDRALAZINE HCL 25 MG PO TABS: 25.0000 mg | ORAL_TABLET | Freq: Three times a day (TID) | ORAL | Status: DC

## 2017-12-01 MED ORDER — METHYLPREDNISOLONE SODIUM SUCC 40 MG IJ SOLR: 40.0000 mg | Freq: Four times a day (QID) | INTRAMUSCULAR | Status: DC

## 2017-12-01 MED ORDER — FUROSEMIDE 10 MG/ML IJ SOLN
40.0000 mg | Freq: Two times a day (BID) | INTRAMUSCULAR | Status: AC
  Administered 2017-12-01 – 2017-12-04 (×8): 40 mg via INTRAVENOUS
  Filled 2017-12-01 (×8): qty 4

## 2017-12-01 MED ORDER — VANCOMYCIN HCL IN DEXTROSE 1-5 GM/200ML-% IV SOLN
1000.0000 mg | INTRAVENOUS | Status: DC
  Administered 2017-12-01 – 2017-12-02 (×2): 1000 mg via INTRAVENOUS
  Filled 2017-12-01 (×2): qty 200

## 2017-12-01 MED ORDER — CEFEPIME HCL 2 G IJ SOLR
2.0000 g | INTRAMUSCULAR | Status: DC
  Administered 2017-12-01 – 2017-12-02 (×2): 2 g via INTRAVENOUS
  Filled 2017-12-01 (×3): qty 2

## 2017-12-01 MED ORDER — SODIUM CHLORIDE 0.9 % IV BOLUS
1000.0000 mL | Freq: Once | INTRAVENOUS | Status: AC
  Administered 2017-12-01: 1000 mL via INTRAVENOUS

## 2017-12-01 MED ORDER — IPRATROPIUM-ALBUTEROL 0.5-2.5 (3) MG/3ML IN SOLN: 3.0000 mL | Freq: Once | RESPIRATORY_TRACT | Status: DC

## 2017-12-01 MED ORDER — METOPROLOL TARTRATE 5 MG/5ML IV SOLN
2.5000 mg | Freq: Four times a day (QID) | INTRAVENOUS | Status: AC
  Administered 2017-12-01: 2.5 mg via INTRAVENOUS
  Filled 2017-12-01: qty 5

## 2017-12-01 MED ORDER — HALOPERIDOL LACTATE 5 MG/ML IJ SOLN
2.5000 mg | Freq: Four times a day (QID) | INTRAMUSCULAR | Status: DC | PRN
  Administered 2017-12-01 – 2017-12-04 (×5): 2.5 mg via INTRAVENOUS
  Filled 2017-12-01 (×5): qty 1

## 2017-12-01 MED ORDER — METHYLPREDNISOLONE SODIUM SUCC 125 MG IJ SOLR
125.0000 mg | Freq: Once | INTRAMUSCULAR | Status: AC
  Administered 2017-12-01: 125 mg via INTRAVENOUS
  Filled 2017-12-01: qty 2

## 2017-12-01 MED ORDER — ORAL CARE MOUTH RINSE
15.0000 mL | Freq: Two times a day (BID) | OROMUCOSAL | Status: DC
  Administered 2017-12-01 – 2017-12-04 (×5): 15 mL via OROMUCOSAL

## 2017-12-01 MED ORDER — LORAZEPAM 0.5 MG PO TABS
0.5000 mg | ORAL_TABLET | Freq: Three times a day (TID) | ORAL | Status: DC | PRN
  Administered 2017-12-01 – 2017-12-03 (×2): 0.5 mg via ORAL
  Filled 2017-12-01 (×2): qty 1

## 2017-12-01 MED ORDER — LORAZEPAM 2 MG/ML IJ SOLN
1.0000 mg | Freq: Once | INTRAMUSCULAR | Status: AC
  Administered 2017-12-01: 1 mg via INTRAVENOUS
  Filled 2017-12-01: qty 1

## 2017-12-01 MED ORDER — NITROGLYCERIN 2 % TD OINT
0.5000 [in_us] | TOPICAL_OINTMENT | Freq: Once | TRANSDERMAL | Status: AC
  Administered 2017-12-01: 0.5 [in_us] via TOPICAL
  Filled 2017-12-01: qty 1

## 2017-12-01 MED ORDER — SODIUM CHLORIDE 0.9 % IV SOLN
1.0000 g | Freq: Three times a day (TID) | INTRAVENOUS | Status: DC
  Administered 2017-12-01: 1 g via INTRAVENOUS
  Filled 2017-12-01 (×8): qty 1

## 2017-12-01 MED ORDER — LEVALBUTEROL HCL 0.63 MG/3ML IN NEBU
0.6300 mg | INHALATION_SOLUTION | Freq: Four times a day (QID) | RESPIRATORY_TRACT | Status: DC | PRN
  Administered 2017-12-01 – 2017-12-02 (×2): 0.63 mg via RESPIRATORY_TRACT
  Filled 2017-12-01 (×2): qty 3

## 2017-12-01 MED ORDER — IPRATROPIUM-ALBUTEROL 0.5-2.5 (3) MG/3ML IN SOLN: 3.0000 mL | Freq: Four times a day (QID) | RESPIRATORY_TRACT | Status: DC

## 2017-12-01 MED ORDER — MAGNESIUM SULFATE 2 GM/50ML IV SOLN
2.0000 g | Freq: Once | INTRAVENOUS | Status: AC
  Administered 2017-12-01: 2 g via INTRAVENOUS
  Filled 2017-12-01: qty 50

## 2017-12-01 MED ORDER — POTASSIUM CHLORIDE 10 MEQ/100ML IV SOLN
10.0000 meq | INTRAVENOUS | Status: AC
  Administered 2017-12-01 (×3): 10 meq via INTRAVENOUS
  Filled 2017-12-01: qty 100

## 2017-12-01 MED ORDER — CARVEDILOL 12.5 MG PO TABS
12.5000 mg | ORAL_TABLET | Freq: Two times a day (BID) | ORAL | Status: DC
  Administered 2017-12-02 – 2017-12-04 (×6): 12.5 mg via ORAL
  Filled 2017-12-01 (×7): qty 1

## 2017-12-01 MED ORDER — PANTOPRAZOLE SODIUM 40 MG PO TBEC
40.0000 mg | DELAYED_RELEASE_TABLET | Freq: Every day | ORAL | Status: DC
  Administered 2017-12-02 – 2017-12-04 (×3): 40 mg via ORAL
  Filled 2017-12-01 (×3): qty 1

## 2017-12-01 MED ORDER — VANCOMYCIN HCL IN DEXTROSE 1-5 GM/200ML-% IV SOLN
1000.0000 mg | Freq: Once | INTRAVENOUS | Status: AC
  Administered 2017-12-01: 1000 mg via INTRAVENOUS
  Filled 2017-12-01: qty 200

## 2017-12-01 MED ORDER — LEVALBUTEROL HCL 1.25 MG/0.5ML IN NEBU
1.2500 mg | INHALATION_SOLUTION | Freq: Four times a day (QID) | RESPIRATORY_TRACT | Status: DC
  Administered 2017-12-01 – 2017-12-03 (×9): 1.25 mg via RESPIRATORY_TRACT
  Filled 2017-12-01 (×9): qty 0.5

## 2017-12-01 MED ORDER — METHYLPREDNISOLONE SODIUM SUCC 125 MG IJ SOLR
60.0000 mg | Freq: Three times a day (TID) | INTRAMUSCULAR | Status: DC
  Administered 2017-12-01 – 2017-12-03 (×7): 60 mg via INTRAVENOUS
  Filled 2017-12-01 (×7): qty 2

## 2017-12-01 MED ORDER — IPRATROPIUM BROMIDE 0.02 % IN SOLN
0.5000 mg | Freq: Four times a day (QID) | RESPIRATORY_TRACT | Status: DC
  Administered 2017-12-01 – 2017-12-03 (×11): 0.5 mg via RESPIRATORY_TRACT
  Filled 2017-12-01 (×11): qty 2.5

## 2017-12-01 MED ORDER — CHLORHEXIDINE GLUCONATE 0.12 % MT SOLN
15.0000 mL | Freq: Two times a day (BID) | OROMUCOSAL | Status: DC
  Administered 2017-12-01 – 2017-12-04 (×7): 15 mL via OROMUCOSAL
  Filled 2017-12-01 (×7): qty 15

## 2017-12-01 MED ORDER — BUDESONIDE 0.25 MG/2ML IN SUSP
0.2500 mg | Freq: Two times a day (BID) | RESPIRATORY_TRACT | Status: DC
  Administered 2017-12-01 – 2017-12-05 (×8): 0.25 mg via RESPIRATORY_TRACT
  Filled 2017-12-01 (×8): qty 2

## 2017-12-01 MED ORDER — LEVALBUTEROL HCL 1.25 MG/0.5ML IN NEBU
1.2500 mg | INHALATION_SOLUTION | Freq: Four times a day (QID) | RESPIRATORY_TRACT | Status: DC
  Administered 2017-12-01: 1.25 mg via RESPIRATORY_TRACT
  Filled 2017-12-01: qty 0.5

## 2017-12-01 MED ORDER — PERFLUTREN LIPID MICROSPHERE
1.0000 mL | INTRAVENOUS | Status: AC | PRN
  Administered 2017-12-01: 2 mL via INTRAVENOUS
  Filled 2017-12-01: qty 10

## 2017-12-01 MED ORDER — FAMOTIDINE IN NACL 20-0.9 MG/50ML-% IV SOLN
20.0000 mg | Freq: Once | INTRAVENOUS | Status: AC
  Administered 2017-12-01: 20 mg via INTRAVENOUS
  Filled 2017-12-01: qty 50

## 2017-12-01 MED ORDER — METOPROLOL TARTRATE 5 MG/5ML IV SOLN
2.5000 mg | Freq: Four times a day (QID) | INTRAVENOUS | Status: DC
  Administered 2017-12-01 (×3): 2.5 mg via INTRAVENOUS
  Filled 2017-12-01 (×3): qty 5

## 2017-12-01 MED ORDER — ACETAMINOPHEN 325 MG PO TABS
650.0000 mg | ORAL_TABLET | Freq: Four times a day (QID) | ORAL | Status: DC | PRN
  Administered 2017-12-03: 650 mg via ORAL
  Filled 2017-12-01: qty 2

## 2017-12-01 MED ORDER — CARVEDILOL 12.5 MG PO TABS: 12.5000 mg | ORAL_TABLET | Freq: Two times a day (BID) | ORAL | Status: DC

## 2017-12-01 MED ORDER — GUAIFENESIN ER 600 MG PO TB12: 600.0000 mg | ORAL_TABLET | Freq: Two times a day (BID) | ORAL | Status: DC

## 2017-12-01 MED ORDER — POTASSIUM CHLORIDE CRYS ER 20 MEQ PO TBCR: 20.0000 meq | EXTENDED_RELEASE_TABLET | Freq: Every day | ORAL | Status: DC

## 2017-12-01 MED ORDER — ACETAMINOPHEN 650 MG RE SUPP: 650.0000 mg | Freq: Four times a day (QID) | RECTAL | Status: DC | PRN

## 2017-12-01 MED ORDER — PIPERACILLIN-TAZOBACTAM 3.375 G IVPB
3.3750 g | Freq: Once | INTRAVENOUS | Status: AC
  Administered 2017-12-01: 3.375 g via INTRAVENOUS
  Filled 2017-12-01: qty 50

## 2017-12-01 NOTE — Progress Notes (Signed)
Pharmacy Antibiotic Note  Glenda Johnson is a 62 y.o. female admitted on 11/30/2017 with HCAP/ pneumonia.  Pharmacy has been consulted for Vancomycin dosing.  Plan: Vancomycin 1000mg   IV every 24 hours.  Goal trough 15-20 mcg/mL. Adjusted Cefepime 2gm IV q24h F/U cxs and clinical progress Monitor V/S, labs and levels as indicated  Height: 5\' 4"  (162.6 cm) Weight: 149 lb 4 oz (67.7 kg) IBW/kg (Calculated) : 54.7  Temp (24hrs), Avg:99.6 F (37.6 C), Min:98.2 F (36.8 C), Max:101.7 F (38.7 C)  Recent Labs  Lab 11/30/17 2354 12/01/17 0005 12/01/17 0253 12/01/17 0530  WBC  --  11.1*  --   --   CREATININE  --  1.43*  --   --   LATICACIDVEN 6.59*  --  4.8* 2.5*    Estimated Creatinine Clearance: 38.1 mL/min (A) (by C-G formula based on SCr of 1.43 mg/dL (H)).    Allergies  Allergen Reactions  . Codeine Anaphylaxis  . Tramadol Anaphylaxis    No hydrocodone or anything connected No hydrocodone or anything connected   . Clarithromycin Nausea And Vomiting  . Citalopram Other (See Comments)    Caused more depression   . Fish Allergy Itching    ears  . Other     Patient says that all narcotics give her anaphylaxis.   . Wellbutrin [Bupropion] Other (See Comments)    "could not speak a full sentence for a whole year"    Antimicrobials this admission: Vancomycin 6/8 >>  Cefepime 6/8 >>  Zosyn x 1 dose in ED 6/8  Dose adjustments this admission: n/a  Microbiology results: 6/8 BCx: pending 6/8 UCx: pending  Thank you for allowing pharmacy to be a part of this patient's care.  Isac Sarna, BS Pharm D, [place] Clinical Pharmacist Pager [phone] 12/01/2017 7:40 AM

## 2017-12-01 NOTE — Progress Notes (Signed)
PROGRESS NOTE  Glenda Johnson VEL:[id number] DOB: 1955-09-20 DOA: 11/30/2017 PCP: [name], MD  Brief History:  63 year old female with history of COPD, diastolic CHF, atrial fibrillation, hep C, LV thrombus, stroke, continued tobacco abuse, hypertension, and anxiety presenting with shortness of breath and nonproductive cough.  The patient also complained of fever up to 100.9 F at home.  There were no reports of vomiting, diarrhea, abdominal pain.  The patient was recently discharged from the hospital after a stay from 10/31/2017 through 11/05/2017 when she was treated for COPD exacerbation and sepsis secondary to pneumonia.  Patient was discharged home with 2 L nasal cannula.  However, she does not use it because she states it  "knocks out the power in her house".    Unfortunately, the history is limited secondary to the patient's somnolence and extremis.  When the patient was evaluated by Dr. Olevia Bowens, she was somnolent and hypoxic.  The patient was placed on BiPAP and admitted to the stepdown unit.  At the time of admission, the patient was noted to have a fever 1 1.7 F with lactic acid of 6.59.  The patient was started on Solu-Medrol and IV fluids initially.  Initial troponin was 0.10.  W BC was 11.1.  She was started on vancomycin and cefepime.    Assessment/Plan: Acute on chronic respiratory failure with hypoxia -Secondary to decompensated CHF and COPD exacerbation -Presently on BiPAP -Wean back to baseline 2 L -Pulmonary hygiene  Acute on chronic diastolic CHF -Start furosemide 40 mg IV twice daily -Daily weights -Fluid restrict -Convert to IV metoprolol for now as the patient is unable to tolerate po medications presently -09/16/2017 echo EF 55-60%, grade 2 DD, no WMA, mild TR, trivial pericardial effusion -Personally reviewed chest x-ray--bilateral pleural effusions, right greater than left.  Increased vascular congestion  SIRS -Patient presented with fever elevated  lactic acid and respiratory failure -Suspect pulmonary source -Blood cultures x2 sets -Check procalcitonin -Lactic acid peaked 6.59 -Continue vancomycin and cefepime pending culture data  COPD exacerbation -Start Pulmicort -Continue Xopenex and Atrovent -Continue IV Solu-Medrol  Acute metabolic encephalopathy -Multifactorial including medications, respiratory failure, infectious process -Patient remains somnolent -TSH -Serum B12  Elevated troponin -No chest pain presently -Secondary to demand ischemia -Trend is flat -Personally reviewed EKG--sinus rhythm, nonspecific T wave change -Echocardiogram  LV thrombus -Initially noted on echo 09/16/2017 -09/17/2017 cardiac MRI--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  Atrial fibrillation, type unspecified -Presently in sinus rhythm -Continue warfarin -CHADSVASc = 5 (CHF, HTN, stroke, female)  Essential hypertension -Convert medications to IV until the patient is safe to tolerate po -Start IV metoprolol  CKD stage III -Baseline creatinine 1.1-1.4 -Presenting creatinine 1.43  Lactic acidosis -Multifactorial including infection as well as hypoxia -Review of medical record shows that the patient has had chronically elevated lactic acid     Disposition Plan:   Home in 2-3 days  Family Communication:  No Family at bedside  Consultants:  none  Code Status:  FULL   DVT Prophylaxis:  coumadin   Procedures: As Listed in Progress Note Above  Antibiotics: vanco 6/7>>> Cefepime 6/8>>> Zosyn 6/7  The patient is critically ill with multiple organ systems failure and requires high complexity decision making for assessment and support, frequent evaluation and titration of therapies, application of advanced monitoring technologies and extensive interpretation of multiple databases.  Critical care time - 45 mins.     Subjective: Patient arouses to voice stimuli, but falls  asleep without further stimulation.  She denies any  chest pain, shortness breath, abdominal pain.  Remainder review of systems limited secondary to the patient's somnolence.  Objective: Vitals:   12/01/17 0445 12/01/17 0500 12/01/17 0515 12/01/17 0600  BP: (!) 148/97 (!) 170/90 (!) 165/95 (!) 150/80  Pulse: 70 92 81 76  Resp: (!) 28 (!) 25 (!) 26 (!) 26  Temp:  98.8 F (37.1 C) 99.5 F (37.5 C) 99.1 F (37.3 C)  TempSrc:      SpO2: 98% 97% 100% 99%  Weight:      Height:        Intake/Output Summary (Last 24 hours) at 12/01/2017 0711 Last data filed at 12/01/2017 0536 Gross per 24 hour  Intake 1250 ml  Output -  Net 1250 ml   Weight change:  Exam:   General:  Pt is alert, follows commands appropriately, not in acute distress  HEENT: No icterus, No thrush, No neck mass, [place]/AT  Cardiovascular: RRR, S1/S2, no rubs, no gallops  Respiratory: Diminished breath sounds right base; bibasilar crackles; mild bibasilar wheezing.  Abdomen: Soft/+BS, non tender, non distended, no guarding  Extremities: 2+ lower extremity edema, No lymphangitis, No petechiae, No rashes, no synovitis   Data Reviewed: I have personally reviewed following labs and imaging studies Basic Metabolic Panel: Recent Labs  Lab 12/01/17 0005 12/01/17 0253  NA 138  --   K 4.0  --   CL 104  --   CO2 19*  --   GLUCOSE 187*  --   BUN 13  --   CREATININE 1.43*  --   CALCIUM 9.4  --   MG  --  1.7  PHOS  --  4.3   Liver Function Tests: Recent Labs  Lab 12/01/17 0005  AST 17  ALT 14  ALKPHOS 81  BILITOT 1.8*  PROT 7.6  ALBUMIN 3.4*   No results for input(s): LIPASE, AMYLASE in the last 168 hours. No results for input(s): AMMONIA in the last 168 hours. Coagulation Profile: Recent Labs  Lab 12/01/17 0253  INR 3.96   CBC: Recent Labs  Lab 12/01/17 0005  WBC 11.1*  NEUTROABS 7.4  HGB 11.0*  HCT 39.9  MCV 78.5  PLT 518*   Cardiac Enzymes: Recent Labs  Lab 12/01/17 0005 12/01/17 0254  TROPONINI 0.09* 0.10*   BNP: Invalid input(s):  POCBNP CBG: No results for input(s): GLUCAP in the last 168 hours. HbA1C: No results for input(s): HGBA1C in the last 72 hours. Urine analysis:    Component Value Date/Time   COLORURINE AMBER (A) 12/01/2017 0005   APPEARANCEUR HAZY (A) 12/01/2017 0005   LABSPEC 1.016 12/01/2017 0005   PHURINE 5.0 12/01/2017 0005   GLUCOSEU NEGATIVE 12/01/2017 0005   HGBUR NEGATIVE 12/01/2017 0005   BILIRUBINUR NEGATIVE 12/01/2017 0005   KETONESUR NEGATIVE 12/01/2017 0005   PROTEINUR 100 (A) 12/01/2017 0005   NITRITE NEGATIVE 12/01/2017 0005   LEUKOCYTESUR NEGATIVE 12/01/2017 0005   Sepsis Labs: @LABRCNTIP (procalcitonin:4,lacticidven:4) ) Recent Results (from the past 240 hour(s))  Blood Culture (routine x 2)     Status: None (Preliminary result)   Collection Time: 11/30/17 11:50 PM  Result Value Ref Range Status   Specimen Description LEFT ANTECUBITAL  Final   Special Requests   Final    BOTTLES DRAWN AEROBIC AND ANAEROBIC Blood Culture adequate volume Performed at [facility] [place], [address], [place], [place] [address]    Culture PENDING  Incomplete   Report Status PENDING  Incomplete  Blood Culture (  routine x 2)     Status: None (Preliminary result)   Collection Time: 12/01/17 12:03 AM  Result Value Ref Range Status   Specimen Description BLOOD RIGHT FOREARM  Final   Special Requests   Final    BOTTLES DRAWN AEROBIC AND ANAEROBIC Blood Culture adequate volume Performed at [facility], [address], [place], [place] [address]    Culture PENDING  Incomplete   Report Status PENDING  Incomplete     Scheduled Meds: . carvedilol  12.5 mg Oral BID WC  . chlorhexidine  15 mL Mouth Rinse BID  . guaiFENesin  600 mg Oral BID  . hydrALAZINE  25 mg Oral TID  . ipratropium  0.5 mg Nebulization Q6H  . levalbuterol  1.25 mg Nebulization Q6H  . mouth rinse  15 mL Mouth Rinse q12n4p  . methylPREDNISolone (SOLU-MEDROL) injection  40 mg Intravenous Q6H  . pantoprazole  40 mg Oral Q1200  .  potassium chloride SA  20 mEq Oral Daily   Continuous Infusions: . ceFEPime (MAXIPIME) IV Stopped (12/01/17 0536)    Procedures/Studies: Dg Chest 1 View  Result Date: 11/01/2017 CLINICAL DATA:  RIGHT pleural effusion post thoracentesis EXAM: CHEST  1 VIEW COMPARISON:  Earlier study of 11/01/2017 FINDINGS: Expiratory technique. Decreased RIGHT pleural effusion post thoracentesis. No pneumothorax. Bibasilar atelectasis. Mild enlargement of cardiac silhouette. Atherosclerotic calcification aorta. Upper lungs clear. BILATERAL breast prostheses with capsular calcification at LEFT prosthesis. Bones demineralized. IMPRESSION: No pneumothorax following RIGHT thoracentesis. Electronically Signed   By: Lavonia Dana M.D.   On: 11/01/2017 17:11   Dg Chest Port 1 View  Result Date: 12/01/2017 CLINICAL DATA:  Code sepsis with nonproductive cough and shortness-of-breath as well as anxiety today. EXAM: PORTABLE CHEST 1 VIEW COMPARISON:  11/01/2017 FINDINGS: Lungs are hypoinflated with hazy prominence of the perihilar markings with mild bibasilar opacification. Findings likely due to mild vascular congestion with small bilateral pleural effusions. Vascular congestion unchanged as effusion slightly improved. Cardiomediastinal silhouette and remainder of the exam is unchanged. IMPRESSION: Findings suggesting mild vascular congestion without significant change and small bilateral pleural effusions slightly improved. Electronically Signed   By: Marin Olp M.D.   On: 12/01/2017 00:50   Dg Chest Port 1 View  Result Date: 11/01/2017 CLINICAL DATA:  Sepsis. EXAM: PORTABLE CHEST 1 VIEW COMPARISON:  Radiographs of Oct 31, 2017. FINDINGS: Stable cardiomediastinal silhouette. Stable moderate bibasilar edema or atelectasis is noted with moderate pleural effusions. No pneumothorax is noted. Bony thorax is unremarkable. IMPRESSION: Stable bilateral pleural effusions with associated atelectasis or edema. Electronically Signed   By:  Marijo Conception, M.D.   On: 11/01/2017 11:42   US Thoracentesis Asp Pleural Space W/img Guide  Result Date: 11/01/2017 INDICATION: RIGHT pleural effusion, shortness of breath, history atrial fibrillation, CHF, COPD, stroke, former smoker EXAM: ULTRASOUND GUIDED DIAGNOSTIC AND THERAPEUTIC RIGHT THORACENTESIS MEDICATIONS: None. COMPLICATIONS: None immediate. PROCEDURE: Procedure, benefits, and risks of procedure were discussed with patient. Written informed consent for procedure was obtained. Time out protocol followed. Pleural effusion localized by ultrasound at the posterior RIGHT hemithorax. Skin prepped and draped in usual sterile fashion. Skin and soft tissues anesthetized with 10 mL of 1% lidocaine. 8 French thoracentesis catheter placed into the RIGHT pleural space. 900 mL of clear yellow fluid aspirated by syringe pump. Procedure tolerated well by patient without immediate complication. FINDINGS: A total of approximately 900 mL of RIGHT pleural fluid was removed. Samples were sent to the laboratory as requested by the clinical team. IMPRESSION: Successful ultrasound  guided RIGHT thoracentesis yielding 900 mL of pleural fluid. Electronically Signed   By: Lavonia Dana M.D.   On: 11/01/2017 17:19    Orson Eva, DO  [facility] Pager [phone]  If 7PM-7AM, please contact night-coverage [email or web address] Password [email or web address] 12/01/2017, 7:11 AM   LOS: 0 days

## 2017-12-01 NOTE — ED Notes (Signed)
CRITICAL VALUE ALERT  Critical Value:  Troponin 0.09  Date & Time Notied:  12/01/2017 0052  Provider Notified: [email or web address]  Orders Received/Actions taken: md notified

## 2017-12-01 NOTE — ED Provider Notes (Addendum)
[facility] Provider Note   CSN: [id number] Arrival date & time: 11/30/17  2323     History   Chief Complaint No chief complaint on file.   HPI Glenda Johnson is a 62 y.o. female.  Patient is a 62 year old female with extensive past medical history including CHF, COPD, hypertension, and atrial fibrillation.  She presents today for evaluation of shortness of breath and cough that is nonproductive.  She states that she feels sputum in her chest, however "cannot cough it up".  She is felt fevered at home and has a temperature here of 100.9.  She denies any chest pain, urinary complaints, vomiting, or diarrhea.  She has been doing home nebulizer treatments with some relief.  She has a set up for oxygen at home, however does not use it because she says that it "knocks out the power in her house".  It appears as though she was recently admitted for an exacerbation of COPD/CHF.  The history is provided by the patient.  Shortness of Breath  This is a recurrent problem. The average episode lasts 24 hours. The problem occurs continuously.The problem has been rapidly worsening. Associated symptoms include a fever, cough and leg swelling. Pertinent negatives include no sputum production, no hemoptysis and no chest pain. Treatments tried: nebulizer. The treatment provided moderate relief. Associated medical issues include COPD, pneumonia and heart failure.    Past Medical History:  Diagnosis Date  . Atrial fibrillation ([place])   . CHF (congestive heart failure) ([place])   . COPD (chronic obstructive pulmonary disease) ([place])   . Hepatitis C   . Pulmonary thrombosis ([place])   . Renal artery stenosis ([place])   . Secondary hypertension   . Stroke ([place])   . Tobacco abuse     Patient Active Problem List   Diagnosis Date Noted  . Severe sepsis ([place]) 11/01/2017  . Tobacco abuse   . Renal artery stenosis ([place])   . Stroke ([place])   . Secondary hypertension   . COPD exacerbation ([place])  10/31/2017  . Atrial fibrillation ([place])   . CKD (chronic kidney disease) stage 3, GFR 30-59 ml/min (HCC) 10/09/2017  . Hypokalemia 10/09/2017  . Dyspnea 10/09/2017  . CHF (congestive heart failure) ([place]) 10/09/2017  . Cerebral thrombosis with cerebral infarction 09/20/2017  . Congestive heart failure ([place])   . Cerebrovascular accident (CVA) due to embolism of cerebral artery ([place])   . Cardiac mass   . Anticoagulation adequate   . Flash pulmonary edema ([place]) 09/15/2017  . Community acquired pneumonia   . Abnormal echocardiogram   . Hypoxia 05/22/2016  . Acute respiratory failure with hypoxia ([place]) 05/22/2016  . Acute respiratory failure ([place]) 05/22/2016  . Sepsis ([place]) 05/22/2016  . Elevated troponin 05/22/2016  . COPD (chronic obstructive pulmonary disease) ([place]) 05/22/2016  . AKI (acute kidney injury) ([place]) 05/22/2016  . Acute pulmonary edema [facility])     Past Surgical History:  Procedure Laterality Date  . BACK SURGERY    . CARDIAC CATHETERIZATION N/A 05/25/2016   Procedure: Left Heart Cath and Coronary Angiography;  Surgeon: Lorretta Harp, MD;  Location: [facility];  Service: Cardiovascular;  Laterality: N/A;  . PLACEMENT OF BREAST IMPLANTS Bilateral   . stent renal artery       OB History    Gravida  1   Para  1   Term  1   Preterm      AB      Living  SAB      TAB      Ectopic      Multiple      Live Births               Home Medications    Prior to Admission medications   Medication Sig Start Date End Date Taking? Authorizing Provider  albuterol (PROVENTIL HFA;VENTOLIN HFA) 108 (90 Base) MCG/ACT inhaler Inhale 2 puffs into the lungs every 4 (four) hours as needed. 09/10/17 09/10/18  [provider]  albuterol (PROVENTIL) (2.5 MG/3ML) 0.083% nebulizer solution Take 3 mLs (2.5 mg total) by nebulization every 6 (six) hours as needed for wheezing. 11/05/17   Kathie Dike, MD  carvedilol (COREG) 12.5 MG tablet Take 1 tablet  (12.5 mg total) by mouth 2 (two) times daily with a meal. 09/24/17   Velvet Bathe, MD  Fluticasone-Umeclidin-Vilant (TRELEGY ELLIPTA) 100-62.5-25 MCG/INH AEPB Inhale 1 puff into the lungs daily.  10/30/17   [provider]  furosemide (LASIX) 40 MG tablet Take 1 tablet (40 mg total) by mouth 2 (two) times daily. 11/05/17 12/05/17  Kathie Dike, MD  guaiFENesin (MUCINEX) 600 MG 12 hr tablet Take 1 tablet (600 mg total) by mouth 2 (two) times daily. 11/05/17 11/05/18  Kathie Dike, MD  hydrALAZINE (APRESOLINE) 25 MG tablet Take 1 tablet (25 mg total) by mouth 3 (three) times daily. 11/05/17   Kathie Dike, MD  LORazepam (ATIVAN) 0.5 MG tablet Take 0.5 mg by mouth every 8 (eight) hours.    [provider]  nitroGLYCERIN (NITROSTAT) 0.4 MG SL tablet Place 0.4 mg under the tongue every 5 (five) minutes as needed for chest pain.    [provider]  pantoprazole (PROTONIX) 40 MG tablet Take 1 tablet (40 mg total) by mouth daily at 12 noon. 09/25/17   Velvet Bathe, MD  potassium chloride SA (K-DUR,KLOR-CON) 20 MEQ tablet Take 2 tablets (40 mEq total) by mouth daily. Patient taking differently: Take 20 mEq by mouth daily.  10/10/17 11/09/17  Murlean Iba, MD  predniSONE (DELTASONE) 10 MG tablet Take 40mg  po daily for 2 days then 30mg  daily for 2 days then 20mg  daily for 2 days then 10mg  daily for 2 days then stop 11/05/17   Kathie Dike, MD  warfarin (COUMADIN) 5 MG tablet Take 1 tablet (5 mg total) by mouth daily. Patient taking differently: Take 2.5 mg by mouth daily.  09/24/17   Velvet Bathe, MD    Family History Family History  Problem Relation Age of Onset  . Diabetes Maternal Grandmother     Social History Social History   Tobacco Use  . Smoking status: Former Research scientist (life sciences)  . Smokeless tobacco: Never Used  Substance Use Topics  . Alcohol use: No  . Drug use: No     Allergies   Codeine; Tramadol; Clarithromycin; Citalopram; Fish allergy; Other; and Wellbutrin  [bupropion]   Review of Systems Review of Systems  Constitutional: Positive for fever.  Respiratory: Positive for cough and shortness of breath. Negative for hemoptysis and sputum production.   Cardiovascular: Positive for leg swelling. Negative for chest pain.  All other systems reviewed and are negative.    Physical Exam Updated Vital Signs There were no vitals taken for this visit.  Physical Exam  Constitutional: She is oriented to person, place, and time. She appears distressed.  Patient appears anxious and chronically ill.  She appears older than stated age.  HENT:  Head: Normocephalic and atraumatic.  Neck: Normal range of motion. Neck supple.  Cardiovascular: Normal rate and regular rhythm. Exam reveals no gallop and no friction rub.  No murmur heard. Pulmonary/Chest: Effort normal. No respiratory distress. She has no wheezes. She has rales.  There are rales in the bases bilaterally.  Abdominal: Soft. Bowel sounds are normal. She exhibits no distension. There is no tenderness.  Musculoskeletal: Normal range of motion. She exhibits edema and tenderness.  There is 2-3+ pitting edema of both lower extremities.  Neurological: She is alert and oriented to person, place, and time.  Skin: Skin is warm and dry. She is not diaphoretic.  Nursing note and vitals reviewed.    ED Treatments / Results  Labs (all labs ordered are listed, but only abnormal results are displayed) Labs Reviewed  I-STAT CG4 LACTIC ACID, ED - Abnormal; Notable for the following components:      Result Value   Lactic Acid, Venous 6.59 (*)    All other components within normal limits  CULTURE, BLOOD (ROUTINE X 2)  CULTURE, BLOOD (ROUTINE X 2)  URINE CULTURE  COMPREHENSIVE METABOLIC PANEL  CBC WITH DIFFERENTIAL/PLATELET  URINALYSIS, ROUTINE W REFLEX MICROSCOPIC  TROPONIN I  BRAIN NATRIURETIC PEPTIDE  I-STAT CG4 LACTIC ACID, ED    EKG EKG Interpretation  Date/Time:  Friday November 30 2017  23:35:29 EDT Ventricular Rate:  93 PR Interval:    QRS Duration: 80 QT Interval:  286 QTC Calculation: 356 R Axis:   100 Text Interpretation:  Sinus tachycardia Multiple premature atrial complexes Borderline Right axis deviation Low voltage, precordial leads Minimal ST depression, anterolateral leads Confirmed by Veryl Speak [phone]) on 11/30/2017 11:56:19 PM   Radiology No results found.  Procedures Procedures (including critical care time)  Medications Ordered in ED Medications  vancomycin (VANCOCIN) IVPB 1000 mg/200 mL premix (has no administration in time range)  piperacillin-tazobactam (ZOSYN) IVPB 3.375 g (has no administration in time range)  sodium chloride 0.9 % bolus 1,000 mL (has no administration in time range)     Initial Impression / Assessment and Plan / ED Course  I have reviewed the triage vital signs and the nursing notes.  Pertinent labs & imaging results that were available during my care of the patient were reviewed by me and considered in my medical decision making (see chart for details).  Patient with history of both CHF and COPD presenting with shortness of breath.  Her breathing difficulties appear multifactorial.  She has low-grade fever of 100.9.  Initial lactate was 6.6.  Due to her history of CHF and elevated BNP, I have refrained from sepsis fluid protocol.  I did administer 1 L of normal saline cautiously.  Vancomycin and Zosyn given for presumed HCAP due to her recent admission last month.  She was also given steroids and breathing treatments.  She will be admitted to the hospitalist service for further treatment.  Dr. Olevia Bowens agrees to admit.  CRITICAL CARE Performed by: Veryl Speak Total critical care time: 35 minutes Critical care time was exclusive of separately billable procedures and treating other patients. Critical care was necessary to treat or prevent imminent or life-threatening deterioration. Critical care was time spent personally by  me on the following activities: development of treatment plan with patient and/or surrogate as well as nursing, discussions with consultants, evaluation of patient's response to treatment, examination of patient, obtaining history from patient or surrogate, ordering and performing treatments and interventions, ordering and review of laboratory studies, ordering and review of radiographic studies, pulse oximetry and re-evaluation of patient's condition.    Final  Clinical Impressions(s) / ED Diagnoses   Final diagnoses:  None    ED Discharge Orders    None       Veryl Speak, MD 12/01/17 [id number]    Veryl Speak, MD 12/11/17 [phone]

## 2017-12-01 NOTE — Progress Notes (Signed)
*  PRELIMINARY RESULTS* Echocardiogram 2D Echocardiogram has been performed with Definity.  Samuel Germany 12/01/2017, 2:41 PM

## 2017-12-01 NOTE — Progress Notes (Signed)
ANTICOAGULATION CONSULT NOTE - Initial Consult  Pharmacy Consult for Coumadin Indication: atrial fibrillation  Allergies  Allergen Reactions  . Codeine Anaphylaxis  . Tramadol Anaphylaxis    No hydrocodone or anything connected No hydrocodone or anything connected   . Clarithromycin Nausea And Vomiting  . Citalopram Other (See Comments)    Caused more depression   . Fish Allergy Itching    ears  . Other     Patient says that all narcotics give her anaphylaxis.   . Wellbutrin [Bupropion] Other (See Comments)    "could not speak a full sentence for a whole year"    Patient Measurements: Height: 5\' 4"  (162.6 cm) Weight: 149 lb 4 oz (67.7 kg) IBW/kg (Calculated) : 54.7  Vital Signs: Temp: 98.2 F (36.8 C) (06/08 0700) Temp Source: Rectal (06/08 0335) BP: 162/98 (06/08 0700) Pulse Rate: 29 (06/08 0700)  Labs: Recent Labs    12/01/17 0005 12/01/17 0253 12/01/17 0254  HGB 11.0*  --   --   HCT 39.9  --   --   PLT 518*  --   --   LABPROT  --  38.4*  --   INR  --  3.96  --   CREATININE 1.43*  --   --   TROPONINI 0.09*  --  0.10*    Estimated Creatinine Clearance: 38.1 mL/min (A) (by C-G formula based on SCr of 1.43 mg/dL (H)).   Medical History: Past Medical History:  Diagnosis Date  . Atrial fibrillation ([place])   . CHF (congestive heart failure) ([place])   . COPD (chronic obstructive pulmonary disease) ([place])   . Hepatitis C   . Pulmonary thrombosis ([place])   . Renal artery stenosis ([place])   . Secondary hypertension   . Stroke ([place])   . Tobacco abuse     Medications:  Medications Prior to Admission  Medication Sig Dispense Refill Last Dose  . albuterol (PROVENTIL HFA;VENTOLIN HFA) 108 (90 Base) MCG/ACT inhaler Inhale 2 puffs into the lungs every 4 (four) hours as needed.   Past Week at Unknown time  . albuterol (PROVENTIL) (2.5 MG/3ML) 0.083% nebulizer solution Take 3 mLs (2.5 mg total) by nebulization every 6 (six) hours as needed for wheezing. 75 mL 12   .  carvedilol (COREG) 12.5 MG tablet Take 1 tablet (12.5 mg total) by mouth 2 (two) times daily with a meal.   10/30/2017 at 2000  . Fluticasone-Umeclidin-Vilant (TRELEGY ELLIPTA) 100-62.5-25 MCG/INH AEPB Inhale 1 puff into the lungs daily.    10/30/2017 at Unknown time  . furosemide (LASIX) 40 MG tablet Take 1 tablet (40 mg total) by mouth 2 (two) times daily. 60 tablet 0   . guaiFENesin (MUCINEX) 600 MG 12 hr tablet Take 1 tablet (600 mg total) by mouth 2 (two) times daily. 60 tablet 2   . hydrALAZINE (APRESOLINE) 25 MG tablet Take 1 tablet (25 mg total) by mouth 3 (three) times daily. 90 tablet 0   . LORazepam (ATIVAN) 0.5 MG tablet Take 0.5 mg by mouth every 8 (eight) hours.   10/31/2017 at Unknown time  . nitroGLYCERIN (NITROSTAT) 0.4 MG SL tablet Place 0.4 mg under the tongue every 5 (five) minutes as needed for chest pain.   unknown  . pantoprazole (PROTONIX) 40 MG tablet Take 1 tablet (40 mg total) by mouth daily at 12 noon.   10/30/2017 at Unknown time  . potassium chloride SA (K-DUR,KLOR-CON) 20 MEQ tablet Take 2 tablets (40 mEq total) by mouth daily. (Patient taking differently: Take  20 mEq by mouth daily. ) 60 tablet 0 10/30/2017 at Unknown time  . predniSONE (DELTASONE) 10 MG tablet Take 40mg  po daily for 2 days then 30mg  daily for 2 days then 20mg  daily for 2 days then 10mg  daily for 2 days then stop 20 tablet 0   . warfarin (COUMADIN) 5 MG tablet Take 1 tablet (5 mg total) by mouth daily. (Patient taking differently: Take 2.5 mg by mouth daily. ) 10 tablet 0 10/30/2017 at 8-1000a    Assessment: 62 year old female with history of COPD, diastolic CHF, atrial fibrillation, hep C, LV thrombus, stroke, continued tobacco abuse, hypertension, and anxiety presenting with shortness of breath and nonproductive cough.  The patient was recently discharged from the hospital after a stay from 10/31/2017 through 11/05/2017 when she was treated for COPD exacerbation and sepsis secondary to pneumonia.  Pharmacy asked to  dose Coumadin. INR is supratherapeutic. Home dose is 2.5mg  daily.  Goal of Therapy:  INR 2-3 Monitor platelets by anticoagulation protocol: Yes   Plan:  No coumadin today Daily PT-INR Monitor for S/S of bleeding  Isac Sarna, BS Vena [place], BCPS Clinical Pharmacist Pager [phone] 12/01/2017,8:19 AM

## 2017-12-01 NOTE — Progress Notes (Signed)
Nutrition Brief Note  Patient screened remotely as part of COPD protocol.   Wt Readings from Last 15 Encounters:  12/01/17 149 lb 4 oz (67.7 kg)  11/06/17 132 lb 4.8 oz (60 kg)  10/19/17 136 lb (61.7 kg)  10/09/17 136 lb 3.2 oz (61.8 kg)  09/22/17 139 lb 12.4 oz (63.4 kg)  06/13/16 121 lb 8 oz (55.1 kg)  05/26/16 121 lb 11.2 oz (55.2 kg)   Patient admitted at 149 lbs. She was discharged from hospital 5/14 at weight of 132 lbs. Suspect wt gain more fluid related than intake related. Question sodium restriction compliance (last admission she had been adamant she be taken off a low sodium diet and placed on a Regular diet due to not having access to items she requested).   Patient did not present with any reports of decreased intake/appetite. Her MST score was 0.  At this time, there is no documented PO intake, but she remained NPO until just a few minutes ago. Will monitor PO intake and follow up as needed. If patient agreeable to low salt education, this can be done next week.   Burtis Junes RD, LDN, CNSC Clinical Nutrition Available Tues-Sat via Pager: [phone] 12/01/2017 5:49 PM

## 2017-12-01 NOTE — H&P (Signed)
History and Physical    Glenda Johnson FAO:[id number] DOB: 09/26/1955 DOA: 11/30/2017  PCP: Glenda Housekeeper, MD   Patient coming from: Home.  I have personally briefly reviewed patient's old medical records in [place]  Chief Complaint: Shortness of breath and anxiety.  HPI: Glenda Johnson is a 62 y.o. female with medical history significant of atrial fibrillation, chronic diastolic heart failure, COPD, hep C, history of pulmonary thrombosis, renal artery stenosis, secondary hypertension, history of CVA, tobacco use, history of cardiac mass who is coming to the emergency department with complaints of shortness of breath and anxiety.  She mentioned earlier to Glenda Johnson that she has been coughing a lot but this is nonproductive.  When I arrived to the room, the patient was restless, mildly cyanotic with an O2 sat of 76% on of oxygen after she took her nasal cannula off. She had recently given lorazepam 1 mg IVP as well.  She is somnolent, mildly hypoxic, confused and unable to provide further history.  ED Course: Initial temperature 101.7 F, pulse 105, respirations 30, blood pressure 166/92 mmHg and O2 sat 96% on unknown FiO2.  She was given Zosyn and vancomycin.  She was given a partial normal saline bolus, but this was stopped due to acute CHF.  I ordered BiPAP ventilation, Nitropaste to ACW, Solu-Medrol 125 mg IVP x1, Xopenex plus ipratropium neb and 2 g of magnesium sulfate.  Her lab work shows a white count of 11.1 with 67% neutrophils, 21% lymphocytes and 11% monocytes.  Myoglobin was 11.0 g/dL and platelets 518.  Lactic acid #1 was 6.59 and lactic acid number two 4.8 mmol/L.  Troponin #1 was 0.09 and troponin #2 was 0.010 ng/mL.  BNP was over 4500 pg/mL.  ABG showed a pH of 7.29, PCO2 of 28.7 and PO2 of 345 while on BiPAP with a 100% FiO2.\  Imaging: A 1 view chest radiograph shows mild vascular congestion and small bilateral pleural effusions, which were reported to be slightly  improved.  Please see image and full radiology report for further detail.  Review of Systems: Unable to obtain due to the patient's current mental status..   Past Medical History:  Diagnosis Date  . Atrial fibrillation ([place])   . CHF (congestive heart failure) ([place])   . COPD (chronic obstructive pulmonary disease) ([place])   . Hepatitis C   . Pulmonary thrombosis ([place])   . Renal artery stenosis ([place])   . Secondary hypertension   . Stroke ([place])   . Tobacco abuse     Past Surgical History:  Procedure Laterality Date  . BACK SURGERY    . CARDIAC CATHETERIZATION N/A 05/25/2016   Procedure: Left Heart Cath and Coronary Angiography;  Surgeon: Glenda Harp, MD;  Location: [facility];  Service: Cardiovascular;  Laterality: N/A;  . PLACEMENT OF BREAST IMPLANTS Bilateral   . stent renal artery       reports that she has quit smoking. She has never used smokeless tobacco. She reports that she does not drink alcohol or use drugs.  Allergies  Allergen Reactions  . Codeine Anaphylaxis  . Tramadol Anaphylaxis    No hydrocodone or anything connected No hydrocodone or anything connected   . Clarithromycin Nausea And Vomiting  . Citalopram Other (See Comments)    Caused more depression   . Fish Allergy Itching    ears  . Other     Patient says that all narcotics give her anaphylaxis.   [name]  Wellbutrin [Bupropion] Other (See Comments)    "could not speak a full sentence for a whole year"    Family History  Problem Relation Age of Onset  . Diabetes Maternal Grandmother    Prior to Admission medications   Medication Sig Start Date End Date Taking? Authorizing Provider  albuterol (PROVENTIL HFA;VENTOLIN HFA) 108 (90 Base) MCG/ACT inhaler Inhale 2 puffs into the lungs every 4 (four) hours as needed. 09/10/17 09/10/18  [provider]  albuterol (PROVENTIL) (2.5 MG/3ML) 0.083% nebulizer solution Take 3 mLs (2.5 mg total) by nebulization every 6 (six) hours as needed for  wheezing. 11/05/17   Glenda Dike, MD  carvedilol (COREG) 12.5 MG tablet Take 1 tablet (12.5 mg total) by mouth 2 (two) times daily with a meal. 09/24/17   Glenda Bathe, MD  Fluticasone-Umeclidin-Vilant (TRELEGY ELLIPTA) 100-62.5-25 MCG/INH AEPB Inhale 1 puff into the lungs daily.  10/30/17   [provider]  furosemide (LASIX) 40 MG tablet Take 1 tablet (40 mg total) by mouth 2 (two) times daily. 11/05/17 12/05/17  Glenda Dike, MD  guaiFENesin (MUCINEX) 600 MG 12 hr tablet Take 1 tablet (600 mg total) by mouth 2 (two) times daily. 11/05/17 11/05/18  Glenda Dike, MD  hydrALAZINE (APRESOLINE) 25 MG tablet Take 1 tablet (25 mg total) by mouth 3 (three) times daily. 11/05/17   Glenda Dike, MD  LORazepam (ATIVAN) 0.5 MG tablet Take 0.5 mg by mouth every 8 (eight) hours.    [provider]  nitroGLYCERIN (NITROSTAT) 0.4 MG SL tablet Place 0.4 mg under the tongue every 5 (five) minutes as needed for chest pain.    [provider]  pantoprazole (PROTONIX) 40 MG tablet Take 1 tablet (40 mg total) by mouth daily at 12 noon. 09/25/17   Glenda Bathe, MD  potassium chloride SA (K-DUR,KLOR-CON) 20 MEQ tablet Take 2 tablets (40 mEq total) by mouth daily. Patient taking differently: Take 20 mEq by mouth daily.  10/10/17 11/09/17  Glenda Iba, MD  predniSONE (DELTASONE) 10 MG tablet Take 40mg  po daily for 2 days then 30mg  daily for 2 days then 20mg  daily for 2 days then 10mg  daily for 2 days then stop 11/05/17   Glenda Dike, MD  warfarin (COUMADIN) 5 MG tablet Take 1 tablet (5 mg total) by mouth daily. Patient taking differently: Take 2.5 mg by mouth daily.  09/24/17   Glenda Bathe, MD    Physical Exam: Vitals:   12/01/17 0122 12/01/17 0130 12/01/17 0221 12/01/17 0245  BP: (!) 160/98  (!) 147/94 138/79  Pulse: 93 89 83 (!) 135  Resp:  (!) 40 (!) 40 (!) 39  Temp:   (!) 101.7 F (38.7 C)   TempSrc:   Rectal   SpO2: (!) 30% 100% 100% 100%    Constitutional: Restless.   Looks acutely ill. Eyes: PERRL, lids and conjunctivae normal ENMT: Mucous membranes are moist. Posterior pharynx clear of any exudate or lesions. Neck: Normal, supple, no masses, no thyromegaly Respiratory: Tachypneic, decreased breath sounds bilaterally with bibasilar rales and bilateral wheezing.  Initially there was supraclavicular muscle use.  None after BiPAP. Cardiovascular: S1, S2, occasionally irregularly irregular, no murmurs / rubs / gallops.  3+ lower extremities pitting edema. 2+ pedal pulses. No carotid bruits.  Abdomen: Soft, no tenderness, no masses palpated. No hepatosplenomegaly. Bowel sounds positive.  Musculoskeletal: no clubbing / cyanosis.  Good ROM, no contractures.  Mildly relaxed muscle tone.  Skin: no rashes, lesions, ulcers on limited skin examination. Neurologic: Sedated.  She was  moving all extremities earlier.  Unable to evaluate fully at this time. Psychiatric: Sedated with lorazepam, but still restless.  Points to tactile stimuli.  Unable to answer questions.   Labs on Admission: I have personally reviewed following labs and imaging studies  CBC: Recent Labs  Lab 12/01/17 0005  WBC 11.1*  NEUTROABS 7.4  HGB 11.0*  HCT 39.9  MCV 78.5  PLT 387*   Basic Metabolic Panel: Recent Labs  Lab 12/01/17 0005  NA 138  K 4.0  CL 104  CO2 19*  GLUCOSE 187*  BUN 13  CREATININE 1.43*  CALCIUM 9.4   GFR: CrCl cannot be calculated (Unknown ideal weight.). Liver Function Tests: Recent Labs  Lab 12/01/17 0005  AST 17  ALT 14  ALKPHOS 81  BILITOT 1.8*  PROT 7.6  ALBUMIN 3.4*   No results for input(s): LIPASE, AMYLASE in the last 168 hours. No results for input(s): AMMONIA in the last 168 hours. Coagulation Profile: No results for input(s): INR, PROTIME in the last 168 hours. Cardiac Enzymes: Recent Labs  Lab 12/01/17 0005  TROPONINI 0.09*   BNP (last 3 results) No results for input(s): PROBNP in the last 8760 hours. HbA1C: No results for  input(s): HGBA1C in the last 72 hours. CBG: No results for input(s): GLUCAP in the last 168 hours. Lipid Profile: No results for input(s): CHOL, HDL, LDLCALC, TRIG, CHOLHDL, LDLDIRECT in the last 72 hours. Thyroid Function Tests: No results for input(s): TSH, T4TOTAL, FREET4, T3FREE, THYROIDAB in the last 72 hours. Anemia Panel: No results for input(s): VITAMINB12, FOLATE, FERRITIN, TIBC, IRON, RETICCTPCT in the last 72 hours. Urine analysis:    Component Value Date/Time   COLORURINE STRAW (A) 10/09/2017 1325   APPEARANCEUR CLEAR 10/09/2017 1325   LABSPEC 1.004 (L) 10/09/2017 1325   PHURINE 8.0 10/09/2017 1325   GLUCOSEU NEGATIVE 10/09/2017 1325   HGBUR NEGATIVE 10/09/2017 1325   BILIRUBINUR NEGATIVE 10/09/2017 1325   KETONESUR NEGATIVE 10/09/2017 1325   PROTEINUR NEGATIVE 10/09/2017 1325   NITRITE NEGATIVE 10/09/2017 1325   LEUKOCYTESUR NEGATIVE 10/09/2017 1325    Radiological Exams on Admission: Dg Chest Port 1 View  Result Date: 12/01/2017 CLINICAL DATA:  Code sepsis with nonproductive cough and shortness-of-breath as well as anxiety today. EXAM: PORTABLE CHEST 1 VIEW COMPARISON:  11/01/2017 FINDINGS: Lungs are hypoinflated with hazy prominence of the perihilar markings with mild bibasilar opacification. Findings likely due to mild vascular congestion with small bilateral pleural effusions. Vascular congestion unchanged as effusion slightly improved. Cardiomediastinal silhouette and remainder of the exam is unchanged. IMPRESSION: Findings suggesting mild vascular congestion without significant change and small bilateral pleural effusions slightly improved. Electronically Signed   By: Marin Olp M.D.   On: 12/01/2017 00:50    EKG: Independently reviewed.  Vent. rate 93 BPM PR interval * ms QRS duration 80 ms QT/QTc 286/356 ms P-R-T axes 73 100 69 Sinus tachycardia Multiple premature atrial complexes Borderline Right axis deviation Low voltage, precordial leads Minimal ST  depression, anterolateral leads  Assessment/Plan Principal Problem:   Acute respiratory failure with hypoxia (HCC) Secondary to HCAP, CHF and COPD exacerbation. Continue oxygen supplementation and BiPAP ventilation. Continue COPD exacerbation treatment as below. Continue HCAP treatment as below. Continue acute diastolic CHF treatment as below.  Active Problems:   COPD exacerbation (HCC) Continue oxygen supplementation. Continue BiPAP ventilation. Xopenex plus ipratropium nebs every 6 hours. Solu-Medrol 125 mg IVP x1 dose. Solu-Medrol 40 mg IVP every 6 hours.    HCAP (healthcare-associated pneumonia) Cefepime 1 g every  8 hours. Vancomycin per pharmacy. Check sputum Gram stain, culture and sensitivity. Check strep pneumoniae urinary antigen. Follow-up blood culture and sensitivity.    Acute on chronic diastolic congestive heart failure (HCC)  Continue supplemental oxygen and BiPAP ventilation. Continue Nitropaste. Continue carvedilol.  Hold for 1 or 2 doses if no improvement. Diuretics and IVF were held due to fever and sepsis. May need pulse diuretic doses. Daily weights. Monitor intake and output. Monitor renal function and electrolytes.    Lactic acidosis Continue IV antibiotics. Continue BiPAP ventilation. Fluids were held in the ED due to volume overload picture. Follow-up lactic acid level.    Elevated troponin Secondary to demand ischemia. Continue cardiac monitoring. Trend troponin level. Check echocardiogram.    Atrial fibrillation (HCC) CHA?DS?-VASc Score of at least 6. Continue warfarin per pharmacy. Continue carvedilol for rate control.    Cardiac mass Was supposed to have echocardiogram done late last month. Continue warfarin. Check echocardiogram.     DVT prophylaxis: On warfarin. Code Status: Full code. Family Communication: None at bedside. Disposition Plan: Admit for respiratory support and IV antibiotics for several days. Consults  called:  Admission status: Inpatient/ICU.   Reubin Milan MD [facility] Pager [phone].  If 7PM-7AM, please contact night-coverage [email or web address] Password [email or web address]  12/01/2017, 3:15 AM

## 2017-12-02 LAB — CBC WITH DIFFERENTIAL/PLATELET
BASOS ABS: 0 10*3/uL (ref 0.0–0.1)
Basophils Relative: 0 %
EOS ABS: 0 10*3/uL (ref 0.0–0.7)
EOS PCT: 0 %
HCT: 32.8 % — ABNORMAL LOW (ref 36.0–46.0)
Hemoglobin: 9.4 g/dL — ABNORMAL LOW (ref 12.0–15.0)
Lymphocytes Relative: 10 %
Lymphs Abs: 0.8 10*3/uL (ref 0.7–4.0)
MCH: 21.9 pg — AB (ref 26.0–34.0)
MCHC: 28.7 g/dL — ABNORMAL LOW (ref 30.0–36.0)
MCV: 76.5 fL — ABNORMAL LOW (ref 78.0–100.0)
Monocytes Absolute: 0.5 10*3/uL (ref 0.1–1.0)
Monocytes Relative: 6 %
Neutro Abs: 7 10*3/uL (ref 1.7–7.7)
Neutrophils Relative %: 84 %
PLATELETS: 297 10*3/uL (ref 150–400)
RBC: 4.29 MIL/uL (ref 3.87–5.11)
RDW: 20.7 % — ABNORMAL HIGH (ref 11.5–15.5)
WBC: 8.3 10*3/uL (ref 4.0–10.5)

## 2017-12-02 LAB — PROTIME-INR
INR: 2.77
Prothrombin Time: 29 seconds — ABNORMAL HIGH (ref 11.4–15.2)

## 2017-12-02 LAB — BASIC METABOLIC PANEL
ANION GAP: 11 (ref 5–15)
BUN: 21 mg/dL — ABNORMAL HIGH (ref 6–20)
CHLORIDE: 105 mmol/L (ref 101–111)
CO2: 21 mmol/L — AB (ref 22–32)
Calcium: 8.8 mg/dL — ABNORMAL LOW (ref 8.9–10.3)
Creatinine, Ser: 1.24 mg/dL — ABNORMAL HIGH (ref 0.44–1.00)
GFR calc non Af Amer: 45 mL/min — ABNORMAL LOW (ref >60)
GFR, EST AFRICAN AMERICAN: 52 mL/min — AB (ref >60)
GLUCOSE: 161 mg/dL — AB (ref 65–99)
POTASSIUM: 3.5 mmol/L (ref 3.5–5.1)
Sodium: 137 mmol/L (ref 135–145)

## 2017-12-02 LAB — URINE CULTURE: CULTURE: NO GROWTH

## 2017-12-02 LAB — MAGNESIUM: Magnesium: 1.9 mg/dL (ref 1.7–2.4)

## 2017-12-02 LAB — PROCALCITONIN: Procalcitonin: 0.55 ng/mL

## 2017-12-02 MED ORDER — POTASSIUM CHLORIDE CRYS ER 20 MEQ PO TBCR
40.0000 meq | EXTENDED_RELEASE_TABLET | Freq: Every day | ORAL | Status: DC
  Administered 2017-12-02 – 2017-12-04 (×3): 40 meq via ORAL
  Filled 2017-12-02 (×4): qty 2

## 2017-12-02 MED ORDER — WARFARIN - PHARMACIST DOSING INPATIENT
Freq: Every day | Status: DC
Start: 2017-12-02 — End: 2017-12-05
  Administered 2017-12-02 – 2017-12-03 (×2)

## 2017-12-02 MED ORDER — WARFARIN SODIUM 2.5 MG PO TABS
2.5000 mg | ORAL_TABLET | Freq: Once | ORAL | Status: AC
  Administered 2017-12-02: 2.5 mg via ORAL
  Filled 2017-12-02: qty 1

## 2017-12-02 MED ORDER — LORAZEPAM 2 MG/ML IJ SOLN
0.5000 mg | Freq: Once | INTRAMUSCULAR | Status: AC
  Administered 2017-12-02: 0.5 mg via INTRAVENOUS
  Filled 2017-12-02: qty 1

## 2017-12-02 MED ORDER — ZOLPIDEM TARTRATE 5 MG PO TABS
5.0000 mg | ORAL_TABLET | Freq: Once | ORAL | Status: AC
  Administered 2017-12-02: 5 mg via ORAL
  Filled 2017-12-02: qty 1

## 2017-12-02 NOTE — Progress Notes (Signed)
Patient seems very sleepy Breathing and saturation appears ok , f 25-28 on 3 lpm 98. She is a little confused which appears from sleep depravation, don't suspect increased PCO2 but will watch for. Nursing has not sedated her. Will watch her. [name]

## 2017-12-02 NOTE — Progress Notes (Deleted)
Respirations increased to 40's not sure as why.

## 2017-12-02 NOTE — Progress Notes (Signed)
ANTICOAGULATION CONSULT NOTE - follow up [place] for Coumadin Indication: atrial fibrillation  Allergies  Allergen Reactions  . Codeine Anaphylaxis  . Tramadol Anaphylaxis    No hydrocodone or anything connected No hydrocodone or anything connected   . Clarithromycin Nausea And Vomiting  . Citalopram Other (See Comments)    Caused more depression   . Fish Allergy Itching    ears  . Other     Patient says that all narcotics give her anaphylaxis.   . Wellbutrin [Bupropion] Other (See Comments)    "could not speak a full sentence for a whole year"    Patient Measurements: Height: 5\' 4"  (162.6 cm) Weight: 132 lb 15 oz (60.3 kg) IBW/kg (Calculated) : 54.7  Vital Signs: Temp: 98.2 F (36.8 C) (06/09 0500) Temp Source: Oral (06/08 2000) BP: 150/95 (06/09 0500) Pulse Rate: 85 (06/09 0500)  Labs: Recent Labs    12/01/17 0005 12/01/17 0253 12/01/17 0254 12/01/17 0758 12/01/17 1445 12/02/17 0502  HGB 11.0*  --   --   --   --  9.4*  HCT 39.9  --   --   --   --  32.8*  PLT 518*  --   --   --   --  297  LABPROT  --  38.4*  --   --   --  29.0*  INR  --  3.96  --   --   --  2.77  CREATININE 1.43*  --   --  1.29*  --  1.24*  TROPONINI 0.09*  --  0.10* 0.15* 0.11*  --     Estimated Creatinine Clearance: 40.1 mL/min (A) (by C-G formula based on SCr of 1.24 mg/dL (H)).   Medical History: Past Medical History:  Diagnosis Date  . Atrial fibrillation ([place])   . CHF (congestive heart failure) ([place])   . COPD (chronic obstructive pulmonary disease) ([place])   . Hepatitis C   . Pulmonary thrombosis ([place])   . Renal artery stenosis ([place])   . Secondary hypertension   . Stroke ([place])   . Tobacco abuse     Medications:  Medications Prior to Admission  Medication Sig Dispense Refill Last Dose  . albuterol (PROVENTIL HFA;VENTOLIN HFA) 108 (90 Base) MCG/ACT inhaler Inhale 2 puffs into the lungs every 4 (four) hours as needed.   Past Week at Unknown time  .  albuterol (PROVENTIL) (2.5 MG/3ML) 0.083% nebulizer solution Take 3 mLs (2.5 mg total) by nebulization every 6 (six) hours as needed for wheezing. 75 mL 12   . carvedilol (COREG) 12.5 MG tablet Take 1 tablet (12.5 mg total) by mouth 2 (two) times daily with a meal.   10/30/2017 at 2000  . Fluticasone-Umeclidin-Vilant (TRELEGY ELLIPTA) 100-62.5-25 MCG/INH AEPB Inhale 1 puff into the lungs daily.    10/30/2017 at Unknown time  . furosemide (LASIX) 40 MG tablet Take 1 tablet (40 mg total) by mouth 2 (two) times daily. 60 tablet 0   . guaiFENesin (MUCINEX) 600 MG 12 hr tablet Take 1 tablet (600 mg total) by mouth 2 (two) times daily. 60 tablet 2   . hydrALAZINE (APRESOLINE) 25 MG tablet Take 1 tablet (25 mg total) by mouth 3 (three) times daily. 90 tablet 0   . LORazepam (ATIVAN) 0.5 MG tablet Take 0.5 mg by mouth every 8 (eight) hours.   10/31/2017 at Unknown time  . nitroGLYCERIN (NITROSTAT) 0.4 MG SL tablet Place 0.4 mg under the tongue every 5 (five) minutes as needed for  chest pain.   unknown  . pantoprazole (PROTONIX) 40 MG tablet Take 1 tablet (40 mg total) by mouth daily at 12 noon.   10/30/2017 at Unknown time  . potassium chloride SA (K-DUR,KLOR-CON) 20 MEQ tablet Take 2 tablets (40 mEq total) by mouth daily. (Patient taking differently: Take 20 mEq by mouth daily. ) 60 tablet 0 10/30/2017 at Unknown time  . predniSONE (DELTASONE) 10 MG tablet Take 40mg  po daily for 2 days then 30mg  daily for 2 days then 20mg  daily for 2 days then 10mg  daily for 2 days then stop 20 tablet 0   . warfarin (COUMADIN) 5 MG tablet Take 1 tablet (5 mg total) by mouth daily. (Patient taking differently: Take 2.5 mg by mouth daily. ) 10 tablet 0 10/30/2017 at 8-1000a    Assessment: 62 year old female with history of COPD, diastolic CHF, atrial fibrillation, hep C, LV thrombus, stroke, continued tobacco abuse, hypertension, and anxiety presenting with shortness of breath and nonproductive cough.  The patient was recently discharged  from the hospital after a stay from 10/31/2017 through 11/05/2017 when she was treated for COPD exacerbation and sepsis secondary to pneumonia.  Pharmacy asked to dose Coumadin. INR is therapeutic. Home dose is 2.5mg  daily.  Goal of Therapy:  INR 2-3 Monitor platelets by anticoagulation protocol: Yes   Plan:  Coumadin 2.5mg  today Daily PT-INR Monitor for S/S of bleeding  Isac Sarna, BS [place]ena [place], BCPS Clinical Pharmacist Pager [phone] 12/02/2017,7:59 AM

## 2017-12-02 NOTE — Progress Notes (Signed)
PROGRESS NOTE  Glenda Johnson RXV:[id number] DOB: June 01, 1956 DOA: 11/30/2017 PCP: [name], MD  Brief History:  62 year old female with history of COPD, diastolic CHF, atrial fibrillation, hep C, LV thrombus, stroke, continued tobacco abuse, hypertension, and anxiety presenting with shortness of breath and nonproductive cough for the past 1 to 2 weeks..  The patient also complained of fever up to 100.9 F at home.  There were no reports of vomiting, diarrhea, abdominal pain.  The patient was recently discharged from the hospital after a stay from 10/31/2017 through 11/05/2017 when she was treated for COPD exacerbation and sepsis secondary to pneumonia.  Patient was discharged home with 2 L nasal cannula.  However, she does not use it because she states it "knocks out the power in her house".     The patient endorses compliance with her medications, but endorses indiscretion with fluid intake.  She continues to smoke 1 to 2 cigarettes/day with a previous history of 80-pack-year history of tobacco. When the patient was evaluated by Dr. Olevia Bowens, she was somnolent and hypoxic.  The patient was placed on BiPAP and admitted to the stepdown unit.  At the time of admission, the patient was noted to have a fever 101.7 F with lactic acid of 6.59.  The patient was started on Solu-Medrol and IV fluids initially.  Initial troponin was 0.10.  W BC was 11.1.  She was started on vancomycin and cefepime.   Assessment/Plan: Acute on chronic respiratory failure with hypoxia -Secondary to decompensated CHF and COPD exacerbation -Presently on BiPAP>>>6L -Wean back to baseline 2 L -Pulmonary hygiene  Acute on chronic diastolic CHF -Continue furosemide 40 mg IV twice daily -Daily weights--question accuracy--NEG 17 lbs in 1 day? -NEG 2.2L in last 24 hours -Fluid restrict -restart po coreg -09/16/2017 echo EF 55-60%, grade 2 DD, no WMA, mild TR, trivial pericardial effusion -Personally reviewed chest  x-ray--bilateral pleural effusions, right greater than left.  Increased vascular congestion -12/01/2017 echo EF 50-55%, diffuse HK, grade 2 DD, PASP 56, resolved LV thrombus  SIRS -Patient presented with fever elevated lactic acid and respiratory failure -Suspect pulmonary source -viral respiratory panel neg -Blood cultures x2 sets -UA neg for pyuria -Check procalcitonin 0.29>>>0.55 -Lactic acid peaked 6.59 -Continue vancomycin and cefepime pending culture data  COPD exacerbation -Continue Pulmicort -Continue Xopenex and Atrovent -Continue IV Solu-Medrol  Acute metabolic encephalopathy -Multifactorial including medications, respiratory failure, infectious process -improved -TSH--1.736 -Serum B12--362  Elevated troponin -No chest pain presently -Secondary to demand ischemia -Trend is flat -Personally reviewed EKG--sinus rhythm, nonspecific T wave change -Echocardiogram--as above  LV thrombus -Initially noted on echo 09/16/2017 -09/17/2017 cardiac MRI--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  Atrial fibrillation, type unspecified -Presently has MAT -HR 100-120 -Continue warfarin -CHADSVASc = 5 (CHF, HTN, stroke, female)  Essential hypertension -Convert medications to IV until the patient is safe to tolerate po -restart coreg  CKD stage III -Baseline creatinine 1.1-1.4 -Presenting creatinine 1.43  Lactic acidosis -Multifactorial including infection as well as hypoxia -Review of medical record shows that the patient has had chronically elevated lactic acid    Disposition Plan:   Home in 2-3 days  Family Communication:  No Family at bedside  Consultants:  none  Code Status:  FULL   DVT Prophylaxis:  coumadin   Procedures: As Listed in Progress Note Above  Antibiotics: vanco 6/7>>> Cefepime 6/8>>> Zosyn 6/7  The patient is critically ill with multiple organ systems failure and requires high  complexity decision making for assessment and  support, frequent evaluation and titration of therapies, application of advanced monitoring technologies and extensive interpretation of multiple databases.  Critical care time - 35 mins.       Subjective: Patient remains short of breath but she denies any chest pain patient denies any nausea, vomiting, diarrhea, abdominal pain, headache, neck pain.  There is no dysuria hematuria.  Denies any hematochezia or melena.  Objective: Vitals:   12/02/17 0600 12/02/17 0700 12/02/17 0742 12/02/17 0800  BP: (!) 162/101 138/70  (!) 152/103  Pulse: (!) 124 90  71  Resp: (!) 23 19  (!) 31  Temp: 98.1 F (36.7 C) 97.9 F (36.6 C)  97.7 F (36.5 C)  TempSrc:      SpO2: 100% 99% 100% 96%  Weight:      Height:        Intake/Output Summary (Last 24 hours) at 12/02/2017 0916 Last data filed at 12/02/2017 0500 Gross per 24 hour  Intake 780 ml  Output 3075 ml  Net -2295 ml   Weight change: -7.4 kg (-16 lb 5 oz) Exam:   General:  Pt is alert, follows commands appropriately, not in acute distress  HEENT: No icterus, No thrush, No neck mass, [place]/AT  Cardiovascular: irregular, S1/S2, no rubs, no gallops  Respiratory: Bibasilar crackles.  Bibasilar expiratory wheeze.  Good air movement.  Abdomen: Soft/+BS, non tender, non distended, no guarding  Extremities: 1+ LEedema, No lymphangitis, No petechiae, No rashes, no synovitis   Data Reviewed: I have personally reviewed following labs and imaging studies Basic Metabolic Panel: Recent Labs  Lab 12/01/17 0005 12/01/17 0253 12/01/17 0758 12/02/17 0502  NA 138  --  135 137  K 4.0  --  3.6 3.5  CL 104  --  104 105  CO2 19*  --  20* 21*  GLUCOSE 187*  --  148* 161*  BUN 13  --  14 21*  CREATININE 1.43*  --  1.29* 1.24*  CALCIUM 9.4  --  8.5* 8.8*  MG  --  1.7 2.1 1.9  PHOS  --  4.3  --   --    Liver Function Tests: Recent Labs  Lab 12/01/17 0005  AST 17  ALT 14  ALKPHOS 81  BILITOT 1.8*  PROT 7.6  ALBUMIN 3.4*   No results  for input(s): LIPASE, AMYLASE in the last 168 hours. No results for input(s): AMMONIA in the last 168 hours. Coagulation Profile: Recent Labs  Lab 12/01/17 0253 12/02/17 0502  INR 3.96 2.77   CBC: Recent Labs  Lab 12/01/17 0005 12/02/17 0502  WBC 11.1* 8.3  NEUTROABS 7.4 7.0  HGB 11.0* 9.4*  HCT 39.9 32.8*  MCV 78.5 76.5*  PLT 518* 297   Cardiac Enzymes: Recent Labs  Lab 12/01/17 0005 12/01/17 0254 12/01/17 0758 12/01/17 1445  TROPONINI 0.09* 0.10* 0.15* 0.11*   BNP: Invalid input(s): POCBNP CBG: No results for input(s): GLUCAP in the last 168 hours. HbA1C: No results for input(s): HGBA1C in the last 72 hours. Urine analysis:    Component Value Date/Time   COLORURINE STRAW (A) 12/01/2017 1230   APPEARANCEUR CLEAR 12/01/2017 1230   LABSPEC 1.004 (L) 12/01/2017 1230   PHURINE 5.0 12/01/2017 1230   GLUCOSEU NEGATIVE 12/01/2017 1230   HGBUR NEGATIVE 12/01/2017 1230   BILIRUBINUR NEGATIVE 12/01/2017 1230   KETONESUR NEGATIVE 12/01/2017 1230   PROTEINUR NEGATIVE 12/01/2017 1230   NITRITE NEGATIVE 12/01/2017 1230   LEUKOCYTESUR NEGATIVE 12/01/2017 1230   Sepsis  Labs: @LABRCNTIP (procalcitonin:4,lacticidven:4) ) Recent Results (from the past 240 hour(s))  Blood Culture (routine x 2)     Status: None (Preliminary result)   Collection Time: 11/30/17 11:50 PM  Result Value Ref Range Status   Specimen Description LEFT ANTECUBITAL  Final   Special Requests   Final    BOTTLES DRAWN AEROBIC AND ANAEROBIC Blood Culture adequate volume   Culture   Final    NO GROWTH < 24 HOURS Performed at [facility], [address] [place], [place] [address]    Report Status PENDING  Incomplete  Blood Culture (routine x 2)     Status: None (Preliminary result)   Collection Time: 12/01/17 12:03 AM  Result Value Ref Range Status   Specimen Description BLOOD RIGHT FOREARM  Final   Special Requests   Final    BOTTLES DRAWN AEROBIC AND ANAEROBIC Blood Culture adequate volume    Culture   Final    NO GROWTH < 24 HOURS Performed at [facility], [address] [place], [place] [address]    Report Status PENDING  Incomplete  MRSA PCR Screening     Status: Abnormal   Collection Time: 12/01/17  4:04 AM  Result Value Ref Range Status   MRSA by PCR POSITIVE (A) NEGATIVE Final    Comment: RESULT CALLED TO, READ BACK BY AND VERIFIED WITH: [email or web address] @ 1600 ON 6.8.19 BY L.BOWMAN        The GeneXpert MRSA Assay (FDA approved for NASAL specimens only), is one component of a comprehensive MRSA colonization surveillance program. It is not intended to diagnose MRSA infection nor to guide or monitor treatment for MRSA infections. Performed at [facility], [address], [place], [place] [address]   Respiratory Panel by PCR     Status: None   Collection Time: 12/01/17 12:30 PM  Result Value Ref Range Status   Adenovirus NOT DETECTED NOT DETECTED Final   Coronavirus 229E NOT DETECTED NOT DETECTED Final   Coronavirus HKU1 NOT DETECTED NOT DETECTED Final   Coronavirus NL63 NOT DETECTED NOT DETECTED Final   Coronavirus OC43 NOT DETECTED NOT DETECTED Final   Metapneumovirus NOT DETECTED NOT DETECTED Final   Rhinovirus / Enterovirus NOT DETECTED NOT DETECTED Final   Influenza A NOT DETECTED NOT DETECTED Final   Influenza B NOT DETECTED NOT DETECTED Final   Parainfluenza Virus 1 NOT DETECTED NOT DETECTED Final   Parainfluenza Virus 2 NOT DETECTED NOT DETECTED Final   Parainfluenza Virus 3 NOT DETECTED NOT DETECTED Final   Parainfluenza Virus 4 NOT DETECTED NOT DETECTED Final   Respiratory Syncytial Virus NOT DETECTED NOT DETECTED Final   Bordetella pertussis NOT DETECTED NOT DETECTED Final   Chlamydophila pneumoniae NOT DETECTED NOT DETECTED Final   Mycoplasma pneumoniae NOT DETECTED NOT DETECTED Final    Comment: Performed at [facility], [place] [address], [place],  [address]     Scheduled Meds: . budesonide (PULMICORT) nebulizer solution  0.25 mg  Nebulization BID  . carvedilol  12.5 mg Oral BID WC  . chlorhexidine  15 mL Mouth Rinse BID  . furosemide  40 mg Intravenous BID  . ipratropium  0.5 mg Nebulization Q6H  . levalbuterol  1.25 mg Nebulization Q6H  . mouth rinse  15 mL Mouth Rinse q12n4p  . methylPREDNISolone (SOLU-MEDROL) injection  60 mg Intravenous Q8H  . pantoprazole  40 mg Oral Q1200  . warfarin  2.5 mg Oral Once  . Warfarin - Pharmacist Dosing Inpatient   Does not apply  q1800   Continuous Infusions: . ceFEPime (MAXIPIME) IV Stopped (12/01/17 1757)  . vancomycin Stopped (12/01/17 2053)    Procedures/Studies: Dg Chest Port 1 View  Result Date: 12/01/2017 CLINICAL DATA:  Sepsis EXAM: PORTABLE CHEST 1 VIEW COMPARISON:  December 01, 2017 FINDINGS: There is cardiomegaly with pulmonary venous hypertension. There is mild interstitial edema. There are pleural effusions bilaterally. There is bibasilar atelectasis. There is no frank airspace consolidation. There is aortic atherosclerosis. No evident adenopathy. No bone lesions. IMPRESSION: Pulmonary vascular congestion with pleural effusions and interstitial edema. The appearance is felt to represent a degree of congestive heart failure. There is bibasilar atelectasis. There is no frank consolidation. Electronically Signed   By: Lowella Grip III M.D.   On: 12/01/2017 07:15   Dg Chest Port 1 View  Result Date: 12/01/2017 CLINICAL DATA:  Code sepsis with nonproductive cough and shortness-of-breath as well as anxiety today. EXAM: PORTABLE CHEST 1 VIEW COMPARISON:  11/01/2017 FINDINGS: Lungs are hypoinflated with hazy prominence of the perihilar markings with mild bibasilar opacification. Findings likely due to mild vascular congestion with small bilateral pleural effusions. Vascular congestion unchanged as effusion slightly improved. Cardiomediastinal silhouette and remainder of the exam is unchanged. IMPRESSION: Findings suggesting mild vascular congestion without significant change and  small bilateral pleural effusions slightly improved. Electronically Signed   By: Marin Olp M.D.   On: 12/01/2017 00:50    Orson Eva, DO  [facility] Pager [phone]  If 7PM-7AM, please contact night-coverage [email or web address] Password [email or web address] 12/02/2017, 9:16 AM   LOS: 1 day

## 2017-12-03 LAB — BASIC METABOLIC PANEL
Anion gap: 11 (ref 5–15)
BUN: 24 mg/dL — ABNORMAL HIGH (ref 6–20)
CHLORIDE: 103 mmol/L (ref 101–111)
CO2: 26 mmol/L (ref 22–32)
CREATININE: 1.23 mg/dL — AB (ref 0.44–1.00)
Calcium: 9 mg/dL (ref 8.9–10.3)
GFR calc non Af Amer: 46 mL/min — ABNORMAL LOW (ref >60)
GFR, EST AFRICAN AMERICAN: 53 mL/min — AB (ref >60)
Glucose, Bld: 164 mg/dL — ABNORMAL HIGH (ref 65–99)
POTASSIUM: 3.6 mmol/L (ref 3.5–5.1)
Sodium: 140 mmol/L (ref 135–145)

## 2017-12-03 LAB — PROCALCITONIN: Procalcitonin: 0.36 ng/mL

## 2017-12-03 LAB — MAGNESIUM: MAGNESIUM: 2 mg/dL (ref 1.7–2.4)

## 2017-12-03 LAB — PROTIME-INR
INR: 2.9
Prothrombin Time: 30.1 seconds — ABNORMAL HIGH (ref 11.4–15.2)

## 2017-12-03 MED ORDER — WARFARIN SODIUM 2 MG PO TABS
2.0000 mg | ORAL_TABLET | Freq: Once | ORAL | Status: AC
  Administered 2017-12-03: 2 mg via ORAL
  Filled 2017-12-03: qty 1

## 2017-12-03 MED ORDER — LEVALBUTEROL HCL 1.25 MG/0.5ML IN NEBU
1.2500 mg | INHALATION_SOLUTION | Freq: Three times a day (TID) | RESPIRATORY_TRACT | Status: DC
  Administered 2017-12-04 – 2017-12-05 (×4): 1.25 mg via RESPIRATORY_TRACT
  Filled 2017-12-03 (×4): qty 0.5

## 2017-12-03 MED ORDER — PREDNISONE 20 MG PO TABS
60.0000 mg | ORAL_TABLET | Freq: Every day | ORAL | Status: DC
  Administered 2017-12-04: 60 mg via ORAL
  Filled 2017-12-03: qty 3

## 2017-12-03 MED ORDER — CHLORHEXIDINE GLUCONATE CLOTH 2 % EX PADS
6.0000 | MEDICATED_PAD | Freq: Every day | CUTANEOUS | Status: DC
  Administered 2017-12-03 – 2017-12-05 (×3): 6 via TOPICAL

## 2017-12-03 MED ORDER — PREDNISONE 20 MG PO TABS
40.0000 mg | ORAL_TABLET | Freq: Once | ORAL | Status: AC
  Administered 2017-12-03: 40 mg via ORAL
  Filled 2017-12-03: qty 2

## 2017-12-03 MED ORDER — MUPIROCIN 2 % EX OINT
1.0000 "application " | TOPICAL_OINTMENT | Freq: Two times a day (BID) | CUTANEOUS | Status: DC
  Administered 2017-12-03 – 2017-12-04 (×4): 1 via NASAL
  Filled 2017-12-03: qty 22

## 2017-12-03 MED ORDER — IPRATROPIUM BROMIDE 0.02 % IN SOLN
0.5000 mg | Freq: Three times a day (TID) | RESPIRATORY_TRACT | Status: DC
  Administered 2017-12-04 – 2017-12-05 (×4): 0.5 mg via RESPIRATORY_TRACT
  Filled 2017-12-03 (×4): qty 2.5

## 2017-12-03 NOTE — Progress Notes (Signed)
[facility]  Patient Status: Active (receiving services up to time of hospitalization)  [facility] is providing the following services: RN, PT and HHA  If patient discharges after hours, please call [phone].   [place] 12/03/2017, 1:04 PM

## 2017-12-03 NOTE — Progress Notes (Signed)
ANTICOAGULATION CONSULT NOTE - follow up [facility] for Coumadin Indication: atrial fibrillation  Allergies  Allergen Reactions  . Codeine Anaphylaxis  . Tramadol Anaphylaxis    No hydrocodone or anything connected No hydrocodone or anything connected   . Clarithromycin Nausea And Vomiting  . Citalopram Other (See Comments)    Caused more depression   . Fish Allergy Itching    ears  . Other     Patient says that all narcotics give her anaphylaxis.   . Wellbutrin [Bupropion] Other (See Comments)    "could not speak a full sentence for a whole year"    Patient Measurements: Height: 5\' 4"  (162.6 cm) Weight: 132 lb 15 oz (60.3 kg) IBW/kg (Calculated) : 54.7  Vital Signs: Temp: 97.9 F (36.6 C) (06/10 0500) Temp Source: Oral (06/10 0434) BP: 141/128 (06/10 0819) Pulse Rate: 94 (06/10 0819)  Labs: Recent Labs    12/01/17 0005 12/01/17 0253 12/01/17 0254 12/01/17 0758 12/01/17 1445 12/02/17 0502 12/03/17 0453  HGB 11.0*  --   --   --   --  9.4*  --   HCT 39.9  --   --   --   --  32.8*  --   PLT 518*  --   --   --   --  297  --   LABPROT  --  38.4*  --   --   --  29.0* 30.1*  INR  --  3.96  --   --   --  2.77 2.90  CREATININE 1.43*  --   --  1.29*  --  1.24* 1.23*  TROPONINI 0.09*  --  0.10* 0.15* 0.11*  --   --     Estimated Creatinine Clearance: 40.4 mL/min (A) (by C-G formula based on SCr of 1.23 mg/dL (H)).   Medical History: Past Medical History:  Diagnosis Date  . Atrial fibrillation ([place])   . CHF (congestive heart failure) ([place])   . COPD (chronic obstructive pulmonary disease) ([place])   . Hepatitis C   . Pulmonary thrombosis ([place])   . Renal artery stenosis ([place])   . Secondary hypertension   . Stroke ([place])   . Tobacco abuse     Medications:  Medications Prior to Admission  Medication Sig Dispense Refill Last Dose  . albuterol (PROVENTIL HFA;VENTOLIN HFA) 108 (90 Base) MCG/ACT inhaler Inhale 2 puffs into the lungs every 4 (four)  hours as needed.   Past Week at Unknown time  . albuterol (PROVENTIL) (2.5 MG/3ML) 0.083% nebulizer solution Take 3 mLs (2.5 mg total) by nebulization every 6 (six) hours as needed for wheezing. 75 mL 12   . carvedilol (COREG) 12.5 MG tablet Take 1 tablet (12.5 mg total) by mouth 2 (two) times daily with a meal.   10/30/2017 at 2000  . Fluticasone-Umeclidin-Vilant (TRELEGY ELLIPTA) 100-62.5-25 MCG/INH AEPB Inhale 1 puff into the lungs daily.    10/30/2017 at Unknown time  . furosemide (LASIX) 40 MG tablet Take 1 tablet (40 mg total) by mouth 2 (two) times daily. 60 tablet 0   . guaiFENesin (MUCINEX) 600 MG 12 hr tablet Take 1 tablet (600 mg total) by mouth 2 (two) times daily. 60 tablet 2   . hydrALAZINE (APRESOLINE) 25 MG tablet Take 1 tablet (25 mg total) by mouth 3 (three) times daily. 90 tablet 0   . LORazepam (ATIVAN) 0.5 MG tablet Take 0.5 mg by mouth every 8 (eight) hours.   10/31/2017 at Unknown time  . nitroGLYCERIN (NITROSTAT)  0.4 MG SL tablet Place 0.4 mg under the tongue every 5 (five) minutes as needed for chest pain.   unknown  . pantoprazole (PROTONIX) 40 MG tablet Take 1 tablet (40 mg total) by mouth daily at 12 noon.   10/30/2017 at Unknown time  . potassium chloride SA (K-DUR,KLOR-CON) 20 MEQ tablet Take 2 tablets (40 mEq total) by mouth daily. (Patient taking differently: Take 20 mEq by mouth daily. ) 60 tablet 0 10/30/2017 at Unknown time  . predniSONE (DELTASONE) 10 MG tablet Take 40mg  po daily for 2 days then 30mg  daily for 2 days then 20mg  daily for 2 days then 10mg  daily for 2 days then stop 20 tablet 0   . warfarin (COUMADIN) 5 MG tablet Take 1 tablet (5 mg total) by mouth daily. (Patient taking differently: Take 2.5 mg by mouth daily. ) 10 tablet 0 10/30/2017 at 8-1000a    Assessment: 62 year old female with history of COPD, diastolic CHF, atrial fibrillation, hep C, LV thrombus, stroke, continued tobacco abuse, hypertension, and anxiety presenting with shortness of breath and  nonproductive cough.  The patient was recently discharged from the hospital after a stay from 10/31/2017 through 11/05/2017 when she was treated for COPD exacerbation and sepsis secondary to pneumonia.  Pharmacy asked to dose Coumadin. INR is therapeutic, but on upper end of range. Home dose is 2.5mg  daily.  Goal of Therapy:  INR 2-3 Monitor platelets by anticoagulation protocol: Yes   Plan:  Coumadin 2mg  today Daily PT-INR Monitor for S/S of bleeding  Isac Sarna, BS [place], BCPS Clinical Pharmacist Pager [phone] 12/03/2017,8:43 AM

## 2017-12-03 NOTE — Progress Notes (Signed)
PROGRESS NOTE  Glenda Johnson WCH:[id number] DOB: May 27, 1956 DOA: 11/30/2017 PCP: [name], MD  Brief History: 62 year old female with history of COPD, diastolic CHF, atrial fibrillation, hep C, LV thrombus, stroke, continued tobacco abuse, hypertension, and anxiety presenting with shortness of breath and nonproductive cough for the past 1 to 2 weeks.. The patient also complained of fever up to 100.9 F at home. There were no reports of vomiting, diarrhea, abdominal pain. The patient was recently discharged from the hospital after a stay from 10/31/2017 through 11/05/2017 when she was treated for COPD exacerbation and sepsis secondary to pneumonia. Patient was discharged home with 2 L nasal cannula. However, she does not use it because she states it"knocks out the power in her house".  The patient endorses compliance with her medications, but endorses indiscretion with fluid intake.  She continues to smoke 1 to 2 cigarettes/day with a previous history of 80-pack-year history of tobacco.When the patient was evaluated by Dr. Olevia Bowens, she was somnolent and hypoxic. The patient was placed on BiPAP and admitted to the stepdown unit. At the time of admission, the patient was noted to have a fever 101.7 F with lactic acid of 6.59. The patient was started on Solu-Medrol and IV fluids initially. Initial troponin was 0.10. W BC was 11.1. She was started on vancomycin and cefepime.   Assessment/Plan: Acute on chronic respiratory failure with hypoxia -Secondary to decompensated CHF and COPD exacerbation -Presently on BiPAP>>>6L -Wean back to baseline 2 L -Pulmonary hygiene  Acute on chronic diastolic CHF -Continue furosemide 40 mg IV twice daily -Daily weights--question accuracy--NEG 17 lbs in 1 day? -NEG 5.3L in last 48 hours -Fluid restrict -continue coreg -09/16/2017 echo EF 55-60%, grade 2 DD, no WMA, mild TR, trivial pericardial effusion -Personally reviewed chest  x-ray--bilateral pleural effusions, right greater than left. Increased vascular congestion -12/01/2017 echo EF 50-55%, diffuse HK, grade 2 DD, PASP 56, resolved LV thrombus  SIRS -Patient presented with fever elevated lactic acid and respiratory failure -viral respiratory panel neg -Blood cultures x2 sets--neg -UA neg for pyuria -Check procalcitonin 0.29>>>0.55 -Lactic acid peaked 6.59 -disContinue vancomycin and cefepime  COPD exacerbation -Continue Pulmicort -Continue Xopenex and Atrovent -Continue IV Solu-Medrol  Acute metabolic encephalopathy -Multifactorial including medications, respiratory failure, infectious process -more somnolent today -discontinue ambien, ativan -haldol prn agitation -TSH--1.736 -Serum B12--362  Elevated troponin -No chest pain presently -Secondary to demand ischemia -Trend is flat -Personally reviewed EKG--sinus rhythm, nonspecific T wave change -Echocardiogram--as above  LV thrombus -Initially noted on echo 09/16/2017 -09/17/2017 cardiac MRI--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  Atrial fibrillation, type unspecified -Presently has MAT -HR 100-110 -Continue warfarin -CHADSVASc = 5 (CHF, HTN, stroke, female)  Essential hypertension -restart coreg  CKD stage III -Baseline creatinine 1.1-1.4 -Presenting creatinine 1.43  Lactic acidosis -Multifactorial including infection as well as hypoxia -Review of medical record shows that the patient has had chronically elevated lactic acid    Disposition Plan: Home in 2-3days  Family Communication:NoFamily at bedside  Consultants:none  Code Status: FULL   DVT Prophylaxis:coumadin   Procedures: As Listed in Progress Note Above  Antibiotics: vanco 6/7>>>6/10 Cefepime 6/8>>> Zosyn 6/7     Subjective: Patient is somnolent but wakes up and answers questions appropriately.  She denies any chest pain, shortness breath, abdominal pain, headache, neck pain.   There is no abdominal pain, dysuria, hematuria.  Objective: Vitals:   12/03/17 0818 12/03/17 0819 12/03/17 0823 12/03/17 1230  BP:  [name])  141/128    Pulse:  94    Resp:      Temp:    (!) 97.4 F (36.3 C)  TempSrc:    Axillary  SpO2: 98%  98%   Weight:      Height:        Intake/Output Summary (Last 24 hours) at 12/03/2017 1444 Last data filed at 12/02/2017 2259 Gross per 24 hour  Intake 660 ml  Output 3100 ml  Net -2440 ml   Weight change:  Exam:   General:  Pt is alert, follows commands appropriately, not in acute distress  HEENT: No icterus, No thrush, No neck mass, [place]/AT  Cardiovascular: RRR, S1/S2, no rubs, no gallops  Respiratory: Scattered bilateral rales.  No wheezing.  Good air movement.  Abdomen: Soft/+BS, non tender, non distended, no guarding  Extremities: 1+LE edema, No lymphangitis, No petechiae, No rashes, no synovitis   Data Reviewed: I have personally reviewed following labs and imaging studies Basic Metabolic Panel: Recent Labs  Lab 12/01/17 0005 12/01/17 0253 12/01/17 0758 12/02/17 0502 12/03/17 0453  NA 138  --  135 137 140  K 4.0  --  3.6 3.5 3.6  CL 104  --  104 105 103  CO2 19*  --  20* 21* 26  GLUCOSE 187*  --  148* 161* 164*  BUN 13  --  14 21* 24*  CREATININE 1.43*  --  1.29* 1.24* 1.23*  CALCIUM 9.4  --  8.5* 8.8* 9.0  MG  --  1.7 2.1 1.9 2.0  PHOS  --  4.3  --   --   --    Liver Function Tests: Recent Labs  Lab 12/01/17 0005  AST 17  ALT 14  ALKPHOS 81  BILITOT 1.8*  PROT 7.6  ALBUMIN 3.4*   No results for input(s): LIPASE, AMYLASE in the last 168 hours. No results for input(s): AMMONIA in the last 168 hours. Coagulation Profile: Recent Labs  Lab 12/01/17 0253 12/02/17 0502 12/03/17 0453  INR 3.96 2.77 2.90   CBC: Recent Labs  Lab 12/01/17 0005 12/02/17 0502  WBC 11.1* 8.3  NEUTROABS 7.4 7.0  HGB 11.0* 9.4*  HCT 39.9 32.8*  MCV 78.5 76.5*  PLT 518* 297   Cardiac Enzymes: Recent Labs  Lab  12/01/17 0005 12/01/17 0254 12/01/17 0758 12/01/17 1445  TROPONINI 0.09* 0.10* 0.15* 0.11*   BNP: Invalid input(s): POCBNP CBG: No results for input(s): GLUCAP in the last 168 hours. HbA1C: No results for input(s): HGBA1C in the last 72 hours. Urine analysis:    Component Value Date/Time   COLORURINE STRAW (A) 12/01/2017 1230   APPEARANCEUR CLEAR 12/01/2017 1230   LABSPEC 1.004 (L) 12/01/2017 1230   PHURINE 5.0 12/01/2017 1230   GLUCOSEU NEGATIVE 12/01/2017 1230   HGBUR NEGATIVE 12/01/2017 [place] 12/01/2017 1230   KETONESUR NEGATIVE 12/01/2017 1230   PROTEINUR NEGATIVE 12/01/2017 1230   NITRITE NEGATIVE 12/01/2017 1230   LEUKOCYTESUR NEGATIVE 12/01/2017 1230   Sepsis Labs: @LABRCNTIP (procalcitonin:4,lacticidven:4) ) Recent Results (from the past 240 hour(s))  Blood Culture (routine x 2)     Status: None (Preliminary result)   Collection Time: 11/30/17 11:50 PM  Result Value Ref Range Status   Specimen Description LEFT ANTECUBITAL  Final   Special Requests   Final    BOTTLES DRAWN AEROBIC AND ANAEROBIC Blood Culture adequate volume   Culture   Final    NO GROWTH 2 DAYS Performed at [facility], [address], [place], [place]  [address]    Report Status PENDING  Incomplete  Blood Culture (routine x 2)     Status: None (Preliminary result)   Collection Time: 12/01/17 12:03 AM  Result Value Ref Range Status   Specimen Description BLOOD RIGHT FOREARM  Final   Special Requests   Final    BOTTLES DRAWN AEROBIC AND ANAEROBIC Blood Culture adequate volume   Culture   Final    NO GROWTH 2 DAYS Performed at [facility], [address] [place], [place] [address]    Report Status PENDING  Incomplete  Urine culture     Status: None   Collection Time: 12/01/17 12:06 AM  Result Value Ref Range Status   Specimen Description   Final    URINE, CLEAN CATCH Performed at [facility], [address], [place], [place] [address]    Special Requests   Final     NONE Performed at [facility], [address] [place], [place] [address]    Culture   Final    NO GROWTH Performed at [facility], [place] [address] [place], [place] [address]    Report Status 12/02/2017 FINAL  Final  MRSA PCR Screening     Status: Abnormal   Collection Time: 12/01/17  4:04 AM  Result Value Ref Range Status   MRSA by PCR POSITIVE (A) NEGATIVE Final    Comment: RESULT CALLED TO, READ BACK BY AND VERIFIED WITH: [email or web address] @ 1600 ON 6.8.19 BY L.BOWMAN        The GeneXpert MRSA Assay (FDA approved for NASAL specimens only), is one component of a comprehensive MRSA colonization surveillance program. It is not intended to diagnose MRSA infection nor to guide or monitor treatment for MRSA infections. Performed at [facility], [address], [place], [place] [address]   Respiratory Panel by PCR     Status: None   Collection Time: 12/01/17 12:30 PM  Result Value Ref Range Status   Adenovirus NOT DETECTED NOT DETECTED Final   Coronavirus 229E NOT DETECTED NOT DETECTED Final   Coronavirus HKU1 NOT DETECTED NOT DETECTED Final   Coronavirus NL63 NOT DETECTED NOT DETECTED Final   Coronavirus OC43 NOT DETECTED NOT DETECTED Final   Metapneumovirus NOT DETECTED NOT DETECTED Final   Rhinovirus / Enterovirus NOT DETECTED NOT DETECTED Final   Influenza A NOT DETECTED NOT DETECTED Final   Influenza B NOT DETECTED NOT DETECTED Final   Parainfluenza Virus 1 NOT DETECTED NOT DETECTED Final   Parainfluenza Virus 2 NOT DETECTED NOT DETECTED Final   Parainfluenza Virus 3 NOT DETECTED NOT DETECTED Final   Parainfluenza Virus 4 NOT DETECTED NOT DETECTED Final   Respiratory Syncytial Virus NOT DETECTED NOT DETECTED Final   Bordetella pertussis NOT DETECTED NOT DETECTED Final   Chlamydophila pneumoniae NOT DETECTED NOT DETECTED Final   Mycoplasma pneumoniae NOT DETECTED NOT DETECTED Final    Comment: Performed at [facility], [place] [address], [place], [place] [address]      Scheduled Meds: . budesonide (PULMICORT) nebulizer solution  0.25 mg Nebulization BID  . carvedilol  12.5 mg Oral BID WC  . chlorhexidine  15 mL Mouth Rinse BID  . Chlorhexidine Gluconate Cloth  6 each Topical Q0600  . furosemide  40 mg Intravenous BID  . ipratropium  0.5 mg Nebulization Q6H  . levalbuterol  1.25 mg Nebulization Q6H  . mouth rinse  15 mL Mouth Rinse q12n4p  . methylPREDNISolone (SOLU-MEDROL) injection  60 mg Intravenous Q8H  . mupirocin ointment  1 application Nasal BID  . pantoprazole  40 mg Oral Q1200  . potassium chloride  40 mEq Oral Daily  . warfarin  2 mg Oral Once  . Warfarin - Pharmacist Dosing Inpatient   Does not apply q1800   Continuous Infusions: . ceFEPime (MAXIPIME) IV Stopped (12/02/17 1841)  . vancomycin Stopped (12/02/17 2137)    Procedures/Studies: Dg Chest Port 1 View  Result Date: 12/01/2017 CLINICAL DATA:  Sepsis EXAM: PORTABLE CHEST 1 VIEW COMPARISON:  December 01, 2017 FINDINGS: There is cardiomegaly with pulmonary venous hypertension. There is mild interstitial edema. There are pleural effusions bilaterally. There is bibasilar atelectasis. There is no frank airspace consolidation. There is aortic atherosclerosis. No evident adenopathy. No bone lesions. IMPRESSION: Pulmonary vascular congestion with pleural effusions and interstitial edema. The appearance is felt to represent a degree of congestive heart failure. There is bibasilar atelectasis. There is no frank consolidation. Electronically Signed   By: Lowella Grip III M.D.   On: 12/01/2017 07:15   Dg Chest Port 1 View  Result Date: 12/01/2017 CLINICAL DATA:  Code sepsis with nonproductive cough and shortness-of-breath as well as anxiety today. EXAM: PORTABLE CHEST 1 VIEW COMPARISON:  11/01/2017 FINDINGS: Lungs are hypoinflated with hazy prominence of the perihilar markings with mild bibasilar opacification. Findings likely due to mild vascular congestion with small bilateral pleural  effusions. Vascular congestion unchanged as effusion slightly improved. Cardiomediastinal silhouette and remainder of the exam is unchanged. IMPRESSION: Findings suggesting mild vascular congestion without significant change and small bilateral pleural effusions slightly improved. Electronically Signed   By: Marin Olp M.D.   On: 12/01/2017 00:50    Orson Eva, DO  [facility] Pager [phone]  If 7PM-7AM, please contact night-coverage [email or web address] Password [email or web address] 12/03/2017, 2:44 PM   LOS: 2 days

## 2017-12-03 NOTE — Care Management Note (Signed)
Case Management Note  Patient Details  Name: Kalliopi Coupland MRN: [id number] Date of Birth: 1956-05-06  Subjective/Objective:     Acute respiratory failure. From home. Active with [facility] for RN, PT and aide. Has oxygen at home, reports she has not been wearing it.        Action/Plan: DC home with resumption of home health services.   Expected Discharge Date:     12/05/2017             Expected Discharge Plan:  [place]  In-House Referral:     Discharge planning Services  CM Consult  Post Acute Care Choice:  Home Health, Resumption of Svcs/PTA Provider Choice offered to:     DME Arranged:    DME Agency:     HH Arranged:    Early Agency:  [place]  Status of Service:  In process, will continue to follow  If discussed at Long Length of Stay Meetings, dates discussed:    Additional Comments:  Danylah Holden, Chauncey [place], RN 12/03/2017, 2:25 PM

## 2017-12-04 LAB — BASIC METABOLIC PANEL
Anion gap: 12 (ref 5–15)
BUN: 28 mg/dL — ABNORMAL HIGH (ref 6–20)
CHLORIDE: 104 mmol/L (ref 101–111)
CO2: 26 mmol/L (ref 22–32)
CREATININE: 1.13 mg/dL — AB (ref 0.44–1.00)
Calcium: 8.9 mg/dL (ref 8.9–10.3)
GFR, EST AFRICAN AMERICAN: 59 mL/min — AB (ref >60)
GFR, EST NON AFRICAN AMERICAN: 51 mL/min — AB (ref >60)
Glucose, Bld: 175 mg/dL — ABNORMAL HIGH (ref 65–99)
Potassium: 3.1 mmol/L — ABNORMAL LOW (ref 3.5–5.1)
SODIUM: 142 mmol/L (ref 135–145)

## 2017-12-04 LAB — PROTIME-INR
INR: 3.26
Prothrombin Time: 33 seconds — ABNORMAL HIGH (ref 11.4–15.2)

## 2017-12-04 LAB — MAGNESIUM: MAGNESIUM: 1.9 mg/dL (ref 1.7–2.4)

## 2017-12-04 MED ORDER — PREDNISONE 20 MG PO TABS
50.0000 mg | ORAL_TABLET | Freq: Every day | ORAL | Status: DC
Start: 2017-12-05 — End: 2017-12-05
  Filled 2017-12-04: qty 2

## 2017-12-04 MED ORDER — HYDRALAZINE HCL 20 MG/ML IJ SOLN
10.0000 mg | INTRAMUSCULAR | Status: DC | PRN
  Administered 2017-12-04: 10 mg via INTRAVENOUS
  Filled 2017-12-04: qty 1

## 2017-12-04 MED ORDER — POTASSIUM CHLORIDE CRYS ER 20 MEQ PO TBCR
40.0000 meq | EXTENDED_RELEASE_TABLET | Freq: Once | ORAL | Status: AC
  Administered 2017-12-04: 40 meq via ORAL
  Filled 2017-12-04: qty 2

## 2017-12-04 MED ORDER — FUROSEMIDE 40 MG PO TABS
40.0000 mg | ORAL_TABLET | Freq: Two times a day (BID) | ORAL | Status: DC
  Filled 2017-12-04: qty 1

## 2017-12-04 NOTE — Progress Notes (Signed)
ANTICOAGULATION CONSULT NOTE - follow up [facility] for Coumadin Indication: atrial fibrillation  Allergies  Allergen Reactions  . Codeine Anaphylaxis  . Tramadol Anaphylaxis    No hydrocodone or anything connected No hydrocodone or anything connected   . Clarithromycin Nausea And Vomiting  . Citalopram Other (See Comments)    Caused more depression   . Fish Allergy Itching    ears  . Other     Patient says that all narcotics give her anaphylaxis.   . Wellbutrin [Bupropion] Other (See Comments)    "could not speak a full sentence for a whole year"    Patient Measurements: Height: 5\' 4"  (162.6 cm) Weight: 132 lb 15 oz (60.3 kg) IBW/kg (Calculated) : 54.7  Vital Signs: Temp: 97.2 F (36.2 C) (06/11 0743) BP: 143/72 (06/11 0500) Pulse Rate: 83 (06/11 0500)  Labs: Recent Labs    12/01/17 1445 12/02/17 0502 12/03/17 0453 12/04/17 0503  HGB  --  9.4*  --   --   HCT  --  32.8*  --   --   PLT  --  297  --   --   LABPROT  --  29.0* 30.1* 33.0*  INR  --  2.77 2.90 3.26  CREATININE  --  1.24* 1.23* 1.13*  TROPONINI 0.11*  --   --   --    Estimated Creatinine Clearance: 44 mL/min (A) (by C-G formula based on SCr of 1.13 mg/dL (H)).  Assessment: Pharmacy asked to dose Coumadin. INR above goal.  No bleeding noted. Home dose is 2.5mg  daily.  Goal of Therapy:  INR 2-3 Monitor platelets by anticoagulation protocol: Yes   Plan:  No Warfarin today. Daily PT-INR Monitor for S/S of bleeding  Pricilla Larsson, [facility]  12/04/2017,11:40 AM

## 2017-12-04 NOTE — Plan of Care (Signed)
  Problem: Acute Rehab PT Goals(only PT should resolve) Goal: Pt Will Go Supine/Side To Sit Outcome: Progressing Flowsheets (Taken 12/04/2017 1352) Pt will go Supine/Side to Sit: with supervision Goal: Patient Will Transfer Sit To/From Stand Outcome: Progressing Flowsheets (Taken 12/04/2017 1352) Patient will transfer sit to/from stand: with supervision Goal: Pt Will Transfer Bed To Chair/Chair To Bed Outcome: Progressing Flowsheets (Taken 12/04/2017 1352) Pt will Transfer Bed to Chair/Chair to Bed: with supervision Goal: Pt Will Ambulate Outcome: Progressing Flowsheets (Taken 12/04/2017 1352) Pt will Ambulate: 50 feet;with supervision;with rolling walker   1:53 PM, 12/04/17 Lonell Grandchild, MPT Physical Therapist with [facility] [phone] office [phone] mobile phone

## 2017-12-04 NOTE — Evaluation (Signed)
Physical Therapy Evaluation Patient Details Name: Glenda Johnson MRN: [id number] DOB: 1956-03-28 Today's Date: 12/04/2017   History of Present Illness  Glenda Johnson is a 62 y.o. female with medical history significant of atrial fibrillation, chronic diastolic heart failure, COPD, hep C, history of pulmonary thrombosis, renal artery stenosis, secondary hypertension, history of CVA, tobacco use, history of cardiac mass who is coming to the emergency department with complaints of shortness of breath and anxiety.  She mentioned earlier to Dr. Laurann Montana that she has been coughing a lot but this is nonproductive.  When I arrived to the room, the patient was restless, mildly cyanotic with an O2 sat of 76% on of oxygen after she took her nasal cannula off. She had recently given lorazepam 1 mg IVP as well.  She is somnolent, mildly hypoxic, confused and unable to provide further history.    Clinical Impression  Patient demonstrates slow labored movement for sitting up at bedside due to generalized weakness, once on feet demonstrated fair balance using RW and able to ambulate out of room but limited due to c/o fatigue.  Patient tolerated sitting up in chair after therapy - RN notified.  Patient will benefit from continued physical therapy in hospital and recommended venue below to increase strength, balance, endurance for safe ADLs and gait.    Follow Up Recommendations Home health PT;Supervision - Intermittent    Equipment Recommendations  None recommended by PT    Recommendations for Other Services       Precautions / Restrictions Precautions Precautions: Fall Restrictions Weight Bearing Restrictions: No      Mobility  Bed Mobility Overal bed mobility: Needs Assistance Bed Mobility: Supine to Sit     Supine to sit: Min assist;Mod assist     General bed mobility comments: slow labored movement  Transfers Overall transfer level: Needs assistance Equipment used: Rolling walker (2  wheeled) Transfers: Sit to/from [facility] Sit to Stand: Min guard Stand pivot transfers: Min guard          Ambulation/Gait Ambulation/Gait assistance: Min guard Ambulation Distance (Feet): 25 Feet Assistive device: Rolling walker (2 wheeled) Gait Pattern/deviations: Decreased step length - right;Decreased step length - left;Decreased stride length Gait velocity: slow   General Gait Details: demonstrates slow labored movement  Stairs            Wheelchair Mobility    Modified Rankin (Stroke Patients Only)       Balance Overall balance assessment: Needs assistance Sitting-balance support: Feet supported;No upper extremity supported Sitting balance-Leahy Scale: Good     Standing balance support: Bilateral upper extremity supported;During functional activity Standing balance-Leahy Scale: Fair                               Pertinent Vitals/Pain Pain Assessment: No/denies pain    Home Living Family/patient expects to be discharged to:: Private residence Living Arrangements: Alone Available Help at Discharge: Family;Friend(s);Available PRN/intermittently Type of Home: House Home Access: Stairs to enter Entrance Stairs-Rails: Right;Left;Can reach both Entrance Stairs-Number of Steps: 2 Home Layout: One level Home Equipment: Walker - 4 wheels;Cane - single point;Shower seat;Bedside commode;Wheelchair - manual Additional Comments: information provided by patient    Prior Function Level of Independence: Independent         Comments: household ambulation without AD     Hand Dominance   Dominant Hand: Right    Extremity/Trunk Assessment   Upper Extremity Assessment Upper Extremity Assessment: Generalized weakness  Lower Extremity Assessment Lower Extremity Assessment: Generalized weakness    Cervical / Trunk Assessment Cervical / Trunk Assessment: Normal  Communication   Communication: No difficulties  Cognition  Arousal/Alertness: Awake/alert Behavior During Therapy: WFL for tasks assessed/performed Overall Cognitive Status: Within Functional Limits for tasks assessed                                        General Comments      Exercises     Assessment/Plan    PT Assessment Patient needs continued PT services  PT Problem List Decreased strength;Decreased activity tolerance;Decreased balance;Decreased mobility       PT Treatment Interventions Gait training;Stair training;Functional mobility training;Therapeutic activities;Therapeutic exercise;Patient/family education    PT Goals (Current goals can be found in the Care Plan section)  Acute Rehab PT Goals Patient Stated Goal: return home with family/friends to assist PT Goal Formulation: With patient Time For Goal Achievement: 12/08/17 Potential to Achieve Goals: Good    Frequency Min 3X/week   Barriers to discharge        Co-evaluation               AM-PAC PT "6 Clicks" Daily Activity  Outcome Measure Difficulty turning over in bed (including adjusting bedclothes, sheets and blankets)?: A Little Difficulty moving from lying on back to sitting on the side of the bed? : A Lot Difficulty sitting down on and standing up from a chair with arms (e.g., wheelchair, bedside commode, etc,.)?: A Little Help needed moving to and from a bed to chair (including a wheelchair)?: A Little Help needed walking in hospital room?: A Little Help needed climbing 3-5 steps with a railing? : A Lot 6 Click Score: 16    End of Session   Activity Tolerance: Patient tolerated treatment well;Patient limited by fatigue Patient left: in chair;with call bell/phone within reach Nurse Communication: Mobility status;Other (comment)(RN notified that patient left up in chair) PT Visit Diagnosis: Unsteadiness on feet (R26.81);Other abnormalities of gait and mobility (R26.89);Muscle weakness (generalized) (M62.81)    Time: 3785-8850 PT  Time Calculation (min) (ACUTE ONLY): 30 min   Charges:   PT Evaluation $PT Eval Moderate Complexity: 1 Mod PT Treatments $Therapeutic Activity: 23-37 mins   PT G Codes:        1:51 PM, 12-30-2017 Lonell Grandchild, MPT Physical Therapist with [facility] [phone] office [phone] mobile phone

## 2017-12-04 NOTE — Progress Notes (Signed)
PROGRESS NOTE  Glenda Johnson YSA:[id number] DOB: 1956-01-02 DOA: 11/30/2017 PCP: [name], MD  Brief History: 62 year old female with history of COPD, diastolic CHF, atrial fibrillation, hep C, LV thrombus, stroke, continued tobacco abuse, hypertension, and anxiety presenting with shortness of breath and nonproductive coughfor the past 1 to 2 weeks.. The patient also complained of fever up to 100.9 F at home. There were no reports of vomiting, diarrhea, abdominal pain. The patient was recently discharged from the hospital after a stay from 10/31/2017 through 11/05/2017 when she was treated for COPD exacerbation and sepsis secondary to pneumonia. Patient was discharged home with 2 L nasal cannula. However, she does not use it because she states it"knocks out the power in her house".The patient endorses compliance with her medications, but endorses indiscretion with fluid intake. She continues to smoke 1 to 2 cigarettes/day with a previous history of 80-pack-year history of tobacco.  The patient also had a recent hospitalization from 09/15/2017 through 09/24/2017 during which she had a cardioembolic stroke and was discharged on Coumadin. When the patient was evaluated by Dr. Olevia Bowens, she was somnolent and hypoxic. The patient was placed on BiPAP and admitted to the stepdown unit. At the time of admission, the patient was noted to have a fever 101.7 F with lactic acid of 6.59. The patient was started on Solu-Medrol and IV fluids initially. Initial troponin was 0.10. W BC was 11.1. She was started on vancomycin and cefepime.   Assessment/Plan: Acute on chronic respiratory failure with hypoxia -Secondary to decompensated CHF and COPD exacerbation -Presently on BiPAP>>>6L -Wean back to baseline 2 L -Pulmonary hygiene  Acute on chronic diastolic CHF -Continuefurosemide 40 mg IV twice daily>>>transition to po lasix 6/12 -Daily weights--question accuracy--NEG 17 lbs in 1  day? -NEG 6.9 L for the admission -Fluid restrict -continue coreg -09/16/2017 echo EF 55-60%, grade 2 DD, no WMA, mild TR, trivial pericardial effusion -Personally reviewed chest x-ray--bilateral pleural effusions, right greater than left. Increased vascular congestion -12/01/2017 echo EF 50-55%, diffuse HK, grade 2 DD, PASP 56, resolved LV thrombus  SIRS -Patient presented with fever elevated lactic acid and respiratory failure -viral respiratory panel neg -Blood cultures x2 sets--neg -UA neg for pyuria -Check procalcitonin0.29>>>0.55 -Lactic acid peaked 6.59 -disContinue vancomycin and cefepime on 6/9-->remained afebrile since then  COPD exacerbation -ContinuePulmicort -Continue Xopenex and Atrovent -Continue IV Solu-Medrol>>>prednisone taper  Acute metabolic encephalopathy/Hospital delirium -Multifactorial including medications, respiratory failure, infectious process -sister stated pt has had intermittent confusion since her cardioembolic stroke in late March 2019 -discontinue ambien, ativan -sister states pt is near baseline  -haldol prn agitation -TSH--1.736 -Serum B12--362  Elevated troponin -No chest pain presently -Secondary to demand ischemia -Trend is flat -Personally reviewed EKG--sinus rhythm, nonspecific T wave change -Echocardiogram--as above  LV thrombus -Initially noted on echo 09/16/2017 -09/17/2017 cardiac MRI--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  Atrial fibrillation, type unspecified-->cardioembolic stroke hx -intermittent SVT/MAT during hospitalization -HR now controlled -Continue warfarin -CHADSVASc = 5 (CHF, HTN, stroke, female)  Essential hypertension -restart coreg and hydralazine  CKD stage III -Baseline creatinine 1.1-1.4 -Presenting creatinine 1.43  Lactic acidosis -Multifactorial including infection as well as hypoxia -Review of medical record shows that the patient has  had chronically elevated lactic acid    Disposition Plan: Home 6/12 or 6/13 if stable Family Communication:sister updated on phone 6/11  Consultants:none  Code Status: FULL   DVT Prophylaxis:coumadin   Procedures: As Listed in  Progress Note Above  Antibiotics: vanco 6/7>>>6/9 Cefepime 6/8>>>6/9 Zosyn 6/7      Subjective: Patient denies any headache, chest pain or shortness breath, nausea vomiting, diarrhea, abdominal pain, dysuria, hematuria.  Objective: Vitals:   12/04/17 1000 12/04/17 1100 12/04/17 1200 12/04/17 1300  BP: (!) 145/73 (!) 140/96 (!) 138/55 128/73  Pulse: 82 (!) 46 (!) 118 74  Resp:  15 (!) 23 (!) 26  Temp: (!) 97 F (36.1 C) (!) 97 F (36.1 C) (!) 97.2 F (36.2 C) (!) 96.8 F (36 C)  TempSrc:      SpO2: 91% 94% 94% 97%  Weight:      Height:        Intake/Output Summary (Last 24 hours) at 12/04/2017 1401 Last data filed at 12/04/2017 1236 Gross per 24 hour  Intake 480 ml  Output 4450 ml  Net -3970 ml   Weight change:  Exam:   General:  Pt is alert, follows commands appropriately, not in acute distress  HEENT: No icterus, No thrush, No neck mass, [place]/AT  Cardiovascular: RRR, S1/S2, no rubs, no gallops  Respiratory: Bibasilar crackles but no wheezing.  Good air movement  Abdomen: Soft/+BS, non tender, non distended, no guarding  Extremities: 1 +LE edema, No lymphangitis, No petechiae, No rashes, no synovitis   Data Reviewed: I have personally reviewed following labs and imaging studies Basic Metabolic Panel: Recent Labs  Lab 12/01/17 0005 12/01/17 0253 12/01/17 0758 12/02/17 0502 12/03/17 0453 12/04/17 0503  NA 138  --  135 137 140 142  K 4.0  --  3.6 3.5 3.6 3.1*  CL 104  --  104 105 103 104  CO2 19*  --  20* 21* 26 26  GLUCOSE 187*  --  148* 161* 164* 175*  BUN 13  --  14 21* 24* 28*  CREATININE 1.43*  --  1.29* 1.24* 1.23* 1.13*  CALCIUM 9.4  --  8.5* 8.8* 9.0 8.9  MG  --  1.7 2.1 1.9 2.0 1.9    PHOS  --  4.3  --   --   --   --    Liver Function Tests: Recent Labs  Lab 12/01/17 0005  AST 17  ALT 14  ALKPHOS 81  BILITOT 1.8*  PROT 7.6  ALBUMIN 3.4*   No results for input(s): LIPASE, AMYLASE in the last 168 hours. No results for input(s): AMMONIA in the last 168 hours. Coagulation Profile: Recent Labs  Lab 12/01/17 0253 12/02/17 0502 12/03/17 0453 12/04/17 0503  INR 3.96 2.77 2.90 3.26   CBC: Recent Labs  Lab 12/01/17 0005 12/02/17 0502  WBC 11.1* 8.3  NEUTROABS 7.4 7.0  HGB 11.0* 9.4*  HCT 39.9 32.8*  MCV 78.5 76.5*  PLT 518* 297   Cardiac Enzymes: Recent Labs  Lab 12/01/17 0005 12/01/17 0254 12/01/17 0758 12/01/17 1445  TROPONINI 0.09* 0.10* 0.15* 0.11*   BNP: Invalid input(s): POCBNP CBG: No results for input(s): GLUCAP in the last 168 hours. HbA1C: No results for input(s): HGBA1C in the last 72 hours. Urine analysis:    Component Value Date/Time   COLORURINE STRAW (A) 12/01/2017 1230   APPEARANCEUR CLEAR 12/01/2017 1230   LABSPEC 1.004 (L) 12/01/2017 1230   PHURINE 5.0 12/01/2017 1230   GLUCOSEU NEGATIVE 12/01/2017 1230   HGBUR NEGATIVE 12/01/2017 1230   BILIRUBINUR NEGATIVE 12/01/2017 1230   KETONESUR NEGATIVE 12/01/2017 1230   PROTEINUR NEGATIVE 12/01/2017 1230   NITRITE NEGATIVE 12/01/2017 1230   LEUKOCYTESUR NEGATIVE 12/01/2017 1230   Sepsis Labs: @  LABRCNTIP(procalcitonin:4,lacticidven:4) ) Recent Results (from the past 240 hour(s))  Blood Culture (routine x 2)     Status: None (Preliminary result)   Collection Time: 11/30/17 11:50 PM  Result Value Ref Range Status   Specimen Description LEFT ANTECUBITAL  Final   Special Requests   Final    BOTTLES DRAWN AEROBIC AND ANAEROBIC Blood Culture adequate volume   Culture   Final    NO GROWTH 3 DAYS Performed at [facility], [address] [place], [place] [address]    Report Status PENDING  Incomplete  Blood Culture (routine x 2)     Status: None (Preliminary result)    Collection Time: 12/01/17 12:03 AM  Result Value Ref Range Status   Specimen Description BLOOD RIGHT FOREARM  Final   Special Requests   Final    BOTTLES DRAWN AEROBIC AND ANAEROBIC Blood Culture adequate volume   Culture   Final    NO GROWTH 3 DAYS Performed at [facility], [address], [place], [place] [address]    Report Status PENDING  Incomplete  Urine culture     Status: None   Collection Time: 12/01/17 12:06 AM  Result Value Ref Range Status   Specimen Description   Final    URINE, CLEAN CATCH Performed at [facility], [address], [place], [place] [address]    Special Requests   Final    NONE Performed at [facility], [address], [place], [place] [address]    Culture   Final    NO GROWTH Performed at [facility], [place] [address], [place], [place] [address]    Report Status 12/02/2017 FINAL  Final  MRSA PCR Screening     Status: Abnormal   Collection Time: 12/01/17  4:04 AM  Result Value Ref Range Status   MRSA by PCR POSITIVE (A) NEGATIVE Final    Comment: RESULT CALLED TO, READ BACK BY AND VERIFIED WITH: [email or web address] @ 1600 ON 6.8.19 BY L.BOWMAN        The GeneXpert MRSA Assay (FDA approved for NASAL specimens only), is one component of a comprehensive MRSA colonization surveillance program. It is not intended to diagnose MRSA infection nor to guide or monitor treatment for MRSA infections. Performed at [facility], [address], [place], [place] [address]   Respiratory Panel by PCR     Status: None   Collection Time: 12/01/17 12:30 PM  Result Value Ref Range Status   Adenovirus NOT DETECTED NOT DETECTED Final   Coronavirus 229E NOT DETECTED NOT DETECTED Final   Coronavirus HKU1 NOT DETECTED NOT DETECTED Final   Coronavirus NL63 NOT DETECTED NOT DETECTED Final   Coronavirus OC43 NOT DETECTED NOT DETECTED Final   Metapneumovirus NOT DETECTED NOT DETECTED Final   Rhinovirus / Enterovirus NOT DETECTED NOT DETECTED Final   Influenza A NOT  DETECTED NOT DETECTED Final   Influenza B NOT DETECTED NOT DETECTED Final   Parainfluenza Virus 1 NOT DETECTED NOT DETECTED Final   Parainfluenza Virus 2 NOT DETECTED NOT DETECTED Final   Parainfluenza Virus 3 NOT DETECTED NOT DETECTED Final   Parainfluenza Virus 4 NOT DETECTED NOT DETECTED Final   Respiratory Syncytial Virus NOT DETECTED NOT DETECTED Final   Bordetella pertussis NOT DETECTED NOT DETECTED Final   Chlamydophila pneumoniae NOT DETECTED NOT DETECTED Final   Mycoplasma pneumoniae NOT DETECTED NOT DETECTED Final    Comment: Performed at [facility], [place] [address], [place], [place] [address]     Scheduled Meds: .  budesonide (PULMICORT) nebulizer solution  0.25 mg Nebulization BID  . carvedilol  12.5 mg Oral BID WC  . chlorhexidine  15 mL Mouth Rinse BID  . Chlorhexidine Gluconate Cloth  6 each Topical Q0600  . furosemide  40 mg Intravenous BID  . ipratropium  0.5 mg Nebulization TID  . levalbuterol  1.25 mg Nebulization TID  . mouth rinse  15 mL Mouth Rinse q12n4p  . mupirocin ointment  1 application Nasal BID  . pantoprazole  40 mg Oral Q1200  . potassium chloride  40 mEq Oral Daily  . predniSONE  60 mg Oral Q breakfast  . Warfarin - Pharmacist Dosing Inpatient   Does not apply q1800   Continuous Infusions:  Procedures/Studies: Dg Chest Port 1 View  Result Date: 12/01/2017 CLINICAL DATA:  Sepsis EXAM: PORTABLE CHEST 1 VIEW COMPARISON:  December 01, 2017 FINDINGS: There is cardiomegaly with pulmonary venous hypertension. There is mild interstitial edema. There are pleural effusions bilaterally. There is bibasilar atelectasis. There is no frank airspace consolidation. There is aortic atherosclerosis. No evident adenopathy. No bone lesions. IMPRESSION: Pulmonary vascular congestion with pleural effusions and interstitial edema. The appearance is felt to represent a degree of congestive heart failure. There is bibasilar atelectasis. There is no frank consolidation.  Electronically Signed   By: Lowella Grip III M.D.   On: 12/01/2017 07:15   Dg Chest Port 1 View  Result Date: 12/01/2017 CLINICAL DATA:  Code sepsis with nonproductive cough and shortness-of-breath as well as anxiety today. EXAM: PORTABLE CHEST 1 VIEW COMPARISON:  11/01/2017 FINDINGS: Lungs are hypoinflated with hazy prominence of the perihilar markings with mild bibasilar opacification. Findings likely due to mild vascular congestion with small bilateral pleural effusions. Vascular congestion unchanged as effusion slightly improved. Cardiomediastinal silhouette and remainder of the exam is unchanged. IMPRESSION: Findings suggesting mild vascular congestion without significant change and small bilateral pleural effusions slightly improved. Electronically Signed   By: Marin Olp M.D.   On: 12/01/2017 00:50    Orson Eva, DO  [facility] Pager [phone]  If 7PM-7AM, please contact night-coverage [email or web address] Password [email or web address] 12/04/2017, 2:01 PM   LOS: 3 days

## 2017-12-05 LAB — BASIC METABOLIC PANEL
Anion gap: 9 (ref 5–15)
BUN: 30 mg/dL — AB (ref 6–20)
CALCIUM: 9 mg/dL (ref 8.9–10.3)
CHLORIDE: 102 mmol/L (ref 101–111)
CO2: 29 mmol/L (ref 22–32)
CREATININE: 1.06 mg/dL — AB (ref 0.44–1.00)
GFR calc non Af Amer: 55 mL/min — ABNORMAL LOW (ref >60)
GLUCOSE: 152 mg/dL — AB (ref 65–99)
Potassium: 3.6 mmol/L (ref 3.5–5.1)
Sodium: 140 mmol/L (ref 135–145)

## 2017-12-05 LAB — PROTIME-INR
INR: 3.64
Prothrombin Time: 35.9 seconds — ABNORMAL HIGH (ref 11.4–15.2)

## 2017-12-05 MED ORDER — PREDNISONE 10 MG PO TABS: 10.0000 mg | ORAL_TABLET | Freq: Every day | ORAL | 0 refills | Status: DC

## 2017-12-05 NOTE — Progress Notes (Signed)
Physical Therapy Treatment Patient Details Name: Glenda Johnson MRN: [id number] DOB: April 17, 1956 Today's Date: 12/05/2017    History of Present Illness Glenda Johnson is a 62 y.o. female with medical history significant of atrial fibrillation, chronic diastolic heart failure, COPD, hep C, history of pulmonary thrombosis, renal artery stenosis, secondary hypertension, history of CVA, tobacco use, history of cardiac mass who is coming to the emergency department with complaints of shortness of breath and anxiety.  She mentioned earlier to Dr. Laurann Montana that she has been coughing a lot but this is nonproductive.  When I arrived to the room, the patient was restless, mildly cyanotic with an O2 sat of 76% on of oxygen after she took her nasal cannula off. She had recently given lorazepam 1 mg IVP as well.  She is somnolent, mildly hypoxic, confused and unable to provide further history.    PT Comments    Patient demonstrates improvement for using BUE for sitting up at bedside with less assistance, increased endurance/distance for ambulation in hallway without loss of balance while on room air with O2 saturation above 95% and tolerated sitting up in chair after therapy.  Patient will benefit from continued physical therapy in hospital and recommended venue below to increase strength, balance, endurance for safe ADLs and gait.    Follow Up Recommendations  Home health PT;Supervision - Intermittent     Equipment Recommendations  None recommended by PT    Recommendations for Other Services       Precautions / Restrictions Precautions Precautions: Fall Restrictions Weight Bearing Restrictions: No    Mobility  Bed Mobility Overal bed mobility: Needs Assistance Bed Mobility: Supine to Sit     Supine to sit: Supervision     General bed mobility comments: demonstrates improvement for propping up on elbows for supine to sitting  Transfers Overall transfer level: Needs assistance Equipment used:  Rolling walker (2 wheeled) Transfers: Sit to/from [facility] Sit to Stand: Supervision Stand pivot transfers: Supervision          Ambulation/Gait Ambulation/Gait assistance: Supervision Ambulation Distance (Feet): 80 Feet Assistive device: Rolling walker (2 wheeled) Gait Pattern/deviations: Decreased step length - right;Decreased step length - left;Decreased stride length Gait velocity: slow   General Gait Details: slightly labored slow cadence without loss of balance, increased endurance/distance while on room air with O2 sats between 94-99%   Stairs             Wheelchair Mobility    Modified Rankin (Stroke Patients Only)       Balance Overall balance assessment: Needs assistance Sitting-balance support: Feet supported;No upper extremity supported Sitting balance-Leahy Scale: Good     Standing balance support: Bilateral upper extremity supported;During functional activity Standing balance-Leahy Scale: Fair                              Cognition Arousal/Alertness: Awake/alert Behavior During Therapy: WFL for tasks assessed/performed Overall Cognitive Status: Within Functional Limits for tasks assessed                                        Exercises General Exercises - Lower Extremity Ankle Circles/Pumps: Seated;AROM;Strengthening;Both;10 reps Long Arc Quad: Seated;AROM;Strengthening;Both;10 reps Hip Flexion/Marching: Seated;AROM;Strengthening;Both;10 reps    General Comments        Pertinent Vitals/Pain Pain Assessment: No/denies pain    Home Living Family/patient expects to be  discharged to:: Private residence Living Arrangements: Alone                  Prior Function            PT Goals (current goals can now be found in the care plan section) Acute Rehab PT Goals Patient Stated Goal: return home with family/friends to assist PT Goal Formulation: With patient Time For Goal Achievement:  12/08/17 Potential to Achieve Goals: Good Progress towards PT goals: Progressing toward goals    Frequency    Min 3X/week      PT Plan Current plan remains appropriate    Co-evaluation              AM-PAC PT "6 Clicks" Daily Activity  Outcome Measure  Difficulty turning over in bed (including adjusting bedclothes, sheets and blankets)?: None Difficulty moving from lying on back to sitting on the side of the bed? : None Difficulty sitting down on and standing up from a chair with arms (e.g., wheelchair, bedside commode, etc,.)?: None Help needed moving to and from a bed to chair (including a wheelchair)?: A Little Help needed walking in hospital room?: A Little Help needed climbing 3-5 steps with a railing? : A Little 6 Click Score: 21    End of Session   Activity Tolerance: Patient tolerated treatment well;Patient limited by fatigue Patient left: in chair;with call bell/phone within reach Nurse Communication: Mobility status PT Visit Diagnosis: Unsteadiness on feet (R26.81);Other abnormalities of gait and mobility (R26.89);Muscle weakness (generalized) (M62.81)     Time: 1194-1740 PT Time Calculation (min) (ACUTE ONLY): 27 min  Charges:  $Therapeutic Activity: 23-37 mins                    G Codes:       1:48 PM, 12-07-17 Lonell Grandchild, MPT Physical Therapist with [facility] 336 [phone] office [phone] mobile phone

## 2017-12-05 NOTE — Care Management Note (Addendum)
Case Management Note  Patient Details  Name: Glenda Johnson MRN: [id number] Date of Birth: September 26, 1955  Subjective/Objective:                    Action/Plan: Patient discharging home today. Will resume Home health services of RN, PT and aide. Juliann Pulse of [facility] notified and will obtain orders when available. Attending notified of need for order. Expected Discharge Date:  12/05/17               Expected Discharge Plan:  [place]  In-House Referral:     Discharge planning Services  CM Consult  Post Acute Care Choice:  Home Health, Resumption of Svcs/PTA Provider Choice offered to:     DME Arranged:    DME Agency:     HH Arranged:    [place] Agency:  [place]  Status of Service:  Completed, signed off  If discussed at [place] of Stay Meetings, dates discussed:    Additional Comments:  Rozalia Dino, Chauncey Reading, RN 12/05/2017, 11:32 AM

## 2017-12-05 NOTE — Progress Notes (Signed)
ANTICOAGULATION CONSULT NOTE - follow up [place] for Coumadin Indication: atrial fibrillation  Allergies  Allergen Reactions  . Codeine Anaphylaxis  . Tramadol Anaphylaxis    No hydrocodone or anything connected No hydrocodone or anything connected   . Clarithromycin Nausea And Vomiting  . Citalopram Other (See Comments)    Caused more depression   . Fish Allergy Itching    ears  . Other     Patient says that all narcotics give her anaphylaxis.   . Wellbutrin [Bupropion] Other (See Comments)    "could not speak a full sentence for a whole year"    Patient Measurements: Height: 5\' 4"  (162.6 cm) Weight: 126 lb 5.2 oz (57.3 kg) IBW/kg (Calculated) : 54.7  Vital Signs: Temp: 97.5 F (36.4 C) (06/12 0832) Temp Source: Axillary (06/12 0832) BP: 143/90 (06/12 0500) Pulse Rate: 88 (06/12 0600)  Labs: Recent Labs    12/03/17 0453 12/04/17 0503 12/05/17 0409  LABPROT 30.1* 33.0* 35.9*  INR 2.90 3.26 3.64  CREATININE 1.23* 1.13* 1.06*   Estimated Creatinine Clearance: 46.9 mL/min (A) (by C-G formula based on SCr of 1.06 mg/dL (H)).  Assessment: Pharmacy asked to dose Coumadin. INR above goal.  No bleeding noted. Home dose is 2.5mg  daily.  Goal of Therapy:  INR 2-3 Monitor platelets by anticoagulation protocol: Yes   Plan:  Hold Warfarin today. Daily PT-INR Monitor for S/S of bleeding  Ramond Craver, [facility]  12/05/2017,8:49 AM

## 2017-12-05 NOTE — Discharge Summary (Addendum)
Physician Discharge Summary  Glenda Johnson DPO:[id number] DOB: 1955-07-02 DOA: 11/30/2017  PCP: [name], MD  Admit date: 11/30/2017 Discharge date: 12/05/2017  Time spent: 45 minutes  Recommendations for Outpatient Follow-up:  -To be discharged home today. -Advised to follow-up with primary care physician and cardiologist in 2 weeks, referral for appointment has been made.  Discharge Diagnoses:  Principal Problem:   Acute respiratory failure with hypoxia (HCC) Active Problems:   Elevated troponin   Cardiac mass   CKD (chronic kidney disease) stage 3, GFR 30-59 ml/min (HCC)   COPD exacerbation (HCC)   Atrial fibrillation (HCC)   Acute on chronic diastolic congestive heart failure (HCC)   Lactic acidosis   Acute on chronic respiratory failure with hypoxia (HCC)   LV (left ventricular) mural thrombus without MI   Acute metabolic encephalopathy   Discharge Condition: Stable and improved  Filed Weights   12/03/17 0800 12/04/17 0500 12/05/17 0448  Weight: 60.3 kg (132 lb 15 oz) 60.3 kg (132 lb 15 oz) 57.3 kg (126 lb 5.2 oz)    History of present illness:   As per Dr. Olevia Bowens on 6/8: Glenda Johnson is a 62 y.o. female with medical history significant of atrial fibrillation, chronic diastolic heart failure, COPD, hep C, history of pulmonary thrombosis, renal artery stenosis, secondary hypertension, history of CVA, tobacco use, history of cardiac mass who is coming to the emergency department with complaints of shortness of breath and anxiety.  She mentioned earlier to Dr. Laurann Montana that she has been coughing a lot but this is nonproductive.  When I arrived to the room, the patient was restless, mildly cyanotic with an O2 sat of 76% on of oxygen after she took her nasal cannula off. She had recently given lorazepam 1 mg IVP as well.  She is somnolent, mildly hypoxic, confused and unable to provide further history.  ED Course: Initial temperature 101.7 F, pulse 105, respirations  30, blood pressure 166/92 mmHg and O2 sat 96% on unknown FiO2.  She was given Zosyn and vancomycin.  She was given a partial normal saline bolus, but this was stopped due to acute CHF.  I ordered BiPAP ventilation, Nitropaste to ACW, Solu-Medrol 125 mg IVP x1, Xopenex plus ipratropium neb and 2 g of magnesium sulfate.  Her lab work shows a white count of 11.1 with 67% neutrophils, 21% lymphocytes and 11% monocytes.  Myoglobin was 11.0 g/dL and platelets 518.  Lactic acid #1 was 6.59 and lactic acid number two 4.8 mmol/L.  Troponin #1 was 0.09 and troponin #2 was 0.010 ng/mL.  BNP was over 4500 pg/mL.  ABG showed a pH of 7.29, PCO2 of 28.7 and PO2 of 345 while on BiPAP with a 100% FiO2.\   Hospital Course:   Acute on chronic hypoxemic respiratory failure -Due to acute on chronic diastolic heart failure as well as COPD exacerbation. -Is currently on room air and saturating well, she does wear 2 L of oxygen at baseline which will continue as needed. -Lasix has been transitioned over to p.o. as of 6/11. -She is a total of 7.1 L negative throughout this hospitalization.,  Volume status is currently euvolemic. -2D echo on 6/8 shows ejection fraction of 50 to 55% with grade 2 diastolic dysfunction, diffuse hypokinesis and resolved LV thrombus. -In regards to her COPD exacerbation, she is currently at her baseline to better levels of oxygen, she has no wheezing or obvious respiratory distress on exam.  Will discharge on oral prednisone taper, nebs.  Acute  on chronic metabolic encephalopathy -Likely with a component of hospital delirium. -She did have a recent cardioembolic stroke in March.  Family members have previously stated that she has had intermittent confusion ever since. -Suspect respiratory failure was also playing a role. -Medications might have also been at play.  LV thrombus -Diagnosed during routine echo for stroke in March 2019. -Echo this admission shows resolution of thrombus. -  warfarin will be continued not for this indication but for history of A. fib.  Chronic kidney disease stage III -Baseline creatinine request to be due to 1.1 and 1.4. -Creatinine on admission was 1.43, is 0.06 on discharge.  Atrial fibrillation -Heart rate well controlled, on Coreg, continue warfarin for anticoagulation due to a CHADSVASC score of 5 due to history of congestive heart failure, hypertension, gender and prior history of CVA.  Essential hypertension -Well-controlled, continue home doses of Coreg and hydralazine.    Procedures:  None   Consultations:  None  Discharge Instructions  Discharge Instructions    Diet - low sodium heart healthy   Complete by:  As directed    Increase activity slowly   Complete by:  As directed      Allergies as of 12/05/2017      Reactions   Codeine Anaphylaxis   Tramadol Anaphylaxis   No hydrocodone or anything connected No hydrocodone or anything connected   Clarithromycin Nausea And Vomiting   Citalopram Other (See Comments)   Caused more depression    Fish Allergy Itching   ears   Other    Patient says that all narcotics give her anaphylaxis.    Wellbutrin [bupropion] Other (See Comments)   "could not speak a full sentence for a whole year"      Medication List    STOP taking these medications   amLODipine 5 MG tablet Commonly known as:  NORVASC   doxycycline 100 MG capsule Commonly known as:  VIBRAMYCIN   fluconazole 150 MG tablet Commonly known as:  DIFLUCAN   guaiFENesin 600 MG 12 hr tablet Commonly known as:  MUCINEX     TAKE these medications   albuterol 108 (90 Base) MCG/ACT inhaler Commonly known as:  PROVENTIL HFA;VENTOLIN HFA Inhale 2 puffs into the lungs every 4 (four) hours as needed.   albuterol (2.5 MG/3ML) 0.083% nebulizer solution Commonly known as:  PROVENTIL Take 3 mLs (2.5 mg total) by nebulization every 6 (six) hours as needed for wheezing.   carvedilol 12.5 MG tablet Commonly known  as:  COREG Take 1 tablet (12.5 mg total) by mouth 2 (two) times daily with a meal.   furosemide 40 MG tablet Commonly known as:  LASIX Take 1 tablet (40 mg total) by mouth 2 (two) times daily.   hydrALAZINE 25 MG tablet Commonly known as:  APRESOLINE Take 1 tablet (25 mg total) by mouth 3 (three) times daily.   LORazepam 0.5 MG tablet Commonly known as:  ATIVAN Take 0.5 mg by mouth every 8 (eight) hours as needed for anxiety.   nitroGLYCERIN 0.4 MG SL tablet Commonly known as:  NITROSTAT Place 0.4 mg under the tongue every 5 (five) minutes as needed for chest pain.   pantoprazole 40 MG tablet Commonly known as:  PROTONIX Take 1 tablet (40 mg total) by mouth daily at 12 noon.   potassium chloride SA 20 MEQ tablet Commonly known as:  K-DUR,KLOR-CON Take 2 tablets (40 mEq total) by mouth daily. What changed:    how much to take  when to take  this   predniSONE 10 MG tablet Commonly known as:  DELTASONE Take 1 tablet (10 mg total) by mouth daily with breakfast. Take 6 tablets today and then decrease by 1 tablet daily until none are left.   TRELEGY ELLIPTA 100-62.5-25 MCG/INH Aepb Generic drug:  Fluticasone-Umeclidin-Vilant Inhale 1 puff into the lungs daily.   warfarin 5 MG tablet Commonly known as:  COUMADIN Take 1 tablet (5 mg total) by mouth daily.      Allergies  Allergen Reactions  . Codeine Anaphylaxis  . Tramadol Anaphylaxis    No hydrocodone or anything connected No hydrocodone or anything connected   . Clarithromycin Nausea And Vomiting  . Citalopram Other (See Comments)    Caused more depression   . Fish Allergy Itching    ears  . Other     Patient says that all narcotics give her anaphylaxis.   . Wellbutrin [Bupropion] Other (See Comments)    "could not speak a full sentence for a whole year"   Follow-up Information    [name], MD. Schedule an appointment as soon as possible for a visit in 2 week(s).   Specialty:  Family Medicine Contact  information: [place] [phone]        Herminio Commons, MD. Schedule an appointment as soon as possible for a visit in 2 week(s).   Specialty:  Cardiology Contact information: [place] [address] [phone]            The results of significant diagnostics from this hospitalization (including imaging, microbiology, ancillary and laboratory) are listed below for reference.    Significant Diagnostic Studies: Dg Chest Port 1 View  Result Date: 12/01/2017 CLINICAL DATA:  Sepsis EXAM: PORTABLE CHEST 1 VIEW COMPARISON:  December 01, 2017 FINDINGS: There is cardiomegaly with pulmonary venous hypertension. There is mild interstitial edema. There are pleural effusions bilaterally. There is bibasilar atelectasis. There is no frank airspace consolidation. There is aortic atherosclerosis. No evident adenopathy. No bone lesions. IMPRESSION: Pulmonary vascular congestion with pleural effusions and interstitial edema. The appearance is felt to represent a degree of congestive heart failure. There is bibasilar atelectasis. There is no frank consolidation. Electronically Signed   By: Lowella Grip III M.D.   On: 12/01/2017 07:15   Dg Chest Port 1 View  Result Date: 12/01/2017 CLINICAL DATA:  Code sepsis with nonproductive cough and shortness-of-breath as well as anxiety today. EXAM: PORTABLE CHEST 1 VIEW COMPARISON:  11/01/2017 FINDINGS: Lungs are hypoinflated with hazy prominence of the perihilar markings with mild bibasilar opacification. Findings likely due to mild vascular congestion with small bilateral pleural effusions. Vascular congestion unchanged as effusion slightly improved. Cardiomediastinal silhouette and remainder of the exam is unchanged. IMPRESSION: Findings suggesting mild vascular congestion without significant change and small bilateral pleural effusions slightly improved. Electronically Signed   By: Marin Olp M.D.   On:  12/01/2017 00:50    Microbiology: Recent Results (from the past 240 hour(s))  Blood Culture (routine x 2)     Status: None (Preliminary result)   Collection Time: 11/30/17 11:50 PM  Result Value Ref Range Status   Specimen Description LEFT ANTECUBITAL  Final   Special Requests   Final    BOTTLES DRAWN AEROBIC AND ANAEROBIC Blood Culture adequate volume   Culture   Final    NO GROWTH 4 DAYS Performed at [facility], [address], [place], [place] [address]    Report Status PENDING  Incomplete  Blood Culture (  routine x 2)     Status: None (Preliminary result)   Collection Time: 12/01/17 12:03 AM  Result Value Ref Range Status   Specimen Description BLOOD RIGHT FOREARM  Final   Special Requests   Final    BOTTLES DRAWN AEROBIC AND ANAEROBIC Blood Culture adequate volume   Culture   Final    NO GROWTH 4 DAYS Performed at [facility], [address] [place], [place] [address]    Report Status PENDING  Incomplete  Urine culture     Status: None   Collection Time: 12/01/17 12:06 AM  Result Value Ref Range Status   Specimen Description   Final    URINE, CLEAN CATCH Performed at [facility], [address], [place], [place] [address]    Special Requests   Final    NONE Performed at [facility], [address] [place], [place] [address]    Culture   Final    NO GROWTH Performed at [facility], [place] [address] [place], [place] [address]    Report Status 12/02/2017 FINAL  Final  MRSA PCR Screening     Status: Abnormal   Collection Time: 12/01/17  4:04 AM  Result Value Ref Range Status   MRSA by PCR POSITIVE (A) NEGATIVE Final    Comment: RESULT CALLED TO, READ BACK BY AND VERIFIED WITH: [email or web address] @ 1600 ON 6.8.19 BY L.BOWMAN        The GeneXpert MRSA Assay (FDA approved for NASAL specimens only), is one component of a comprehensive MRSA colonization surveillance program. It is not intended to diagnose MRSA infection nor to guide or monitor treatment for MRSA  infections. Performed at [facility], [address] [place], [place] [address]   Respiratory Panel by PCR     Status: None   Collection Time: 12/01/17 12:30 PM  Result Value Ref Range Status   Adenovirus NOT DETECTED NOT DETECTED Final   Coronavirus 229E NOT DETECTED NOT DETECTED Final   Coronavirus HKU1 NOT DETECTED NOT DETECTED Final   Coronavirus NL63 NOT DETECTED NOT DETECTED Final   Coronavirus OC43 NOT DETECTED NOT DETECTED Final   Metapneumovirus NOT DETECTED NOT DETECTED Final   Rhinovirus / Enterovirus NOT DETECTED NOT DETECTED Final   Influenza A NOT DETECTED NOT DETECTED Final   Influenza B NOT DETECTED NOT DETECTED Final   Parainfluenza Virus 1 NOT DETECTED NOT DETECTED Final   Parainfluenza Virus 2 NOT DETECTED NOT DETECTED Final   Parainfluenza Virus 3 NOT DETECTED NOT DETECTED Final   Parainfluenza Virus 4 NOT DETECTED NOT DETECTED Final   Respiratory Syncytial Virus NOT DETECTED NOT DETECTED Final   Bordetella pertussis NOT DETECTED NOT DETECTED Final   Chlamydophila pneumoniae NOT DETECTED NOT DETECTED Final   Mycoplasma pneumoniae NOT DETECTED NOT DETECTED Final    Comment: Performed at [facility], [place] [address], [place],  [address]     Labs: Basic Metabolic Panel: Recent Labs  Lab 12/01/17 0253 12/01/17 0758 12/02/17 0502 12/03/17 0453 12/04/17 0503 12/05/17 0409  NA  --  135 137 140 142 140  K  --  3.6 3.5 3.6 3.1* 3.6  CL  --  104 105 103 104 102  CO2  --  20* 21* 26 26 29   GLUCOSE  --  148* 161* 164* 175* 152*  BUN  --  14 21* 24* 28* 30*  CREATININE  --  1.29* 1.24* 1.23* 1.13* 1.06*  CALCIUM  --  8.5* 8.8* 9.0 8.9 9.0  MG 1.7 2.1 1.9 2.0 1.9  --   PHOS 4.3  --   --   --   --   --    Liver Function Tests: Recent Labs  Lab 12/01/17 0005  AST 17  ALT 14  ALKPHOS 81  BILITOT 1.8*  PROT 7.6  ALBUMIN 3.4*   No results for input(s): LIPASE, AMYLASE in the last 168 hours. No results for input(s): AMMONIA in the last 168  hours. CBC: Recent Labs  Lab 12/01/17 0005 12/02/17 0502  WBC 11.1* 8.3  NEUTROABS 7.4 7.0  HGB 11.0* 9.4*  HCT 39.9 32.8*  MCV 78.5 76.5*  PLT 518* 297   Cardiac Enzymes: Recent Labs  Lab 12/01/17 0005 12/01/17 0254 12/01/17 0758 12/01/17 1445  TROPONINI 0.09* 0.10* 0.15* 0.11*   BNP: BNP (last 3 results) Recent Labs    09/17/17 0237 10/09/17 1232 12/01/17 0005  BNP 3,035.3* >4,500.0* >4,500.0*    ProBNP (last 3 results) No results for input(s): PROBNP in the last 8760 hours.  CBG: No results for input(s): GLUCAP in the last 168 hours.     Signed:  Lelon Frohlich  [facility] Pager: [phone] 12/05/2017, 10:55 AM

## 2017-12-05 NOTE — Plan of Care (Signed)
Patient met discharge and has improved from admission.

## 2017-12-06 LAB — CULTURE, BLOOD (ROUTINE X 2)
CULTURE: NO GROWTH
CULTURE: NO GROWTH
SPECIAL REQUESTS: ADEQUATE
Special Requests: ADEQUATE

## 2017-12-07 ENCOUNTER — Emergency Department (HOSPITAL_COMMUNITY): Payer: BLUE CROSS/BLUE SHIELD

## 2017-12-07 ENCOUNTER — Inpatient Hospital Stay (HOSPITAL_COMMUNITY)
Admission: EM | Admit: 2017-12-07 | Discharge: 2017-12-13 | DRG: 100 | Disposition: A | Discharge status: Home / Self Care | Payer: BLUE CROSS/BLUE SHIELD | Attending: Family Medicine | Admitting: Family Medicine

## 2017-12-07 DIAGNOSIS — T502X5A Adverse effect of carbonic-anhydrase inhibitors, benzothiadiazides and other diuretics, initial encounter: Secondary | ICD-10-CM | POA: Diagnosis present

## 2017-12-07 DIAGNOSIS — E876 Hypokalemia: Secondary | ICD-10-CM | POA: Diagnosis present

## 2017-12-07 DIAGNOSIS — Z885 Allergy status to narcotic agent status: Secondary | ICD-10-CM | POA: Diagnosis not present

## 2017-12-07 DIAGNOSIS — K219 Gastro-esophageal reflux disease without esophagitis: Secondary | ICD-10-CM | POA: Diagnosis present

## 2017-12-07 DIAGNOSIS — N760 Acute vaginitis: Secondary | ICD-10-CM | POA: Diagnosis not present

## 2017-12-07 DIAGNOSIS — Z86711 Personal history of pulmonary embolism: Secondary | ICD-10-CM | POA: Diagnosis not present

## 2017-12-07 DIAGNOSIS — Z8673 Personal history of transient ischemic attack (TIA), and cerebral infarction without residual deficits: Secondary | ICD-10-CM | POA: Diagnosis not present

## 2017-12-07 DIAGNOSIS — F0390 Unspecified dementia without behavioral disturbance: Secondary | ICD-10-CM | POA: Diagnosis present

## 2017-12-07 DIAGNOSIS — I5033 Acute on chronic diastolic (congestive) heart failure: Secondary | ICD-10-CM | POA: Diagnosis present

## 2017-12-07 DIAGNOSIS — R55 Syncope and collapse: Secondary | ICD-10-CM | POA: Diagnosis present

## 2017-12-07 DIAGNOSIS — G9340 Encephalopathy, unspecified: Secondary | ICD-10-CM | POA: Diagnosis present

## 2017-12-07 DIAGNOSIS — B1920 Unspecified viral hepatitis C without hepatic coma: Secondary | ICD-10-CM | POA: Diagnosis present

## 2017-12-07 DIAGNOSIS — N183 Chronic kidney disease, stage 3 unspecified: Secondary | ICD-10-CM | POA: Diagnosis present

## 2017-12-07 DIAGNOSIS — I480 Paroxysmal atrial fibrillation: Secondary | ICD-10-CM | POA: Diagnosis present

## 2017-12-07 DIAGNOSIS — J449 Chronic obstructive pulmonary disease, unspecified: Secondary | ICD-10-CM | POA: Diagnosis present

## 2017-12-07 DIAGNOSIS — Z79899 Other long term (current) drug therapy: Secondary | ICD-10-CM

## 2017-12-07 DIAGNOSIS — R0902 Hypoxemia: Secondary | ICD-10-CM | POA: Diagnosis not present

## 2017-12-07 DIAGNOSIS — R569 Unspecified convulsions: Principal | ICD-10-CM

## 2017-12-07 DIAGNOSIS — Z888 Allergy status to other drugs, medicaments and biological substances status: Secondary | ICD-10-CM

## 2017-12-07 DIAGNOSIS — I130 Hypertensive heart and chronic kidney disease with heart failure and stage 1 through stage 4 chronic kidney disease, or unspecified chronic kidney disease: Secondary | ICD-10-CM | POA: Diagnosis present

## 2017-12-07 DIAGNOSIS — I700 Atherosclerosis of aorta: Secondary | ICD-10-CM | POA: Diagnosis present

## 2017-12-07 DIAGNOSIS — Z7901 Long term (current) use of anticoagulants: Secondary | ICD-10-CM | POA: Diagnosis not present

## 2017-12-07 DIAGNOSIS — I6340 Cerebral infarction due to embolism of unspecified cerebral artery: Secondary | ICD-10-CM | POA: Diagnosis present

## 2017-12-07 DIAGNOSIS — Z91013 Allergy to seafood: Secondary | ICD-10-CM

## 2017-12-07 DIAGNOSIS — I4891 Unspecified atrial fibrillation: Secondary | ICD-10-CM | POA: Diagnosis present

## 2017-12-07 DIAGNOSIS — D509 Iron deficiency anemia, unspecified: Secondary | ICD-10-CM | POA: Diagnosis present

## 2017-12-07 DIAGNOSIS — Z7952 Long term (current) use of systemic steroids: Secondary | ICD-10-CM

## 2017-12-07 LAB — I-STAT CHEM 8, ED
BUN: 38 mg/dL — ABNORMAL HIGH (ref 6–20)
CALCIUM ION: 1.06 mmol/L — AB (ref 1.15–1.40)
CHLORIDE: 101 mmol/L (ref 101–111)
Creatinine, Ser: 1.2 mg/dL — ABNORMAL HIGH (ref 0.44–1.00)
Glucose, Bld: 100 mg/dL — ABNORMAL HIGH (ref 65–99)
HCT: 40 % (ref 36.0–46.0)
Hemoglobin: 13.6 g/dL (ref 12.0–15.0)
Potassium: 4.2 mmol/L (ref 3.5–5.1)
SODIUM: 140 mmol/L (ref 135–145)
TCO2: 26 mmol/L (ref 22–32)

## 2017-12-07 LAB — CK: CK TOTAL: 47 U/L (ref 38–234)

## 2017-12-07 LAB — TROPONIN I: TROPONIN I: 0.07 ng/mL — AB (ref <0.03)

## 2017-12-07 LAB — URINALYSIS, ROUTINE W REFLEX MICROSCOPIC
Bacteria, UA: NONE SEEN
Bilirubin Urine: NEGATIVE
Glucose, UA: NEGATIVE mg/dL
Ketones, ur: NEGATIVE mg/dL
Leukocytes, UA: NEGATIVE
Nitrite: NEGATIVE
Protein, ur: 100 mg/dL — AB
SPECIFIC GRAVITY, URINE: 1.019 (ref 1.005–1.030)
pH: 5 (ref 5.0–8.0)

## 2017-12-07 LAB — COMPREHENSIVE METABOLIC PANEL
ALT: 17 U/L (ref 14–54)
ANION GAP: 16 — AB (ref 5–15)
AST: 26 U/L (ref 15–41)
Albumin: 3.3 g/dL — ABNORMAL LOW (ref 3.5–5.0)
Alkaline Phosphatase: 55 U/L (ref 38–126)
BUN: 30 mg/dL — ABNORMAL HIGH (ref 6–20)
CALCIUM: 9 mg/dL (ref 8.9–10.3)
CHLORIDE: 101 mmol/L (ref 101–111)
CO2: 24 mmol/L (ref 22–32)
Creatinine, Ser: 1.49 mg/dL — ABNORMAL HIGH (ref 0.44–1.00)
GFR calc non Af Amer: 36 mL/min — ABNORMAL LOW (ref >60)
GFR, EST AFRICAN AMERICAN: 42 mL/min — AB (ref >60)
Glucose, Bld: 97 mg/dL (ref 65–99)
POTASSIUM: 4.1 mmol/L (ref 3.5–5.1)
SODIUM: 141 mmol/L (ref 135–145)
Total Bilirubin: 2.6 mg/dL — ABNORMAL HIGH (ref 0.3–1.2)
Total Protein: 6.4 g/dL — ABNORMAL LOW (ref 6.5–8.1)

## 2017-12-07 LAB — CBG MONITORING, ED: GLUCOSE-CAPILLARY: 107 mg/dL — AB (ref 65–99)

## 2017-12-07 LAB — PROTIME-INR
INR: 3.67
Prothrombin Time: 36.2 seconds — ABNORMAL HIGH (ref 11.4–15.2)

## 2017-12-07 LAB — CBC WITH DIFFERENTIAL/PLATELET
BASOS ABS: 0 10*3/uL (ref 0.0–0.1)
Basophils Relative: 0 %
Eosinophils Absolute: 0 10*3/uL (ref 0.0–0.7)
Eosinophils Relative: 0 %
HEMATOCRIT: 41.6 % (ref 36.0–46.0)
Hemoglobin: 11 g/dL — ABNORMAL LOW (ref 12.0–15.0)
LYMPHS ABS: 1.5 10*3/uL (ref 0.7–4.0)
LYMPHS PCT: 12 %
MCH: 20.5 pg — ABNORMAL LOW (ref 26.0–34.0)
MCHC: 26.4 g/dL — ABNORMAL LOW (ref 30.0–36.0)
MCV: 77.6 fL — ABNORMAL LOW (ref 78.0–100.0)
MONOS PCT: 9 %
Monocytes Absolute: 1.2 10*3/uL — ABNORMAL HIGH (ref 0.1–1.0)
NEUTROS PCT: 79 %
Neutro Abs: 10.2 10*3/uL — ABNORMAL HIGH (ref 1.7–7.7)
Platelets: 443 10*3/uL — ABNORMAL HIGH (ref 150–400)
RBC: 5.36 MIL/uL — AB (ref 3.87–5.11)
RDW: 21.5 % — AB (ref 11.5–15.5)
WBC: 12.9 10*3/uL — AB (ref 4.0–10.5)

## 2017-12-07 LAB — RAPID URINE DRUG SCREEN, HOSP PERFORMED
AMPHETAMINES: NOT DETECTED
Benzodiazepines: NOT DETECTED
Cocaine: NOT DETECTED
OPIATES: NOT DETECTED
Tetrahydrocannabinol: NOT DETECTED

## 2017-12-07 LAB — BRAIN NATRIURETIC PEPTIDE: B Natriuretic Peptide: >4500 pg/mL — ABNORMAL HIGH (ref 0.0–100.0)

## 2017-12-07 MED ORDER — FUROSEMIDE 10 MG/ML IJ SOLN
40.0000 mg | Freq: Once | INTRAMUSCULAR | Status: AC
Start: 2017-12-07 — End: 2017-12-07
  Administered 2017-12-07: 40 mg via INTRAVENOUS
  Filled 2017-12-07: qty 4

## 2017-12-07 MED ORDER — CARVEDILOL 12.5 MG PO TABS
12.5000 mg | ORAL_TABLET | Freq: Once | ORAL | Status: AC
  Administered 2017-12-07: 12.5 mg via ORAL
  Filled 2017-12-07: qty 1

## 2017-12-07 MED ORDER — HYDRALAZINE HCL 25 MG PO TABS
25.0000 mg | ORAL_TABLET | Freq: Once | ORAL | Status: AC
  Administered 2017-12-07: 25 mg via ORAL
  Filled 2017-12-07: qty 1

## 2017-12-07 MED ORDER — DEXTROSE 5 % IV SOLN
5.0000 mg/h | INTRAVENOUS | Status: DC
  Administered 2017-12-07: 5 mg/h via INTRAVENOUS
  Filled 2017-12-07: qty 100

## 2017-12-07 MED ORDER — DILTIAZEM LOAD VIA INFUSION
15.0000 mg | Freq: Once | INTRAVENOUS | Status: AC
  Administered 2017-12-07: 15 mg via INTRAVENOUS
  Filled 2017-12-07: qty 15

## 2017-12-07 NOTE — ED Triage Notes (Signed)
Pt arrived via [facility] em[facility] from home after family found pt on floor today at 1630. Per ems, pt has no recollection of event; stating her last memory of events was "last night". Pt is alert and oriented but has moments of questionable lucidity at time of triage. VSS.

## 2017-12-07 NOTE — ED Provider Notes (Signed)
[place] EMERGENCY DEPARTMENT Provider Note   CSN: [id number] Arrival date & time: 12/07/17  1806     History   Chief Complaint Chief Complaint  Patient presents with  . Loss of Consciousness    HPI Glenda Johnson is a 62 y.o. female.  HPI  62 year old female with a history of A. fib, COPD, CHF, presents with syncope.  History is limited as the patient does not know what happened.  She was recently discharged from the hospital a couple days ago.  At that time she was being treated for a pulmonary infection and CHF.  She tells me that her sister came to check on her this afternoon and found her laying on the floor.  The last thing she remembers was lights that she thinks were street lights or lights outside of her house last night.  She does not have a headache, chest pain, abdominal pain, worsening shortness of breath, or vomiting.  She denies any dizziness or focal weakness. Sh states she has some right foot pain that started on the way to the hospital. She has feces on her.  EMS reported that her sister said she had slurred speech but the patient states it feels like her mouth is dry rather than her having slurred speech.  Past Medical History:  Diagnosis Date  . Atrial fibrillation ([place])   . CHF (congestive heart failure) ([place])   . COPD (chronic obstructive pulmonary disease) ([place])   . Hepatitis C   . Pulmonary thrombosis ([place])   . Renal artery stenosis ([place])   . Secondary hypertension   . Stroke ([place])   . Tobacco abuse     Patient Active Problem List   Diagnosis Date Noted  . HCAP (healthcare-associated pneumonia) 12/01/2017  . Acute on chronic diastolic congestive heart failure ([place]) 12/01/2017  . Lactic acidosis 12/01/2017  . Acute on chronic respiratory failure with hypoxia ([place]) 12/01/2017  . Sepsis due to undetermined organism ([place]) 12/01/2017  . LV (left ventricular) mural thrombus without MI 12/01/2017  . Acute metabolic encephalopathy  24/58/0998  . Severe sepsis ([place]) 11/01/2017  . Tobacco abuse   . Renal artery stenosis ([place])   . Stroke ([place])   . Secondary hypertension   . COPD exacerbation ([place]) 10/31/2017  . Atrial fibrillation ([place])   . CKD (chronic kidney disease) stage 3, GFR 30-59 ml/min (HCC) 10/09/2017  . Hypokalemia 10/09/2017  . Dyspnea 10/09/2017  . CHF (congestive heart failure) ([place]) 10/09/2017  . Cerebral thrombosis with cerebral infarction 09/20/2017  . Congestive heart failure ([place])   . Cerebrovascular accident (CVA) due to embolism of cerebral artery ([place])   . Cardiac mass   . Anticoagulation adequate   . Flash pulmonary edema ([place]) 09/15/2017  . Community acquired pneumonia   . Abnormal echocardiogram   . Hypoxia 05/22/2016  . Acute respiratory failure with hypoxia ([place]) 05/22/2016  . Acute respiratory failure ([place]) 05/22/2016  . Sepsis ([place]) 05/22/2016  . Elevated troponin 05/22/2016  . COPD (chronic obstructive pulmonary disease) ([place]) 05/22/2016  . AKI (acute kidney injury) ([place]) 05/22/2016  . Acute pulmonary edema [facility])     Past Surgical History:  Procedure Laterality Date  . BACK SURGERY    . CARDIAC CATHETERIZATION N/A 05/25/2016   Procedure: Left Heart Cath and Coronary Angiography;  Surgeon: Lorretta Harp, MD;  Location: [facility];  Service: Cardiovascular;  Laterality: N/A;  . PLACEMENT OF BREAST IMPLANTS Bilateral   . stent renal artery  OB History    Gravida  1   Para  1   Term  1   Preterm      AB      Living        SAB      TAB      Ectopic      Multiple      Live Births               Home Medications    Prior to Admission medications   Medication Sig Start Date End Date Taking? Authorizing Provider  albuterol (PROVENTIL HFA;VENTOLIN HFA) 108 (90 Base) MCG/ACT inhaler Inhale 2 puffs into the lungs every 4 (four) hours as needed. 09/10/17 09/10/18  [provider]  albuterol (PROVENTIL) (2.5 MG/3ML) 0.083% nebulizer  solution Take 3 mLs (2.5 mg total) by nebulization every 6 (six) hours as needed for wheezing. 11/05/17   Kathie Dike, MD  carvedilol (COREG) 12.5 MG tablet Take 1 tablet (12.5 mg total) by mouth 2 (two) times daily with a meal. 09/24/17   Velvet Bathe, MD  Fluticasone-Umeclidin-Vilant (TRELEGY ELLIPTA) 100-62.5-25 MCG/INH AEPB Inhale 1 puff into the lungs daily.  10/30/17   [provider]  furosemide (LASIX) 40 MG tablet Take 1 tablet (40 mg total) by mouth 2 (two) times daily. 11/05/17 12/05/17  Kathie Dike, MD  hydrALAZINE (APRESOLINE) 25 MG tablet Take 1 tablet (25 mg total) by mouth 3 (three) times daily. 11/05/17   Kathie Dike, MD  LORazepam (ATIVAN) 0.5 MG tablet Take 0.5 mg by mouth every 8 (eight) hours as needed for anxiety.     [provider]  nitroGLYCERIN (NITROSTAT) 0.4 MG SL tablet Place 0.4 mg under the tongue every 5 (five) minutes as needed for chest pain.    [provider]  pantoprazole (PROTONIX) 40 MG tablet Take 1 tablet (40 mg total) by mouth daily at 12 noon. 09/25/17   Velvet Bathe, MD  potassium chloride SA (K-DUR,KLOR-CON) 20 MEQ tablet Take 2 tablets (40 mEq total) by mouth daily. Patient taking differently: Take 20 mEq by mouth 2 (two) times daily.  10/10/17 12/03/17  Johnson, Clanford L, MD  predniSONE (DELTASONE) 10 MG tablet Take 1 tablet (10 mg total) by mouth daily with breakfast. Take 6 tablets today and then decrease by 1 tablet daily until none are left. 12/05/17   Isaac Bliss, Rayford Halsted, MD  warfarin (COUMADIN) 5 MG tablet Take 1 tablet (5 mg total) by mouth daily. 09/24/17   Velvet Bathe, MD    Family History Family History  Problem Relation Age of Onset  . Diabetes Maternal Grandmother     Social History Social History   Tobacco Use  . Smoking status: Former Research scientist (life sciences)  . Smokeless tobacco: Never Used  Substance Use Topics  . Alcohol use: No  . Drug use: No     Allergies   Codeine; Tramadol; Clarithromycin;  Citalopram; Fish allergy; Other; and Wellbutrin [bupropion]   Review of Systems Review of Systems  Constitutional: Negative for fever.  Respiratory: Positive for cough. Negative for shortness of breath.   Cardiovascular: Positive for leg swelling (chronic). Negative for chest pain.  Gastrointestinal: Negative for abdominal pain and vomiting.  Musculoskeletal: Positive for arthralgias (right foot).  Neurological: Negative for dizziness and headaches.  All other systems reviewed and are negative.    Physical Exam Updated Vital Signs BP (!) 170/107   Pulse 60   Temp 98.5 F (36.9 C) (Rectal)   Resp (!) 21  SpO2 94%   Physical Exam  Constitutional: She appears well-developed and well-nourished.  HENT:  Head: Normocephalic and atraumatic.  Right Ear: External ear normal.  Left Ear: External ear normal.  Nose: Nose normal.  Mouth/Throat: Mucous membranes are dry.  Eyes: Pupils are equal, round, and reactive to light. EOM are normal. Right eye exhibits no discharge. Left eye exhibits no discharge.  Neck: Normal range of motion. Neck supple. No spinous process tenderness present. Normal range of motion present.  Cardiovascular: Normal rate, regular rhythm and normal heart sounds.  Pulmonary/Chest: Effort normal and breath sounds normal.  Abdominal: Soft. There is no tenderness.  Musculoskeletal: She exhibits edema (pitting edema to BLE. R foot more swollen than left, calves are equal).  Mild tenderness to right pinky toe  Neurological: She is alert.  Awake, alert, oriented to person, place and month/year/president. Does not know day of week. CN 3-12 grossly intact. 5/5 strength in all 4 extremities. Grossly normal sensation. Normal finger to nose.   Skin: Skin is warm and dry. She is not diaphoretic.  Nursing note and vitals reviewed.    ED Treatments / Results  Labs (all labs ordered are listed, but only abnormal results are displayed) Labs Reviewed  COMPREHENSIVE  METABOLIC PANEL - Abnormal; Notable for the following components:      Result Value   BUN 30 (*)    Creatinine, Ser 1.49 (*)    Total Protein 6.4 (*)    Albumin 3.3 (*)    Total Bilirubin 2.6 (*)    GFR calc non Af Amer 36 (*)    GFR calc Af Amer 42 (*)    Anion gap 16 (*)    All other components within normal limits  TROPONIN I - Abnormal; Notable for the following components:   Troponin I 0.07 (*)    All other components within normal limits  CBC WITH DIFFERENTIAL/PLATELET - Abnormal; Notable for the following components:   WBC 12.9 (*)    RBC 5.36 (*)    Hemoglobin 11.0 (*)    MCV 77.6 (*)    MCH 20.5 (*)    MCHC 26.4 (*)    RDW 21.5 (*)    Platelets 443 (*)    All other components within normal limits  PROTIME-INR - Abnormal; Notable for the following components:   Prothrombin Time 36.2 (*)    All other components within normal limits  I-STAT CHEM 8, ED - Abnormal; Notable for the following components:   BUN 38 (*)    Creatinine, Ser 1.20 (*)    Glucose, Bld 100 (*)    Calcium, Ion 1.06 (*)    All other components within normal limits  CBG MONITORING, ED - Abnormal; Notable for the following components:   Glucose-Capillary 107 (*)    All other components within normal limits  CK  ETHANOL  BRAIN NATRIURETIC PEPTIDE  URINALYSIS, ROUTINE W REFLEX MICROSCOPIC  RAPID URINE DRUG SCREEN, HOSP PERFORMED    EKG EKG Interpretation  Date/Time:  Friday December 07 2017 21:52:03 EDT Ventricular Rate:  112 PR Interval:    QRS Duration: 83 QT Interval:  346 QTC Calculation: 438 R Axis:   22 Text Interpretation:  Atrial fibrillation with RVR Confirmed by Sherwood Gambler [phone]) on 12/07/2017 10:15:07 PM   Radiology Dg Chest 2 View  Result Date: 12/07/2017 CLINICAL DATA:  Syncope.  History of chronic heart failure and COPD. EXAM: CHEST - 2 VIEW COMPARISON:  Single-view of the chest 12/01/2017 11/01/2017. FINDINGS: Small to moderate  bilateral pleural effusions and basilar  atelectasis appear somewhat improved compared to the most recent examination. Cardiac silhouette is largely obscured. No evidence of pulmonary edema. Aortic atherosclerosis is noted. No acute bony abnormality. IMPRESSION: Small to moderate bilateral pleural effusions and basilar atelectasis appear improved since the most recent examination. Atherosclerosis. Electronically Signed   By: Inge Rise M.D.   On: 12/07/2017 19:29   Ct Head Wo Contrast  Result Date: 12/07/2017 CLINICAL DATA:  Patient found down at 4:30 p.m. today. EXAM: CT HEAD WITHOUT CONTRAST TECHNIQUE: Contiguous axial images were obtained from the base of the skull through the vertex without intravenous contrast. COMPARISON:  Head CT 09/20/2017.  Brain MRI 09/19/2017. FINDINGS: Brain: No evidence of acute infarction, hemorrhage, hydrocephalus, extra-axial collection or mass lesion/mass effect. Atrophy, chronic microvascular ischemic change and remote right occipital infarct are identified. Additional smaller infarcts seen on MRI are difficult to appreciate on this exam. Vascular: No hyperdense vessel or unexpected calcification. Skull: Intact. Sinuses/Orbits: Negative. Other: None. IMPRESSION: No acute abnormality. Atrophy, chronic microvascular ischemic change and remote infarcts. Electronically Signed   By: Inge Rise M.D.   On: 12/07/2017 19:53   Dg Foot Complete Right  Result Date: 12/07/2017 CLINICAL DATA:  Right foot pain.  No known injury. EXAM: RIGHT FOOT COMPLETE - 3+ VIEW COMPARISON:  None. FINDINGS: No acute bony or joint abnormality is seen. Mild first MTP osteoarthritis is noted. Large calcaneal spur at the Achilles tendon insertion is noted. The patient also has a small plantar calcaneal spur. IMPRESSION: No acute abnormality. Mild first MTP osteoarthritis. Calcaneal spurring. Electronically Signed   By: Inge Rise M.D.   On: 12/07/2017 19:30    Procedures .Critical Care Performed by: Sherwood Gambler,  MD Authorized by: Sherwood Gambler, MD   Critical care provider statement:    Critical care time (minutes):  30   Critical care time was exclusive of:  Separately billable procedures and treating other patients   Critical care was necessary to treat or prevent imminent or life-threatening deterioration of the following conditions:  Circulatory failure   Critical care was time spent personally by me on the following activities:  Development of treatment plan with patient or surrogate, discussions with consultants, evaluation of patient's response to treatment, examination of patient, obtaining history from patient or surrogate, review of old charts, re-evaluation of patient's condition, pulse oximetry, ordering and review of radiographic studies, ordering and review of laboratory studies and ordering and performing treatments and interventions   (including critical care time)  Medications Ordered in ED Medications  carvedilol (COREG) tablet 12.5 mg (has no administration in time range)  hydrALAZINE (APRESOLINE) tablet 25 mg (has no administration in time range)  diltiazem (CARDIZEM) 1 mg/mL load via infusion 15 mg (has no administration in time range)    And  diltiazem (CARDIZEM) 100 mg in dextrose 5 % 100 mL (1 mg/mL) infusion (has no administration in time range)  furosemide (LASIX) injection 40 mg (has no administration in time range)     Initial Impression / Assessment and Plan / ED Course  I have reviewed the triage vital signs and the nursing notes.  Pertinent labs & imaging results that were available during my care of the patient were reviewed by me and considered in my medical decision making (see chart for details).     The patient is tachycardic and hypertensive.  I suspect she has not taken her meds since she left [facility] couple days ago.  She appears to still have some  fluid in her lungs and will be given Lasix.  With the tachycardia and hypertension she will also be given  Cardizem as she appears to have A. fib with RVR.  She is not critically confused but when I talked to the sister she is not quite herself either.  CT head negative.  I think she will need to be readmitted based on multiple comorbidities with the A. fib with RVR and clear inability to take care of herself.  Low level troponin is probably from CHF and the A. fib.  Discussed with Dr. Hal Hope, will admit and also get an MRI.  Final Clinical Impressions(s) / ED Diagnoses   Final diagnoses:  Atrial fibrillation with RVR [facility])    ED Discharge Orders    None       Sherwood Gambler, MD 12/07/17 2219

## 2017-12-07 NOTE — ED Notes (Signed)
Sister Arlyce Harman called and would love an update if possible.   [phone]

## 2017-12-08 ENCOUNTER — Encounter (HOSPITAL_COMMUNITY): Payer: Self-pay

## 2017-12-08 ENCOUNTER — Observation Stay (HOSPITAL_COMMUNITY): Payer: BLUE CROSS/BLUE SHIELD

## 2017-12-08 ENCOUNTER — Other Ambulatory Visit: Payer: Self-pay

## 2017-12-08 DIAGNOSIS — R4182 Altered mental status, unspecified: Secondary | ICD-10-CM | POA: Diagnosis not present

## 2017-12-08 DIAGNOSIS — R55 Syncope and collapse: Secondary | ICD-10-CM

## 2017-12-08 DIAGNOSIS — I5033 Acute on chronic diastolic (congestive) heart failure: Secondary | ICD-10-CM | POA: Diagnosis not present

## 2017-12-08 DIAGNOSIS — I4891 Unspecified atrial fibrillation: Secondary | ICD-10-CM | POA: Diagnosis not present

## 2017-12-08 LAB — COMPREHENSIVE METABOLIC PANEL
ALBUMIN: 3.1 g/dL — AB (ref 3.5–5.0)
ALK PHOS: 53 U/L (ref 38–126)
ALT: 15 U/L (ref 14–54)
ANION GAP: 12 (ref 5–15)
AST: 18 U/L (ref 15–41)
BUN: 30 mg/dL — ABNORMAL HIGH (ref 6–20)
CO2: 26 mmol/L (ref 22–32)
Calcium: 8.5 mg/dL — ABNORMAL LOW (ref 8.9–10.3)
Chloride: 103 mmol/L (ref 101–111)
Creatinine, Ser: 1.37 mg/dL — ABNORMAL HIGH (ref 0.44–1.00)
GFR calc non Af Amer: 40 mL/min — ABNORMAL LOW (ref >60)
GFR, EST AFRICAN AMERICAN: 46 mL/min — AB (ref >60)
GLUCOSE: 99 mg/dL (ref 65–99)
Potassium: 3.3 mmol/L — ABNORMAL LOW (ref 3.5–5.1)
SODIUM: 141 mmol/L (ref 135–145)
Total Bilirubin: 2.3 mg/dL — ABNORMAL HIGH (ref 0.3–1.2)
Total Protein: 6 g/dL — ABNORMAL LOW (ref 6.5–8.1)

## 2017-12-08 LAB — CBC
HEMATOCRIT: 37.3 % (ref 36.0–46.0)
HEMOGLOBIN: 10.4 g/dL — AB (ref 12.0–15.0)
MCH: 21 pg — AB (ref 26.0–34.0)
MCHC: 27.9 g/dL — AB (ref 30.0–36.0)
MCV: 75.2 fL — ABNORMAL LOW (ref 78.0–100.0)
Platelets: 394 10*3/uL (ref 150–400)
RBC: 4.96 MIL/uL (ref 3.87–5.11)
RDW: 21.2 % — ABNORMAL HIGH (ref 11.5–15.5)
WBC: 10.5 10*3/uL (ref 4.0–10.5)

## 2017-12-08 LAB — FERRITIN: Ferritin: 57 ng/mL (ref 11–307)

## 2017-12-08 LAB — TROPONIN I
Troponin I: 0.07 ng/mL (ref <0.03)
Troponin I: 0.07 ng/mL (ref <0.03)
Troponin I: 0.06 ng/mL (ref <0.03)

## 2017-12-08 LAB — RETICULOCYTES
RBC.: 4.9 MIL/uL (ref 3.87–5.11)
RETIC COUNT ABSOLUTE: 73.5 10*3/uL (ref 19.0–186.0)
RETIC CT PCT: 1.5 % (ref 0.4–3.1)

## 2017-12-08 LAB — AMMONIA: Ammonia: 20 umol/L (ref 9–35)

## 2017-12-08 LAB — IRON AND TIBC
Iron: 21 ug/dL — ABNORMAL LOW (ref 28–170)
SATURATION RATIOS: 5 % — AB (ref 10.4–31.8)
TIBC: 398 ug/dL (ref 250–450)
UIBC: 377 ug/dL

## 2017-12-08 LAB — ETHANOL: Alcohol, Ethyl (B): <10 mg/dL (ref <10)

## 2017-12-08 LAB — TSH: TSH: 1.9 u[IU]/mL (ref 0.350–4.500)

## 2017-12-08 LAB — PROTIME-INR
INR: 3.33
PROTHROMBIN TIME: 33.5 s — AB (ref 11.4–15.2)

## 2017-12-08 LAB — FOLATE: Folate: 9.7 ng/mL (ref >5.9)

## 2017-12-08 LAB — VITAMIN B12: VITAMIN B 12: 412 pg/mL (ref 180–914)

## 2017-12-08 MED ORDER — UMECLIDINIUM BROMIDE 62.5 MCG/INH IN AEPB
1.0000 | INHALATION_SPRAY | Freq: Every day | RESPIRATORY_TRACT | Status: DC
  Filled 2017-12-08: qty 7

## 2017-12-08 MED ORDER — ACETAMINOPHEN 160 MG/5ML PO SOLN
650.0000 mg | ORAL | Status: DC | PRN
  Administered 2017-12-11: 650 mg
  Filled 2017-12-08: qty 20.3

## 2017-12-08 MED ORDER — FLUTICASONE FUROATE-VILANTEROL 100-25 MCG/INH IN AEPB
1.0000 | INHALATION_SPRAY | Freq: Every day | RESPIRATORY_TRACT | Status: DC
  Administered 2017-12-11: 1 via RESPIRATORY_TRACT
  Filled 2017-12-08: qty 28

## 2017-12-08 MED ORDER — PANTOPRAZOLE SODIUM 40 MG PO TBEC
40.0000 mg | DELAYED_RELEASE_TABLET | Freq: Every day | ORAL | Status: DC
  Administered 2017-12-08 – 2017-12-11 (×4): 40 mg via ORAL
  Filled 2017-12-08 (×6): qty 1

## 2017-12-08 MED ORDER — POTASSIUM CHLORIDE CRYS ER 20 MEQ PO TBCR
20.0000 meq | EXTENDED_RELEASE_TABLET | Freq: Two times a day (BID) | ORAL | Status: DC
  Administered 2017-12-08 – 2017-12-13 (×10): 20 meq via ORAL
  Filled 2017-12-08 (×11): qty 1

## 2017-12-08 MED ORDER — PREDNISONE 5 MG PO TABS
30.0000 mg | ORAL_TABLET | Freq: Every day | ORAL | Status: AC
  Administered 2017-12-08: 30 mg via ORAL
  Filled 2017-12-08 (×2): qty 1

## 2017-12-08 MED ORDER — ACETAMINOPHEN 325 MG PO TABS
650.0000 mg | ORAL_TABLET | ORAL | Status: DC | PRN
  Administered 2017-12-10: 650 mg via ORAL
  Filled 2017-12-08 (×2): qty 2

## 2017-12-08 MED ORDER — CARVEDILOL 6.25 MG PO TABS
6.2500 mg | ORAL_TABLET | Freq: Two times a day (BID) | ORAL | Status: DC
  Administered 2017-12-08 – 2017-12-10 (×6): 6.25 mg via ORAL
  Filled 2017-12-08 (×6): qty 1

## 2017-12-08 MED ORDER — PREDNISONE 5 MG PO TABS
10.0000 mg | ORAL_TABLET | Freq: Every day | ORAL | Status: AC
  Administered 2017-12-10: 10 mg via ORAL
  Filled 2017-12-08: qty 2

## 2017-12-08 MED ORDER — FUROSEMIDE 40 MG PO TABS
40.0000 mg | ORAL_TABLET | Freq: Two times a day (BID) | ORAL | Status: DC
  Filled 2017-12-08: qty 1

## 2017-12-08 MED ORDER — LORAZEPAM 0.5 MG PO TABS
0.5000 mg | ORAL_TABLET | Freq: Three times a day (TID) | ORAL | Status: DC | PRN
  Administered 2017-12-09 – 2017-12-10 (×3): 0.5 mg via ORAL
  Filled 2017-12-08 (×3): qty 1

## 2017-12-08 MED ORDER — LORAZEPAM 2 MG/ML IJ SOLN
0.2500 mg | Freq: Once | INTRAMUSCULAR | Status: AC
  Administered 2017-12-08: 0.25 mg via INTRAVENOUS
  Filled 2017-12-08 (×2): qty 1

## 2017-12-08 MED ORDER — NITROGLYCERIN 0.4 MG SL SUBL: 0.4000 mg | SUBLINGUAL_TABLET | SUBLINGUAL | Status: DC | PRN

## 2017-12-08 MED ORDER — LORAZEPAM 2 MG/ML IJ SOLN: 0.5000 mg | Freq: Once | INTRAMUSCULAR | Status: DC

## 2017-12-08 MED ORDER — FLUTICASONE-UMECLIDIN-VILANT 100-62.5-25 MCG/INH IN AEPB: 1.0000 | INHALATION_SPRAY | Freq: Every day | RESPIRATORY_TRACT | Status: DC

## 2017-12-08 MED ORDER — ACETAMINOPHEN 650 MG RE SUPP: 650.0000 mg | RECTAL | Status: DC | PRN

## 2017-12-08 MED ORDER — FUROSEMIDE 10 MG/ML IJ SOLN
40.0000 mg | Freq: Two times a day (BID) | INTRAMUSCULAR | Status: DC
  Administered 2017-12-08 – 2017-12-10 (×5): 40 mg via INTRAVENOUS
  Filled 2017-12-08 (×6): qty 4

## 2017-12-08 MED ORDER — PREDNISONE 20 MG PO TABS
20.0000 mg | ORAL_TABLET | Freq: Every day | ORAL | Status: AC
  Administered 2017-12-09: 20 mg via ORAL
  Filled 2017-12-08 (×2): qty 1

## 2017-12-08 MED ORDER — HYDRALAZINE HCL 25 MG PO TABS
25.0000 mg | ORAL_TABLET | Freq: Three times a day (TID) | ORAL | Status: DC
  Administered 2017-12-08 – 2017-12-10 (×6): 25 mg via ORAL
  Filled 2017-12-08 (×7): qty 1

## 2017-12-08 MED ORDER — WARFARIN - PHARMACIST DOSING INPATIENT
Freq: Every day | Status: DC
  Administered 2017-12-09 – 2017-12-12 (×3)

## 2017-12-08 MED ORDER — ALBUTEROL SULFATE (2.5 MG/3ML) 0.083% IN NEBU
2.5000 mg | INHALATION_SOLUTION | Freq: Four times a day (QID) | RESPIRATORY_TRACT | Status: DC | PRN
  Administered 2017-12-11: 2.5 mg via RESPIRATORY_TRACT
  Filled 2017-12-08 (×2): qty 3

## 2017-12-08 MED ORDER — PREDNISONE 5 MG PO TABS: 10.0000 mg | ORAL_TABLET | Freq: Every day | ORAL | Status: DC

## 2017-12-08 NOTE — ED Notes (Signed)
[place] MD at bedside.  

## 2017-12-08 NOTE — Progress Notes (Signed)
Patient is a 62 year old female history of COPD, CHF, A. fib recently admitted and discharged for CHF exacerbation and pneumonia presented to the ED found laying on the floor.  Patient seen in the ED noted to be in A. fib with RVR started on a Cardizem drip.  Syncope work-up underway.  Patient just received IV Ativan prior to MRI brain.  Once more alert will transition of Cardizem drip.  BNP noted to be significantly elevated.  Patient with lower extremity edema.  Discontinue home regimen of oral diuretics.  Placed on Lasix 40 mg IV every 12 hours.  Strict I's and O's.  Daily weights.   No charge.

## 2017-12-08 NOTE — Progress Notes (Signed)
SLP Cancellation Note  Patient Details Name: Glenda Johnson MRN: [id number] DOB: 29-Mar-1956   Cancelled treatment:       Reason Eval/Treat Not Completed: Fatigue/lethargy limiting ability to participate. Attempted evalution; pt lethargic, responding only intermittently to questions. Will reattempt when more alert.   Deneise Lever, [place], [place] Speech-Language Pathologist [place] 12/08/2017, 3:30 PM

## 2017-12-08 NOTE — Progress Notes (Signed)
OT Cancellation Note  Patient Details Name: Lilah Mijangos MRN: [id number] DOB: 10-15-1955   Cancelled Treatment:    Reason Eval/Treat Not Completed: Fatigue/lethargy limiting ability to participate. Will reattempt.  [place], OTR/L [phone]   Lucille Passy M 12/08/2017, 2:07 PM

## 2017-12-08 NOTE — Progress Notes (Addendum)
PT Cancellation Note  Patient Details Name: Glenda Johnson MRN: [id number] DOB: [birth year]   Cancelled Treatment:    Reason Eval/Treat Not Completed: Patient's level of consciousness.  Pt given sedative for MRI and PT unable to adequately arouse right now (even in sitting EOB).    Thanks,    Barbarann Ehlers. Rilda Bulls, PT, DPT [phone]   12/08/2017, 10:33 AM    12/08/2017 12:36- checked on pt again and about to be set up for EEG.  PT to check back tomorrow.   Thanks,  Barbarann Ehlers. [place], [place], DPT [phone]

## 2017-12-08 NOTE — Evaluation (Signed)
Physical Therapy Evaluation Patient Details Name: Glenda Johnson MRN: [id number] DOB: 1955-09-16 Today's Date: 12/08/2017   History of Present Illness  62 y.o. female admitted on 12/07/17 for confusion.  Pt found to be lying in the floor at home by her sister.  CT of head with no acute events, MRI is negative for acute events.  Pt found to be in A-fib with RVR.  Pt with other significant PMH of recent stroke (March), HTN, Hepatitis C, COPD, CHF, A-fib, and back surgery.    Clinical Impression  Limited evaluation as PT assisted pt to EOB and she became very irritable, yelling for PT to stop touching her and leave her alone, yet she would not respond in supine (except to look at me through a partially open one eye).  She refused to stand or get up OOB.  I attempted to explain (which I did in supine without response) that therapy is here to get her moving so that she could get back home.  She became increasingly irritated and continued to yell, so therapist repositioned her in supine with bed alarm set.  RN overheard interaction.  In her recent last admission, she went home and home therapy was recommended.  It sounds like it is a struggle at home alone, so depending on progress SNF may also need to be considered.  PT to follow acutely until d/c confirmed.       Follow Up Recommendations Home health PT;Supervision/Assistance - 24 hour    Equipment Recommendations  None recommended by PT    Recommendations for Other Services   NA    Precautions / Restrictions Precautions Precautions: Fall      Mobility  Bed Mobility Overal bed mobility: Needs Assistance Bed Mobility: Supine to Sit;Sit to Supine     Supine to sit: Max assist;HOB elevated Sit to supine: Mod assist;HOB elevated   General bed mobility comments: Max assist to sit EOB as pt kep opening one eye to look at me, but not responding to my inquiries to sit up.  Mod assist to help her lift both legs back into the bed.   Transfers                  General transfer comment: NT due to pt refusal  Ambulation/Gait             General Gait Details: NT due to pt refusal.    Modified Rankin (Stroke Patients Only) Modified Rankin (Stroke Patients Only) Pre-Morbid Rankin Score: Moderate disability Modified Rankin: Severe disability     Balance Overall balance assessment: Needs assistance Sitting-balance support: Feet supported;Bilateral upper extremity supported Sitting balance-Leahy Scale: Fair Sitting balance - Comments: min guard to close supervision EOB.  Eyes closed, pt, once sitting yelling at therapist to "stop touching me!  Get out of here and leave me alone!" Therapist calmly tried to explain the purpose of therapy (which I attempted to do prior to sitting pt up, but she would not respond to me) and that we want to try to keep her independence and help her get better so she can go home and pt continued to scream at therapist, "Leave me alone! get out of here!", so I assisted pt back to supine.  RN aware.                                       Pertinent Vitals/Pain Pain Assessment: Faces Faces  Pain Scale: No hurt    Home Living Family/patient expects to be discharged to:: Unsure Living Arrangements: Alone               Additional Comments: Pt is unable and unwilling at this time to speak with PT.  She answered some questions and quickly got irritated with PT.     Prior Function           Comments: unsrue     Hand Dominance   Dominant Hand: Right    Extremity/Trunk Assessment   Upper Extremity Assessment Upper Extremity Assessment: Defer to OT evaluation    Lower Extremity Assessment Lower Extremity Assessment: Generalized weakness(difficult to assess due to participation level)    Cervical / Trunk Assessment Cervical / Trunk Assessment: Kyphotic(mildly kyphotic)  Communication   Communication: No difficulties  Cognition Arousal/Alertness: Lethargic(pt kept  eyes closed throughout the session. ) Behavior During Therapy: Agitated Overall Cognitive Status: Difficult to assess                                 General Comments: difficult to assess due to unwillingness to talk with PT an increased irritability with questions.  She was able to tell me she was at [facility], but unable to tell me why (even after I reviewed this with her earlier in the session).               Assessment/Plan    PT Assessment Patient needs continued PT services  PT Problem List Decreased strength;Decreased activity tolerance;Decreased balance;Decreased mobility;Decreased cognition;Decreased knowledge of use of DME;Decreased safety awareness;Decreased knowledge of precautions       PT Treatment Interventions DME instruction;Gait training;Stair training;Functional mobility training;Therapeutic activities;Therapeutic exercise;Balance training;Cognitive remediation;Patient/family education    PT Goals (Current goals can be found in the Care Plan section)  Acute Rehab PT Goals Patient Stated Goal: none stated, other than for PT to leave the room.  PT Goal Formulation: Patient unable to participate in goal setting Time For Goal Achievement: 12/22/17 Potential to Achieve Goals: Fair    Frequency Min 3X/week   Barriers to discharge Decreased caregiver support lives alone per chart       AM-PAC PT "6 Clicks" Daily Activity  Outcome Measure Difficulty turning over in bed (including adjusting bedclothes, sheets and blankets)?: Unable Difficulty moving from lying on back to sitting on the side of the bed? : Unable Difficulty sitting down on and standing up from a chair with arms (e.g., wheelchair, bedside commode, etc,.)?: Unable Help needed moving to and from a bed to chair (including a wheelchair)?: Total Help needed walking in hospital room?: Total Help needed climbing 3-5 steps with a railing? : Total 6 Click Score: 6    End of Session  Equipment Utilized During Treatment: Oxygen Activity Tolerance: Treatment limited secondary to agitation Patient left: in bed;with call bell/phone within reach;with bed alarm set Nurse Communication: Mobility status PT Visit Diagnosis: Difficulty in walking, not elsewhere classified (R26.2);Other symptoms and signs involving the nervous system (R29.898);History of falling (Z91.81)    Time: 4627-0350 PT Time Calculation (min) (ACUTE ONLY): 15 min   Charges:         Wells Guiles B. Donterrius Santucci, PT, DPT [phone]   PT Evaluation $PT Eval Moderate Complexity: 1 Mod     12/08/2017, 6:24 PM

## 2017-12-08 NOTE — Progress Notes (Signed)
ANTICOAGULATION CONSULT NOTE - Initial Consult  Pharmacy Consult for coumadin Indication: atrial fibrillation  Allergies  Allergen Reactions  . Codeine Anaphylaxis  . Tramadol Anaphylaxis    No hydrocodone or anything connected No hydrocodone or anything connected   . Clarithromycin Nausea And Vomiting  . Citalopram Other (See Comments)    Caused more depression   . Fish Allergy Itching    ears  . Other     Patient says that all narcotics give her anaphylaxis.   . Wellbutrin [Bupropion] Other (See Comments)    "could not speak a full sentence for a whole year"    Patient Measurements: Height: 5\' 7"  (170.2 cm) Weight: 125 lb 7.1 oz (56.9 kg) IBW/kg (Calculated) : 61.6   Vital Signs: Temp: 97.7 F (36.5 C) (06/15 0345) Temp Source: Oral (06/15 0345) BP: 141/73 (06/15 0345) Pulse Rate: 119 (06/15 0345)  Labs: Recent Labs    12/07/17 2030 12/07/17 2050  HGB 11.0* 13.6  HCT 41.6 40.0  PLT 443*  --   LABPROT 36.2*  --   INR 3.67  --   CREATININE 1.49* 1.20*  CKTOTAL 47  --   TROPONINI 0.07*  --     Estimated Creatinine Clearance: 43.1 mL/min (A) (by C-G formula based on SCr of 1.2 mg/dL (H)).   Medical History: Past Medical History:  Diagnosis Date  . Atrial fibrillation ([place])   . CHF (congestive heart failure) ([place])   . COPD (chronic obstructive pulmonary disease) ([place])   . Hepatitis C   . Pulmonary thrombosis ([place])   . Renal artery stenosis ([place])   . Secondary hypertension   . Stroke ([place])   . Tobacco abuse     Medications:  Medications Prior to Admission  Medication Sig Dispense Refill Last Dose  . albuterol (PROVENTIL HFA;VENTOLIN HFA) 108 (90 Base) MCG/ACT inhaler Inhale 2 puffs into the lungs every 4 (four) hours as needed.   Past Week at Unknown time  . albuterol (PROVENTIL) (2.5 MG/3ML) 0.083% nebulizer solution Take 3 mLs (2.5 mg total) by nebulization every 6 (six) hours as needed for wheezing. 75 mL 12 Past Week at Unknown time  .  carvedilol (COREG) 12.5 MG tablet Take 1 tablet (12.5 mg total) by mouth 2 (two) times daily with a meal.   unknown  . Fluticasone-Umeclidin-Vilant (TRELEGY ELLIPTA) 100-62.5-25 MCG/INH AEPB Inhale 1 puff into the lungs daily.    Past Week at Unknown time  . furosemide (LASIX) 40 MG tablet Take 1 tablet (40 mg total) by mouth 2 (two) times daily. 60 tablet 0 Past Week at Unknown time  . hydrALAZINE (APRESOLINE) 25 MG tablet Take 1 tablet (25 mg total) by mouth 3 (three) times daily. 90 tablet 0 Past Week at Unknown time  . LORazepam (ATIVAN) 0.5 MG tablet Take 0.5 mg by mouth every 8 (eight) hours as needed for anxiety.    Past Week at Unknown time  . nitroGLYCERIN (NITROSTAT) 0.4 MG SL tablet Place 0.4 mg under the tongue every 5 (five) minutes as needed for chest pain.   unknown  . pantoprazole (PROTONIX) 40 MG tablet Take 1 tablet (40 mg total) by mouth daily at 12 noon.   Past Week at Unknown time  . potassium chloride SA (K-DUR,KLOR-CON) 20 MEQ tablet Take 2 tablets (40 mEq total) by mouth daily. (Patient taking differently: Take 20 mEq by mouth 2 (two) times daily. ) 60 tablet 0 Past Week at Unknown time  . predniSONE (DELTASONE) 10 MG tablet Take 1 tablet (  10 mg total) by mouth daily with breakfast. Take 6 tablets today and then decrease by 1 tablet daily until none are left. 21 tablet 0   . warfarin (COUMADIN) 5 MG tablet Take 1 tablet (5 mg total) by mouth daily. 10 tablet 0 Past Week at Unknown time    Assessment: 62 yo lady to continue coumadin for afib.  INR 3.67.  Unknown last dose of coumadin Goal of Therapy:  INR 2-3 Monitor platelets by anticoagulation protocol: Yes   Plan:  No coumadin today. Daily PT/INR  Excell Seltzer Poteet 12/08/2017,4:21 AM

## 2017-12-08 NOTE — Procedures (Signed)
  ELECTROENCEPHALOGRAM REPORT  Date of Study: 12/08/17  Patient's Name: Glenda Johnson MRN: [id number] Date of Birth: Aug 10, 1955  Referring Provider: Gean Birchwood, MD  Clinical History: [name] is a 62 y.o. female with history of CHF, COPD, atrial fibrillation, stroke who was recently admitted to antepartum hospital and discharged after being treated for CHF and pneumonia 3 days ago was brought to the ER after patient was found to be lying on the floor.  On exam patient was initially oriented to name and place but does not recall anything what happened last 3 days. Head MRI (6/15): No acute abnormality. Chronic infarct right occipital parietal lobe and mild chronic microvascular ischemia.   Medications: Scheduled Meds: . carvedilol  6.25 mg Oral BID WC  . fluticasone furoate-vilanterol  1 puff Inhalation Daily   And  . umeclidinium bromide  1 puff Inhalation Daily  . furosemide  40 mg Intravenous BID  . hydrALAZINE  25 mg Oral Q8H  . pantoprazole  40 mg Oral Q1200  . potassium chloride SA  20 mEq Oral BID  . [START ON 12/09/2017] predniSONE  20 mg Oral Q breakfast   Followed by  . [START ON 12/10/2017] predniSONE  10 mg Oral Q breakfast  . Warfarin - Pharmacist Dosing Inpatient   Does not apply q1800   Continuous Infusions: . diltiazem (CARDIZEM) infusion Stopped (12/08/17 1351)   PRN Meds:.acetaminophen **OR** acetaminophen (TYLENOL) oral liquid 160 mg/5 mL **OR** acetaminophen, albuterol, LORazepam, nitroGLYCERIN            Technical Summary: This is a standard 16 channel EEG recording performed according to the international 10-20 electrode system.  AP bipolar, transverse bipolar, and referential montages were obtained, and digitally reformatted as necessary.  Duration of tracing: 23:46  Description: Pt is noted to be very confused (appears asleep). A 6 Hz theta rhythm is seen posteriorly with superimposed, generalized beta (13 Hz).  There is no well defined  drowsiness or stage 2 sleep identified.  At approximately 10 minutes into the recording there is a rapid increase in the amplitude of 20Hz  fast activity seen over the bifrontotemporal head regions, lasting about 34min 30 sec, without any clinical correlate on video (pt appears to remain asleep).  Neither HV or photic were performed.   EKG was monitored and noted to be irregular (? AFib) at 108 bpm with occasional PVC's.  No definite epileptiform changes were noted.  Impression: This is an abnormal EEG due to background slowing seen throughout the tracing.  This is a non-specific finding that can be seen with toxic, metabolic, diffuse, or multifocal structural processes.  Additionally, there was at least one episode of paroxysmal, bifrontotemporal buildup of fast activity, without clinical correlate.  Although not frankly epileptiform, the abrupt background change might signify an underlying propensity towards seizures.  Given the noted clinical history, a long term video EEG might be useful for further evaluation, if clinically indicated.  A single EEG without epileptiform changes does not exclude the diagnosis of epilepsy. Clinical correlation advised.   Carvel Getting, M.D. Neurology Cell [phone]

## 2017-12-08 NOTE — H&P (Signed)
History and Physical    Glenda Johnson YWV:[id number] DOB: Jan 27, 1956 DOA: 12/07/2017  PCP: [name], MD  Patient coming from: Home.  Chief Complaint: Confusion.  HPI: Glenda Johnson is a 62 y.o. female with history of CHF, COPD, atrial fibrillation, stroke who was recently admitted to antepartum hospital and discharged after being treated for CHF and pneumonia 3 days ago was brought to the ER after patient was found to be lying on the floor.  Patient was found to be on the floor by patient's sister and was brought to the ER.  Patient does not recall what has happened over the last few days since discharge on December 05, 2017 3 days ago.  Denies any headache chest pain shortness of breath weakness of the extremities.  Reviewing patient's recent discharge note patient has been mildly delirious during the stay at the hospital.  Patient had a stroke in March and since then patient also was found to be confused as per the note.  On my exam patient is oriented to name and place but does not recall anything what happened last 3 days.  ED Course: CT head is unremarkable.  Chest x-ray and UA unremarkable.  Patient is afebrile.  Patient was in A. fib with RVR for which patient started on Cardizem infusion.  Coreg 1 dose was given.  Patient is moving all extremities.  MRI brain is pending.  Review of Systems: As per HPI, rest all negative.   Past Medical History:  Diagnosis Date  . Atrial fibrillation ([place])   . CHF (congestive heart failure) ([place])   . COPD (chronic obstructive pulmonary disease) ([place])   . Hepatitis C   . Pulmonary thrombosis ([place])   . Renal artery stenosis ([place])   . Secondary hypertension   . Stroke ([place])   . Tobacco abuse     Past Surgical History:  Procedure Laterality Date  . BACK SURGERY    . CARDIAC CATHETERIZATION N/A 05/25/2016   Procedure: Left Heart Cath and Coronary Angiography;  Surgeon: Lorretta Harp, MD;  Location: [facility];  Service:  Cardiovascular;  Laterality: N/A;  . PLACEMENT OF BREAST IMPLANTS Bilateral   . stent renal artery       reports that she has been smoking cigarettes.  She has never used smokeless tobacco. She reports that she does not drink alcohol or use drugs.  Allergies  Allergen Reactions  . Codeine Anaphylaxis  . Tramadol Anaphylaxis    No hydrocodone or anything connected No hydrocodone or anything connected   . Clarithromycin Nausea And Vomiting  . Citalopram Other (See Comments)    Caused more depression   . Fish Allergy Itching    ears  . Other     Patient says that all narcotics give her anaphylaxis.   . Wellbutrin [Bupropion] Other (See Comments)    "could not speak a full sentence for a whole year"    Family History  Problem Relation Age of Onset  . Diabetes Maternal Grandmother     Prior to Admission medications   Medication Sig Start Date End Date Taking? Authorizing Provider  albuterol (PROVENTIL HFA;VENTOLIN HFA) 108 (90 Base) MCG/ACT inhaler Inhale 2 puffs into the lungs every 4 (four) hours as needed. 09/10/17 09/10/18  [provider]  albuterol (PROVENTIL) (2.5 MG/3ML) 0.083% nebulizer solution Take 3 mLs (2.5 mg total) by nebulization every 6 (six) hours as needed for wheezing. 11/05/17   Kathie Dike, MD  carvedilol (COREG) 12.5 MG tablet Take 1  tablet (12.5 mg total) by mouth 2 (two) times daily with a meal. 09/24/17   Velvet Bathe, MD  Fluticasone-Umeclidin-Vilant (TRELEGY ELLIPTA) 100-62.5-25 MCG/INH AEPB Inhale 1 puff into the lungs daily.  10/30/17   [provider]  furosemide (LASIX) 40 MG tablet Take 1 tablet (40 mg total) by mouth 2 (two) times daily. 11/05/17 12/05/17  Kathie Dike, MD  hydrALAZINE (APRESOLINE) 25 MG tablet Take 1 tablet (25 mg total) by mouth 3 (three) times daily. 11/05/17   Kathie Dike, MD  LORazepam (ATIVAN) 0.5 MG tablet Take 0.5 mg by mouth every 8 (eight) hours as needed for anxiety.     [provider]    nitroGLYCERIN (NITROSTAT) 0.4 MG SL tablet Place 0.4 mg under the tongue every 5 (five) minutes as needed for chest pain.    [provider]  pantoprazole (PROTONIX) 40 MG tablet Take 1 tablet (40 mg total) by mouth daily at 12 noon. 09/25/17   Velvet Bathe, MD  potassium chloride SA (K-DUR,KLOR-CON) 20 MEQ tablet Take 2 tablets (40 mEq total) by mouth daily. Patient taking differently: Take 20 mEq by mouth 2 (two) times daily.  10/10/17 12/03/17  Johnson, Clanford L, MD  predniSONE (DELTASONE) 10 MG tablet Take 1 tablet (10 mg total) by mouth daily with breakfast. Take 6 tablets today and then decrease by 1 tablet daily until none are left. 12/05/17   Isaac Bliss, Rayford Halsted, MD  warfarin (COUMADIN) 5 MG tablet Take 1 tablet (5 mg total) by mouth daily. 09/24/17   Velvet Bathe, MD    Physical Exam: Vitals:   12/08/17 0122 12/08/17 0130 12/08/17 0200 12/08/17 0345  BP:  (!) 126/58 132/68 (!) 141/73  Pulse:  71 71 (!) 119  Resp:  17 18 18   Temp: 98.5 F (36.9 C)  97.6 F (36.4 C) 97.7 F (36.5 C)  TempSrc:   Oral Oral  SpO2:  99% 94% 94%  Weight:   56.9 kg (125 lb 7.1 oz)   Height:   5\' 7"  (1.702 m)       Constitutional: Moderately built and nourished. Vitals:   12/08/17 0122 12/08/17 0130 12/08/17 0200 12/08/17 0345  BP:  (!) 126/58 132/68 (!) 141/73  Pulse:  71 71 (!) 119  Resp:  17 18 18   Temp: 98.5 F (36.9 C)  97.6 F (36.4 C) 97.7 F (36.5 C)  TempSrc:   Oral Oral  SpO2:  99% 94% 94%  Weight:   56.9 kg (125 lb 7.1 oz)   Height:   5\' 7"  (1.702 m)    Eyes: Anicteric no pallor. ENMT: No discharge from the ears eyes nose or mouth. Neck: No mass palpated no neck rigidity.  No JVD appreciated. Respiratory: No rhonchi or crepitations. Cardiovascular: S1-S2 heard no murmurs appreciated. Abdomen: Soft nontender bowel sounds present. Musculoskeletal: No edema.  No joint effusion. Skin: No rash.  Skin appears warm. Neurologic: Alert awake oriented to name and place  moves all extremities. Psychiatric: Appears confused.   Labs on Admission: I have personally reviewed following labs and imaging studies  CBC: Recent Labs  Lab 12/02/17 0502 12/07/17 2030 12/07/17 2050  WBC 8.3 12.9*  --   NEUTROABS 7.0 10.2*  --   HGB 9.4* 11.0* 13.6  HCT 32.8* 41.6 40.0  MCV 76.5* 77.6*  --   PLT 297 443*  --    Basic Metabolic Panel: Recent Labs  Lab 12/01/17 0758 12/02/17 0502 12/03/17 0453 12/04/17 0503 12/05/17 0409 12/07/17 2030 12/07/17 2050  NA 135 137 140 142 140 141 140  K 3.6 3.5 3.6 3.1* 3.6 4.1 4.2  CL 104 105 103 104 102 101 101  CO2 20* 21* 26 26 29 24   --   GLUCOSE 148* 161* 164* 175* 152* 97 100*  BUN 14 21* 24* 28* 30* 30* 38*  CREATININE 1.29* 1.24* 1.23* 1.13* 1.06* 1.49* 1.20*  CALCIUM 8.5* 8.8* 9.0 8.9 9.0 9.0  --   MG 2.1 1.9 2.0 1.9  --   --   --    GFR: Estimated Creatinine Clearance: 43.1 mL/min (A) (by C-G formula based on SCr of 1.2 mg/dL (H)). Liver Function Tests: Recent Labs  Lab 12/07/17 2030  AST 26  ALT 17  ALKPHOS 55  BILITOT 2.6*  PROT 6.4*  ALBUMIN 3.3*   No results for input(s): LIPASE, AMYLASE in the last 168 hours. No results for input(s): AMMONIA in the last 168 hours. Coagulation Profile: Recent Labs  Lab 12/02/17 0502 12/03/17 0453 12/04/17 0503 12/05/17 0409 12/07/17 2030  INR 2.77 2.90 3.26 3.64 3.67   Cardiac Enzymes: Recent Labs  Lab 12/01/17 0758 12/01/17 1445 12/07/17 2030  CKTOTAL  --   --  47  TROPONINI 0.15* 0.11* 0.07*   BNP (last 3 results) No results for input(s): PROBNP in the last 8760 hours. HbA1C: No results for input(s): HGBA1C in the last 72 hours. CBG: Recent Labs  Lab 12/07/17 1938  GLUCAP 107*   Lipid Profile: No results for input(s): CHOL, HDL, LDLCALC, TRIG, CHOLHDL, LDLDIRECT in the last 72 hours. Thyroid Function Tests: No results for input(s): TSH, T4TOTAL, FREET4, T3FREE, THYROIDAB in the last 72 hours. Anemia Panel: No results for input(s):  VITAMINB12, FOLATE, FERRITIN, TIBC, IRON, RETICCTPCT in the last 72 hours. Urine analysis:    Component Value Date/Time   COLORURINE AMBER (A) 12/07/2017 2208   APPEARANCEUR CLEAR 12/07/2017 2208   LABSPEC 1.019 12/07/2017 2208   PHURINE 5.0 12/07/2017 2208   GLUCOSEU NEGATIVE 12/07/2017 2208   HGBUR SMALL (A) 12/07/2017 2208   BILIRUBINUR NEGATIVE 12/07/2017 [place] 12/07/2017 2208   PROTEINUR 100 (A) 12/07/2017 2208   NITRITE NEGATIVE 12/07/2017 2208   LEUKOCYTESUR NEGATIVE 12/07/2017 2208   Sepsis Labs: @LABRCNTIP (procalcitonin:4,lacticidven:4) ) Recent Results (from the past 240 hour(s))  Blood Culture (routine x 2)     Status: None   Collection Time: 11/30/17 11:50 PM  Result Value Ref Range Status   Specimen Description LEFT ANTECUBITAL  Final   Special Requests   Final    BOTTLES DRAWN AEROBIC AND ANAEROBIC Blood Culture adequate volume   Culture   Final    NO GROWTH 5 DAYS Performed at [facility], [address] [place], [place] [address]    Report Status 12/06/2017 FINAL  Final  Blood Culture (routine x 2)     Status: None   Collection Time: 12/01/17 12:03 AM  Result Value Ref Range Status   Specimen Description BLOOD RIGHT FOREARM  Final   Special Requests   Final    BOTTLES DRAWN AEROBIC AND ANAEROBIC Blood Culture adequate volume   Culture   Final    NO GROWTH 5 DAYS Performed at [facility], [address], [place], [place] [address]    Report Status 12/06/2017 FINAL  Final  Urine culture     Status: None   Collection Time: 12/01/17 12:06 AM  Result Value Ref Range Status   Specimen Description   Final    URINE, CLEAN CATCH Performed at [facility], [address], [place], [place] [address]    Special Requests   Final    NONE Performed at [facility], [address], [place], [place] [address]    Culture   Final    NO GROWTH Performed at [facility], [place] [address] [place], [place] [address]    Report Status 12/02/2017  FINAL  Final  MRSA PCR Screening     Status: Abnormal   Collection Time: 12/01/17  4:04 AM  Result Value Ref Range Status   MRSA by PCR POSITIVE (A) NEGATIVE Final    Comment: RESULT CALLED TO, READ BACK BY AND VERIFIED WITH: [email or web address] @ 1600 ON 6.8.19 BY L.BOWMAN        The GeneXpert MRSA Assay (FDA approved for NASAL specimens only), is one component of a comprehensive MRSA colonization surveillance program. It is not intended to diagnose MRSA infection nor to guide or monitor treatment for MRSA infections. Performed at [facility], [address], [place], [place] [address]   Respiratory Panel by PCR     Status: None   Collection Time: 12/01/17 12:30 PM  Result Value Ref Range Status   Adenovirus NOT DETECTED NOT DETECTED Final   Coronavirus 229E NOT DETECTED NOT DETECTED Final   Coronavirus HKU1 NOT DETECTED NOT DETECTED Final   Coronavirus NL63 NOT DETECTED NOT DETECTED Final   Coronavirus OC43 NOT DETECTED NOT DETECTED Final   Metapneumovirus NOT DETECTED NOT DETECTED Final   Rhinovirus / Enterovirus NOT DETECTED NOT DETECTED Final   Influenza A NOT DETECTED NOT DETECTED Final   Influenza B NOT DETECTED NOT DETECTED Final   Parainfluenza Virus 1 NOT DETECTED NOT DETECTED Final   Parainfluenza Virus 2 NOT DETECTED NOT DETECTED Final   Parainfluenza Virus 3 NOT DETECTED NOT DETECTED Final   Parainfluenza Virus 4 NOT DETECTED NOT DETECTED Final   Respiratory Syncytial Virus NOT DETECTED NOT DETECTED Final   Bordetella pertussis NOT DETECTED NOT DETECTED Final   Chlamydophila pneumoniae NOT DETECTED NOT DETECTED Final   Mycoplasma pneumoniae NOT DETECTED NOT DETECTED Final    Comment: Performed at [facility], [place] [address], [place], [place] [address]     Radiological Exams on Admission: Dg Chest 2 View  Result Date: 12/07/2017 CLINICAL DATA:  Syncope.  History of chronic heart failure and COPD. EXAM: CHEST - 2 VIEW COMPARISON:  Single-view of the chest  12/01/2017 11/01/2017. FINDINGS: Small to moderate bilateral pleural effusions and basilar atelectasis appear somewhat improved compared to the most recent examination. Cardiac silhouette is largely obscured. No evidence of pulmonary edema. Aortic atherosclerosis is noted. No acute bony abnormality. IMPRESSION: Small to moderate bilateral pleural effusions and basilar atelectasis appear improved since the most recent examination. Atherosclerosis. Electronically Signed   By: Inge Rise M.D.   On: 12/07/2017 19:29   Ct Head Wo Contrast  Result Date: 12/07/2017 CLINICAL DATA:  Patient found down at 4:30 p.m. today. EXAM: CT HEAD WITHOUT CONTRAST TECHNIQUE: Contiguous axial images were obtained from the base of the skull through the vertex without intravenous contrast. COMPARISON:  Head CT 09/20/2017.  Brain MRI 09/19/2017. FINDINGS: Brain: No evidence of acute infarction, hemorrhage, hydrocephalus, extra-axial collection or mass lesion/mass effect. Atrophy, chronic microvascular ischemic change and remote right occipital infarct are identified. Additional smaller infarcts seen on MRI are difficult to appreciate on this exam. Vascular: No hyperdense vessel or unexpected calcification. Skull: Intact. Sinuses/Orbits: Negative. Other: None. IMPRESSION: No acute abnormality. Atrophy, chronic microvascular ischemic change and remote  infarcts. Electronically Signed   By: Inge Rise M.D.   On: 12/07/2017 19:53   Dg Foot Complete Right  Result Date: 12/07/2017 CLINICAL DATA:  Right foot pain.  No known injury. EXAM: RIGHT FOOT COMPLETE - 3+ VIEW COMPARISON:  None. FINDINGS: No acute bony or joint abnormality is seen. Mild first MTP osteoarthritis is noted. Large calcaneal spur at the Achilles tendon insertion is noted. The patient also has a small plantar calcaneal spur. IMPRESSION: No acute abnormality. Mild first MTP osteoarthritis. Calcaneal spurring. Electronically Signed   By: Inge Rise M.D.    On: 12/07/2017 19:30    EKG: Independently reviewed.  A. fib with RVR.  Assessment/Plan Principal Problem:   Syncope Active Problems:   COPD (chronic obstructive pulmonary disease) (HCC)   CKD (chronic kidney disease) stage 3, GFR 30-59 ml/min (HCC)   Atrial fibrillation with RVR (HCC)   Acute on chronic diastolic congestive heart failure ([place])    1. Syncope and acute encephalopathy/delirium -cause not clear.  Looks like patient was mildly delirious during recent admission last week and also was confused since her last stroke 2 months ago.  Will check MRI brain ammonia level TSH RPR B12 and folate levels.  Check EEG.  Patient is afebrile at this time. 2. A. fib with RVR patient is placed on Cardizem infusion.  Patient takes Coreg and once patient takes Coreg in the morning we will try to wean off Cardizem.  Chads 2 vasc score is around 4.  On Coumadin. 3. Hypertension on Coreg hydralazine. 4. Chronic diastolic CHF last EF measured last week was 50 to 55% with grade 2 diastolic dysfunction -chest x-ray shows improving pleural effusion.  Continue Lasix. 5. COPD not actively wheezing. 6. Microcytic hypochromic anemia -follow CBC. 7. History of stroke -follow MRI brain.   DVT prophylaxis: Coumadin. Code Status: Full code. Family Communication: No family at the bedside. Disposition Plan: To be determined. Consults called: None. Admission status: Observation.   Rise Patience MD [facility] Pager [phone].  If 7PM-7AM, please contact night-coverage [email or web address] Password [email or web address]  12/08/2017, 4:14 AM

## 2017-12-08 NOTE — Progress Notes (Addendum)
Received patient from ED, oriented to room, personal items and call light in reach, placed on 2L/min [place], skin  assessments completed, placed on MRSA isolation

## 2017-12-08 NOTE — Progress Notes (Signed)
EEG complete - results pending 

## 2017-12-09 DIAGNOSIS — N183 Chronic kidney disease, stage 3 (moderate): Secondary | ICD-10-CM | POA: Diagnosis not present

## 2017-12-09 DIAGNOSIS — I6340 Cerebral infarction due to embolism of unspecified cerebral artery: Secondary | ICD-10-CM

## 2017-12-09 DIAGNOSIS — I4891 Unspecified atrial fibrillation: Secondary | ICD-10-CM | POA: Diagnosis not present

## 2017-12-09 DIAGNOSIS — R569 Unspecified convulsions: Secondary | ICD-10-CM | POA: Diagnosis not present

## 2017-12-09 DIAGNOSIS — I5033 Acute on chronic diastolic (congestive) heart failure: Secondary | ICD-10-CM | POA: Diagnosis not present

## 2017-12-09 DIAGNOSIS — E876 Hypokalemia: Secondary | ICD-10-CM | POA: Diagnosis not present

## 2017-12-09 LAB — BASIC METABOLIC PANEL
Anion gap: 13 (ref 5–15)
BUN: 24 mg/dL — ABNORMAL HIGH (ref 6–20)
CALCIUM: 8.5 mg/dL — AB (ref 8.9–10.3)
CO2: 26 mmol/L (ref 22–32)
CREATININE: 1.27 mg/dL — AB (ref 0.44–1.00)
Chloride: 98 mmol/L — ABNORMAL LOW (ref 101–111)
GFR calc Af Amer: 51 mL/min — ABNORMAL LOW (ref >60)
GFR calc non Af Amer: 44 mL/min — ABNORMAL LOW (ref >60)
GLUCOSE: 255 mg/dL — AB (ref 65–99)
Potassium: 3.1 mmol/L — ABNORMAL LOW (ref 3.5–5.1)
Sodium: 137 mmol/L (ref 135–145)

## 2017-12-09 LAB — CBC WITH DIFFERENTIAL/PLATELET
BASOS ABS: 0 10*3/uL (ref 0.0–0.1)
Basophils Relative: 0 %
EOS PCT: 0 %
Eosinophils Absolute: 0 10*3/uL (ref 0.0–0.7)
HEMATOCRIT: 37.6 % (ref 36.0–46.0)
Hemoglobin: 10.4 g/dL — ABNORMAL LOW (ref 12.0–15.0)
LYMPHS ABS: 1.4 10*3/uL (ref 0.7–4.0)
Lymphocytes Relative: 8 %
MCH: 20.9 pg — ABNORMAL LOW (ref 26.0–34.0)
MCHC: 27.7 g/dL — AB (ref 30.0–36.0)
MCV: 75.5 fL — AB (ref 78.0–100.0)
MONOS PCT: 8 %
Monocytes Absolute: 1.4 10*3/uL — ABNORMAL HIGH (ref 0.1–1.0)
NEUTROS ABS: 14.2 10*3/uL — AB (ref 1.7–7.7)
Neutrophils Relative %: 84 %
Platelets: 442 10*3/uL — ABNORMAL HIGH (ref 150–400)
RBC: 4.98 MIL/uL (ref 3.87–5.11)
RDW: 21.3 % — AB (ref 11.5–15.5)
WBC: 17 10*3/uL — ABNORMAL HIGH (ref 4.0–10.5)

## 2017-12-09 LAB — PROTIME-INR
INR: 2.32
Prothrombin Time: 25.3 seconds — ABNORMAL HIGH (ref 11.4–15.2)

## 2017-12-09 LAB — PHOSPHORUS: Phosphorus: 2.6 mg/dL (ref 2.5–4.6)

## 2017-12-09 LAB — MAGNESIUM: Magnesium: 1.7 mg/dL (ref 1.7–2.4)

## 2017-12-09 LAB — RPR: RPR: NONREACTIVE

## 2017-12-09 MED ORDER — LEVETIRACETAM 500 MG PO TABS
500.0000 mg | ORAL_TABLET | Freq: Two times a day (BID) | ORAL | Status: DC
  Administered 2017-12-09 – 2017-12-13 (×9): 500 mg via ORAL
  Filled 2017-12-09 (×9): qty 1

## 2017-12-09 MED ORDER — WARFARIN SODIUM 5 MG PO TABS
5.0000 mg | ORAL_TABLET | Freq: Once | ORAL | Status: AC
  Administered 2017-12-09: 5 mg via ORAL
  Filled 2017-12-09: qty 1

## 2017-12-09 MED ORDER — POTASSIUM CHLORIDE CRYS ER 20 MEQ PO TBCR
40.0000 meq | EXTENDED_RELEASE_TABLET | Freq: Once | ORAL | Status: AC
  Administered 2017-12-09: 40 meq via ORAL
  Filled 2017-12-09: qty 2

## 2017-12-09 MED ORDER — MAGNESIUM SULFATE 4 GM/100ML IV SOLN
4.0000 g | Freq: Once | INTRAVENOUS | Status: AC
  Administered 2017-12-09: 4 g via INTRAVENOUS
  Filled 2017-12-09: qty 100

## 2017-12-09 NOTE — Progress Notes (Signed)
Pt requests consult with case management concerning Medicaid. Pt would like a conference call with case management, her sister Glenda Johnson, daughter Glenda Johnson, and Catalina Lunger.

## 2017-12-09 NOTE — Evaluation (Addendum)
Occupational Therapy Evaluation Patient Details Name: Glenda Johnson MRN: [id number] DOB: 10/10/1955 Today's Date: 12/09/2017    History of Present Illness 62 y.o. female admitted on 12/07/17 for confusion.  Pt found to be lying in the floor at home by her sister.  CT of head with no acute events, MRI is negative for acute events.  Pt found to be in A-fib with RVR.  MRI whoed remote Rt occipital inarct, but no acute changes.  Dx:  Likely seizure due to abnormal EEG.   Pt with other significant PMH of recent stroke (March), HTN, Hepatitis C, COPD, CHF, A-fib, and back surgery.     Clinical Impression   Pt admitted with above. She demonstrates the below listed deficits and will benefit from continued OT to maximize safety and independence with BADLs.  Pt presents to OT with generalized weakness, decreased activity tolerance, impaired balance.  She is very tangential and demonstrates significant anxiety - she does appear to have cognitive deficits with attention, problem solving, awareness, but unsure how much anxiety is contributing.   She currently requires supervision for UB ADLs and min - mod A for LB ADLs.  She lives alone with limited supports. Feel she will benefit from SNF level rehab prior to return home to allow her to regain independence with ADLs, and reduce risk of falls.  She would likely also benefit from psych consult and counseling services.   Will follow acutely.       Follow Up Recommendations  SNF;Supervision/Assistance - 24 hour    Equipment Recommendations  3 in 1 bedside commode;Tub/shower bench    Recommendations for Other Services       Precautions / Restrictions Precautions Precautions: Fall Restrictions Weight Bearing Restrictions: No      Mobility Bed Mobility Overal bed mobility: Needs Assistance Bed Mobility: Supine to Sit     Supine to sit: Min assist     General bed mobility comments: Mod verbal cues for sequencing and encouragement due to anxiety.    Min A to lift trunk    Transfers Overall transfer level: Needs assistance Equipment used: Rolling walker (2 wheeled) Transfers: Sit to/from [facility] Sit to Stand: Min guard Stand pivot transfers: Min assist       General transfer comment: Min A due to mild usteadiness     Balance Overall balance assessment: Needs assistance Sitting-balance support: Feet supported;Bilateral upper extremity supported Sitting balance-Leahy Scale: Fair     Standing balance support: No upper extremity supported Standing balance-Leahy Scale: Fair                             ADL either performed or assessed with clinical judgement   ADL Overall ADL's : Needs assistance/impaired Eating/Feeding: Independent   Grooming: Wash/dry hands;Wash/dry face;Oral care;Brushing hair;Set up;Sitting   Upper Body Bathing: Set up;Sitting   Lower Body Bathing: Moderate assistance;Sit to/from stand   Upper Body Dressing : Minimal assistance;Sitting   Lower Body Dressing: Moderate assistance;Sit to/from stand   Toilet Transfer: Minimal assistance;Stand-pivot;BSC   Toileting- Clothing Manipulation and Hygiene: Moderate assistance;Sit to/from stand       Functional mobility during ADLs: Minimal assistance;Rolling walker General ADL Comments: Pt is very anxious and fearful which limits her performance      Vision Baseline Vision/History: Wears glasses Wears Glasses: At all times Patient Visual Report: No change from baseline Vision Assessment?: Vision to be further assessed  Additional Comments: Pt able to locate all needed  items in room.  She uses cell phone independently per her report       Perception Perception Perception Tested?: Yes   Praxis Praxis Praxis tested?: Within functional limits    Pertinent Vitals/Pain Pain Assessment: No/denies pain     Hand Dominance Right   Extremity/Trunk Assessment Upper Extremity Assessment Upper Extremity Assessment:  Generalized weakness   Lower Extremity Assessment Lower Extremity Assessment: Defer to PT evaluation   Cervical / Trunk Assessment Cervical / Trunk Assessment: Kyphotic   Communication Communication Communication: No difficulties   Cognition Arousal/Alertness: Awake/alert Behavior During Therapy: Anxious;Flat affect Overall Cognitive Status: No family/caregiver present to determine baseline cognitive functioning Area of Impairment: Attention;Following commands;Safety/judgement;Awareness;Problem solving                   Current Attention Level: Sustained   Following Commands: Follows one step commands consistently;Follows multi-step commands inconsistently Safety/Judgement: Decreased awareness of safety   Problem Solving: Difficulty sequencing;Requires verbal cues;Requires tactile cues General Comments: Pt is very anxious, and likes things to be done in a very exact and systematic way.   She reports she has been "fearing the worst", and thus doubts that she is able to perform any activity asked of her.  She requires mod - max encouragement to participate    General Comments       Exercises     Shoulder Instructions      Home Living Family/patient expects to be discharged to:: Skilled nursing facility Living Arrangements: Alone Available Help at Discharge: Family;Friend(s);Available PRN/intermittently Type of Home: House Home Access: Stairs to enter [employer] of Steps: 2 - however, each room has a 6" step to enter  Entrance Stairs-Rails: Right;Left;Can reach both Home Layout: One level           Bathroom Accessibility: Yes How Accessible: Accessible via walker Home Equipment: [place] - 4 wheels;Cane - single point;Shower seat;Bedside commode;Wheelchair - manual          Prior Functioning/Environment Level of Independence: Independent with assistive device(s)        Comments: Pt reports she ambulates intermittently with rollator.  She does  not drive, rides scooter through grocery store.  A friend provides transportation, and sister assists her with feeding her cats.   Pt is very tangential, and difficult to keep on topic.  Anxiety also limits her ability to provide info         OT Problem List: Decreased strength;Decreased activity tolerance;Impaired balance (sitting and/or standing);Decreased safety awareness;Decreased knowledge of use of DME or AE      OT Treatment/Interventions: Self-care/ADL training;Therapeutic exercise;Therapeutic activities;Patient/family education;Balance training;DME and/or AE instruction    OT Goals(Current goals can be found in the care plan section) Acute Rehab OT Goals Patient Stated Goal: Did not state.   OT Goal Formulation: With patient Time For Goal Achievement: 12/23/17 Potential to Achieve Goals: Good ADL Goals Pt Will Perform Grooming: with supervision;standing Pt Will Perform Lower Body Bathing: with supervision;sit to/from stand Pt Will Perform Lower Body Dressing: with supervision;sit to/from stand Pt Will Transfer to Toilet: with supervision;ambulating;regular height toilet;grab bars Pt Will Perform Toileting - Clothing Manipulation and hygiene: with supervision;sit to/from stand  OT Frequency: Min 2X/week   Barriers to D/C: Decreased caregiver support          Co-evaluation              AM-PAC PT "6 Clicks" Daily Activity     Outcome Measure Help from another person eating meals?: None Help from another person  taking care of personal grooming?: A Little Help from another person toileting, which includes using toliet, bedpan, or urinal?: A Little Help from another person bathing (including washing, rinsing, drying)?: A Lot Help from another person to put on and taking off regular upper body clothing?: A Little Help from another person to put on and taking off regular lower body clothing?: A Lot 6 Click Score: 17   End of Session Equipment Utilized During Treatment:  Rolling walker;Gait belt Nurse Communication: Mobility status  Activity Tolerance: Patient tolerated treatment well Patient left: in chair;with call bell/phone within reach;with chair alarm set  OT Visit Diagnosis: Unsteadiness on feet (R26.81)                Time: 1020-1049 OT Time Calculation (min): 29 min Charges:  OT General Charges $OT Visit: 1 Visit OT Evaluation $OT Eval Moderate Complexity: 1 Mod OT Treatments $Therapeutic Activity: 8-22 mins G-Codes:     [facility], OTR/L [phone]   Lucille Passy M 12/09/2017, 11:24 AM

## 2017-12-09 NOTE — Progress Notes (Signed)
ANTICOAGULATION CONSULT NOTE - Follow-Up  Pharmacy Consult for Warfarin Indication: atrial fibrillation  Patient Measurements: Height: 5\' 7"  (170.2 cm) Weight: 125 lb 7.1 oz (56.9 kg) IBW/kg (Calculated) : 61.6   Vital Signs: Temp: 97.6 F (36.4 C) (06/16 0737) Temp Source: Oral (06/16 0737) BP: 166/79 (06/16 0737) Pulse Rate: 41 (06/16 0737)  Labs: Recent Labs    12/07/17 2030 12/07/17 2050 12/08/17 0437 12/08/17 0721 12/08/17 1531 12/09/17 1011  HGB 11.0* 13.6 10.4*  --   --  10.4*  HCT 41.6 40.0 37.3  --   --  37.6  PLT 443*  --  394  --   --  442*  LABPROT 36.2*  --  33.5*  --   --  25.3*  INR 3.67  --  3.33  --   --  2.32  CREATININE 1.49* 1.20* 1.37*  --   --   --   CKTOTAL 47  --   --   --   --   --   TROPONINI 0.07*  --  0.07* 0.07* 0.06*  --     Estimated Creatinine Clearance: 37.8 mL/min (A) (by C-G formula based on SCr of 1.37 mg/dL (H)).  Assessment: 68 YOF on Warfarin PTA for hx Afib. Admit INR 3.67 on PTA dose of 5 mg/day - last dose unknown. Pharmacy consulted to resume dosing this admit.   INR this morning is therapeutic after holding yesterday's dose (INR 2.32 << 3.33, goal of 2-3). Hgb Hct stable, plts 445. No overt signs/symptoms of bleeding noted at this time.    Goal of Therapy:  INR 2-3 Monitor platelets by anticoagulation protocol: Yes   Plan:  - Warfarin 5 mg x 1 - Will continue to monitor for any signs/symptoms of bleeding and will follow up with PT/INR in the a.m.   Thank you for allowing pharmacy to be a part of this patient's care.  Alycia Rossetti, PharmD, BCPS Clinical Pharmacist Pager: [phone] Clinical phone for 12/09/2017 from 7a-3:30p: [phone] If after 3:30p, please call main pharmacy at: [phone] 12/09/2017 12:42 PM

## 2017-12-09 NOTE — Progress Notes (Signed)
PROGRESS NOTE    Glenda Johnson  TKZ:[id number] DOB: 12/25/1955 DOA: 12/07/2017 PCP: [name], MD   Brief Narrative:  Patient 62 year old female history of CHF, COPD, A. fib, recent CVA, recent hospitalization and discharged after being treated for CHF and pneumonia presented to the ED after being found laying on the floor by her sister.  Patient with no recollection of what happened.  Patient noted on admission to be in A. fib with RVR subsequently started on a Cardizem drip.  CT head unremarkable.  Infectious work-up negative.  Patient remains afebrile.  MRI brain unremarkable.  EEG was abnormal and neurology consulted and patient started on anti-epileptic medications.   Assessment & Plan:   Principal Problem:   Seizure ([place]) Active Problems:   Acute on chronic diastolic congestive heart failure (HCC)   Syncope   COPD (chronic obstructive pulmonary disease) ([place])   Cerebrovascular accident (CVA) due to embolism of cerebral artery (HCC)   CKD (chronic kidney disease) stage 3, GFR 30-59 ml/min (HCC)   Hypokalemia   Atrial fibrillation with RVR ([employer])  #1 seizures Patient admitted with concerns for syncopal episode.  CT head MRI head unremarkable for any acute abnormalities.  Patient with no infectious etiologies noted as urinalysis unremarkable chest x-ray negative.  EEG which was done normal and concerning for underlying propensity towards seizures.  Neurology was consulted and patient was seen in consultation by Dr. Malen Gauze on 12/09/2017.  It was felt per neurology that EEG was consistent with generalized slowing as well as episode of paroxysmal buildup of fast activity and by frontotemporal regions without clinical correlate which can certainly be epileptiform but not epileptic at this time.  It is noted that patient has multiple intracranial abnormalities including right occipital infarct.  It was felt that patient had both imaging and electrographic abnormalities which would give an  increased propensity for seizures and recommended that patient be started on anti-elliptic medications.  Patient has been started on Keppra per neurology.  Patient will need outpatient follow-up with neurology.  #  #2 syncope Secondary to problem #1.  Orthostatics were negative.  MRI brain negative for any acute abnormalities.  Ammonia levels within normal limits.  TSH within normal limits at 1.900.  RPR was nonreactive.  3.  A. fib with RVR Heart rate controlled.  Cardizem drip has been discontinued.  Continue home regimen Coreg.  INR at 2.32.  Continue Coumadin for anticoagulation.  Pharmacy managing Coumadin.  4.  Acute on chronic diastolic heart failure Patient noted to be volume overloaded on examination.  BNP noted to be greater than 4500.  Patient with no crackles on examination however did have lower extremity edema.  Cardiac enzymes minimally elevated however flattened likely secondary to acute CHF.  Patient with recent 2D echo done 12/01/2017 and as such not repeated.  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  Patient placed on Lasix 40 mg IV every 12 hours.  Patient with a urine output of 1.850 L over the past 24 hours.  Strict I's and O's.  Daily weights.  Continue Coreg, hydralazine vomiting.  5.  Gastroesophageal reflux disease PPI.  6.  Hypokalemia Likely secondary to diuresis.  Replete.  7.  Hypertension Continue Coreg, hydralazine, Lasix.  #8 COPD Stable.  9.  History of CVA MRI head negative for any acute abnormalities, continue Coumadin.  DVT prophylaxis: Coumadin Code Status: Full Family Communication: Updated patient.  No family at bedside. Disposition Plan: Skilled nursing facility   Consultants:   Urology: Dr. Rory Percy 12/09/2017  Procedures:   EEG 12/08/2017  CT head without contrast  12/07/2017  MRI head 12/08/2017    Antimicrobials:   None   Subjective: Sitting up in bed.  No chest pain.  No shortness of breath.  Feels well.  Objective: Vitals:   12/09/17 0359 12/09/17 0620 12/09/17 0737 12/09/17 1239  BP: 135/67 (!) 157/56 (!) 166/79 (!) 152/76  Pulse: (!) 54  (!) 41 (!) 133  Resp: 18  16 16   Temp: 98 F (36.7 C)  97.6 F (36.4 C) (!) 97.3 F (36.3 C)  TempSrc: Oral  Oral Oral  SpO2: 96%  93% 94%  Weight:      Height:        Intake/Output Summary (Last 24 hours) at 12/09/2017 1302 Last data filed at 12/09/2017 0755 Gross per 24 hour  Intake 1182 ml  Output 1850 ml  Net -668 ml   Filed Weights   12/08/17 0200  Weight: 56.9 kg (125 lb 7.1 oz)    Examination:  General exam: Appears calm and comfortable  Respiratory system: Clear to auscultation. Respiratory effort normal. Cardiovascular system: S1 & S2 heard, RRR. No JVD, murmurs, rubs, gallops or clicks.  1+ bilateral lower extremity edema.   Gastrointestinal system: Abdomen is nondistended, soft and nontender. No organomegaly or masses felt. Normal bowel sounds heard. Central nervous system: Alert and oriented. No focal neurological deficits. Extremities: Symmetric 5 x 5 power. Skin: No rashes, lesions or ulcers Psychiatry: Judgement and insight appear normal. Mood & affect appropriate.     Data Reviewed: I have personally reviewed following labs and imaging studies  CBC: Recent Labs  Lab 12/07/17 2030 12/07/17 2050 12/08/17 0437 12/09/17 1011  WBC 12.9*  --  10.5 17.0*  NEUTROABS 10.2*  --   --  14.2*  HGB 11.0* 13.6 10.4* 10.4*  HCT 41.6 40.0 37.3 37.6  MCV 77.6*  --  75.2* 75.5*  PLT 443*  --  394 673*   Basic Metabolic Panel: Recent Labs  Lab 12/03/17 0453 12/04/17 0503 12/05/17 0409 12/07/17 2030 12/07/17 2050 12/08/17 0437 12/09/17 1011 12/09/17 1025  NA 140 142 140 141 140 141 137  --   K 3.6 3.1* 3.6 4.1 4.2 3.3* 3.1*  --   CL 103 104 102 101 101 103 98*  --    CO2 26 26 29 24   --  26 26  --   GLUCOSE 164* 175* 152* 97 100* 99 255*  --   BUN 24* 28* 30* 30* 38* 30* 24*  --   CREATININE 1.23* 1.13* 1.06* 1.49* 1.20* 1.37* 1.27*  --   CALCIUM 9.0 8.9 9.0 9.0  --  8.5* 8.5*  --   MG 2.0 1.9  --   --   --   --  1.7  --   PHOS  --   --   --   --   --   --   --  2.6   GFR: Estimated Creatinine Clearance: 40.7 mL/min (A) (by C-G formula based on SCr of 1.27 mg/dL (H)). Liver Function Tests: Recent Labs  Lab 12/07/17 2030 12/08/17 0437  AST 26 18  ALT 17 15  ALKPHOS 55 53  BILITOT 2.6* 2.3*  PROT 6.4* 6.0*  ALBUMIN 3.3* 3.1*   No results for input(s): LIPASE, AMYLASE in the last 168  hours. Recent Labs  Lab 12/08/17 0721  AMMONIA 20   Coagulation Profile: Recent Labs  Lab 12/04/17 0503 12/05/17 0409 12/07/17 2030 12/08/17 0437 12/09/17 1011  INR 3.26 3.64 3.67 3.33 2.32   Cardiac Enzymes: Recent Labs  Lab 12/07/17 2030 12/08/17 0437 12/08/17 0721 12/08/17 1531  CKTOTAL 47  --   --   --   TROPONINI 0.07* 0.07* 0.07* 0.06*   BNP (last 3 results) No results for input(s): PROBNP in the last 8760 hours. HbA1C: No results for input(s): HGBA1C in the last 72 hours. CBG: Recent Labs  Lab 12/07/17 1938  GLUCAP 107*   Lipid Profile: No results for input(s): CHOL, HDL, LDLCALC, TRIG, CHOLHDL, LDLDIRECT in the last 72 hours. Thyroid Function Tests: Recent Labs    12/08/17 0721  TSH 1.900   Anemia Panel: Recent Labs    12/08/17 0437 12/08/17 0721  VITAMINB12  --  412  FOLATE  --  9.7  FERRITIN  --  57  TIBC  --  398  IRON  --  21*  RETICCTPCT 1.5  --    Sepsis Labs: Recent Labs  Lab 12/03/17 0453  PROCALCITON 0.36    Recent Results (from the past 240 hour(s))  Blood Culture (routine x 2)     Status: None   Collection Time: 11/30/17 11:50 PM  Result Value Ref Range Status   Specimen Description LEFT ANTECUBITAL  Final   Special Requests   Final    BOTTLES DRAWN AEROBIC AND ANAEROBIC Blood Culture  adequate volume   Culture   Final    NO GROWTH 5 DAYS Performed at [facility], [address] [place], [place] [address]    Report Status 12/06/2017 FINAL  Final  Blood Culture (routine x 2)     Status: None   Collection Time: 12/01/17 12:03 AM  Result Value Ref Range Status   Specimen Description BLOOD RIGHT FOREARM  Final   Special Requests   Final    BOTTLES DRAWN AEROBIC AND ANAEROBIC Blood Culture adequate volume   Culture   Final    NO GROWTH 5 DAYS Performed at [facility], [address], [place], [place] [address]    Report Status 12/06/2017 FINAL  Final  Urine culture     Status: None   Collection Time: 12/01/17 12:06 AM  Result Value Ref Range Status   Specimen Description   Final    URINE, CLEAN CATCH Performed at [facility], [address], [place], [place] [address]    Special Requests   Final    NONE Performed at [facility], [address] [place], [place] [address]    Culture   Final    NO GROWTH Performed at [facility], [place] [address] [place], [place] [address]    Report Status 12/02/2017 FINAL  Final  MRSA PCR Screening     Status: Abnormal   Collection Time: 12/01/17  4:04 AM  Result Value Ref Range Status   MRSA by PCR POSITIVE (A) NEGATIVE Final    Comment: RESULT CALLED TO, READ BACK BY AND VERIFIED WITH: [email or web address] @ 1600 ON 6.8.19 BY L.BOWMAN        The GeneXpert MRSA Assay (FDA approved for NASAL specimens only), is one component of a comprehensive MRSA colonization surveillance program. It is not intended to diagnose MRSA infection nor to guide or monitor treatment for MRSA infections. Performed at [facility], [address] [place], [place] [address]   Respiratory Panel by PCR  Status: None   Collection Time: 12/01/17 12:30 PM  Result Value Ref Range Status   Adenovirus NOT DETECTED NOT DETECTED Final   Coronavirus 229E NOT DETECTED NOT DETECTED Final   Coronavirus HKU1 NOT DETECTED NOT DETECTED Final   Coronavirus  NL63 NOT DETECTED NOT DETECTED Final   Coronavirus OC43 NOT DETECTED NOT DETECTED Final   Metapneumovirus NOT DETECTED NOT DETECTED Final   Rhinovirus / Enterovirus NOT DETECTED NOT DETECTED Final   Influenza A NOT DETECTED NOT DETECTED Final   Influenza B NOT DETECTED NOT DETECTED Final   Parainfluenza Virus 1 NOT DETECTED NOT DETECTED Final   Parainfluenza Virus 2 NOT DETECTED NOT DETECTED Final   Parainfluenza Virus 3 NOT DETECTED NOT DETECTED Final   Parainfluenza Virus 4 NOT DETECTED NOT DETECTED Final   Respiratory Syncytial Virus NOT DETECTED NOT DETECTED Final   Bordetella pertussis NOT DETECTED NOT DETECTED Final   Chlamydophila pneumoniae NOT DETECTED NOT DETECTED Final   Mycoplasma pneumoniae NOT DETECTED NOT DETECTED Final    Comment: Performed at [facility], [employer] [address], [place], [place] [address]         Radiology Studies: Dg Chest 2 View  Result Date: 12/07/2017 CLINICAL DATA:  Syncope.  History of chronic heart failure and COPD. EXAM: CHEST - 2 VIEW COMPARISON:  Single-view of the chest 12/01/2017 11/01/2017. FINDINGS: Small to moderate bilateral pleural effusions and basilar atelectasis appear somewhat improved compared to the most recent examination. Cardiac silhouette is largely obscured. No evidence of pulmonary edema. Aortic atherosclerosis is noted. No acute bony abnormality. IMPRESSION: Small to moderate bilateral pleural effusions and basilar atelectasis appear improved since the most recent examination. Atherosclerosis. Electronically Signed   By: Inge Rise M.D.   On: 12/07/2017 19:29   Ct Head Wo Contrast  Result Date: 12/07/2017 CLINICAL DATA:  Patient found down at 4:30 p.m. today. EXAM: CT HEAD WITHOUT CONTRAST TECHNIQUE: Contiguous axial images were obtained from the base of the skull through the vertex without intravenous contrast. COMPARISON:  Head CT 09/20/2017.  Brain MRI 09/19/2017. FINDINGS: Brain: No evidence of acute infarction,  hemorrhage, hydrocephalus, extra-axial collection or mass lesion/mass effect. Atrophy, chronic microvascular ischemic change and remote right occipital infarct are identified. Additional smaller infarcts seen on MRI are difficult to appreciate on this exam. Vascular: No hyperdense vessel or unexpected calcification. Skull: Intact. Sinuses/Orbits: Negative. Other: None. IMPRESSION: No acute abnormality. Atrophy, chronic microvascular ischemic change and remote infarcts. Electronically Signed   By: Inge Rise M.D.   On: 12/07/2017 19:53   Mr Brain Wo Contrast (neuro Protocol)  Result Date: 12/08/2017 CLINICAL DATA:  Altered level of consciousness. EXAM: MRI HEAD WITHOUT CONTRAST TECHNIQUE: Multiplanar, multiecho pulse sequences of the brain and surrounding structures were obtained without intravenous contrast. COMPARISON:  CT head 12/07/2017 FINDINGS: Brain: Negative for acute infarct. Chronic infarct right occipital parietal lobe with chronic blood products. Mild chronic microvascular ischemia in the white matter. Small chronic infarct right pons and left cerebellum Ventricle size normal. Mild cerebral atrophy. Negative for hemorrhage or mass. Vascular: Normal arterial flow voids Skull and upper cervical spine: Negative Sinuses/Orbits: Negative Other: None IMPRESSION: No acute abnormality. Chronic infarct right occipital parietal lobe and mild chronic microvascular ischemia. Electronically Signed   By: Franchot Gallo M.D.   On: 12/08/2017 09:35   Dg Foot Complete Right  Result Date: 12/07/2017 CLINICAL DATA:  Right foot pain.  No known injury. EXAM: RIGHT FOOT COMPLETE - 3+ VIEW COMPARISON:  None. FINDINGS: No acute bony  or joint abnormality is seen. Mild first MTP osteoarthritis is noted. Large calcaneal spur at the Achilles tendon insertion is noted. The patient also has a small plantar calcaneal spur. IMPRESSION: No acute abnormality. Mild first MTP osteoarthritis. Calcaneal spurring. Electronically  Signed   By: Inge Rise M.D.   On: 12/07/2017 19:30        Scheduled Meds: . carvedilol  6.25 mg Oral BID WC  . fluticasone furoate-vilanterol  1 puff Inhalation Daily   And  . umeclidinium bromide  1 puff Inhalation Daily  . furosemide  40 mg Intravenous BID  . hydrALAZINE  25 mg Oral Q8H  . levETIRAcetam  500 mg Oral BID  . pantoprazole  40 mg Oral Q1200  . potassium chloride SA  20 mEq Oral BID  . potassium chloride  40 mEq Oral Once  . [START ON 12/10/2017] predniSONE  10 mg Oral Q breakfast  . warfarin  5 mg Oral ONCE-1800  . Warfarin - Pharmacist Dosing Inpatient   Does not apply q1800   Continuous Infusions: . diltiazem (CARDIZEM) infusion Stopped (12/08/17 1351)  . magnesium sulfate 1 - 4 g bolus IVPB       LOS: 0 days    Time spent: 40 minutes    Irine Seal, MD [facility] Pager [phone]  If 7PM-7AM, please contact night-coverage [email or web address] Password [email or web address] 12/09/2017, 1:02 PM

## 2017-12-09 NOTE — Consult Note (Addendum)
NEURO HOSPITALIST CONSULT NOTE   Requestig physician: Dr. Grandville Silos  Reason for Consult possible seizure  History obtained from:  Patient     HPI:                                                                                                                                          Glenda Johnson is an 62 y.o. female with past medical history of atrial fibrillation, strokes, hypertension, tobacco use.  Patient was brought to the hospital secondary to possible syncopal versus seizure.  Patient gives history:  Per patient she was sitting in a recliner in the day before 1700 hrs she had seen her sister who brings her food.  At this point she does not recall what happened until her sister had seen her the next day at 1700 hrs.  Apparently prior to event she was in her close, in her recliner.  When her sister came to see her at 1700 hrs. she was naked, covered in feces, covered in urine and naked.  Patient has no recollection of this and does not know when this happened.  Patient states that she had no aura or prodrome that this was to happen.  She does state that she has never had an event like this.  She has had events in which she was very weak as she does not move around much and trying to plug in a plug she did fall and feel very weak but never lost consciousness.  This event she clearly did lose consciousness and was confused for quite a long time.  She states that she does not drive anymore but the reason she does not drive is secondary to having sudden onset of fecal and urinary incontinence however this has resolved.  The other reason she states that she does not drive is she had not drive for a long time and does not feel comfortable with her self driving.  Patient does live alone but her sister does take care of her.  Recent discharge from any pain hospital for acute on chronic hypoxic respiratory failure, COPD exacerbation, metabolic encephalopathy, LV thrombus for which  she is on anti-coagulation, chronic kidney disease, it fibrillation essential hypertension.  Past Medical History:  Diagnosis Date  . Atrial fibrillation ([place])   . CHF (congestive heart failure) ([place])   . COPD (chronic obstructive pulmonary disease) ([place])   . Hepatitis C   . Pulmonary thrombosis ([place])   . Renal artery stenosis ([place])   . Secondary hypertension   . Stroke ([place])   . Tobacco abuse     Past Surgical History:  Procedure Laterality Date  . BACK SURGERY    . CARDIAC CATHETERIZATION N/A 05/25/2016   Procedure: Left Heart Cath and Coronary Angiography;  Surgeon: Lorretta Harp,  MD;  Location: [facility];  Service: Cardiovascular;  Laterality: N/A;  . PLACEMENT OF BREAST IMPLANTS Bilateral   . stent renal artery      Family History  Problem Relation Age of Onset  . Diabetes Maternal Grandmother          Social History:  reports that she has been smoking cigarettes.  She has never used smokeless tobacco. She reports that she does not drink alcohol or use drugs.  Allergies  Allergen Reactions  . Codeine Anaphylaxis  . Tramadol Anaphylaxis    No hydrocodone or anything connected No hydrocodone or anything connected   . Clarithromycin Nausea And Vomiting  . Citalopram Other (See Comments)    Caused more depression   . Fish Allergy Itching    ears  . Other     Patient says that all narcotics give her anaphylaxis.   . Wellbutrin [Bupropion] Other (See Comments)    "could not speak a full sentence for a whole year"    MEDICATIONS:                                                                                                                     Scheduled: . carvedilol  6.25 mg Oral BID WC  . fluticasone furoate-vilanterol  1 puff Inhalation Daily   And  . umeclidinium bromide  1 puff Inhalation Daily  . furosemide  40 mg Intravenous BID  . hydrALAZINE  25 mg Oral Q8H  . pantoprazole  40 mg Oral Q1200  . potassium chloride SA  20 mEq Oral BID  .  [START ON 12/10/2017] predniSONE  10 mg Oral Q breakfast  . Warfarin - Pharmacist Dosing Inpatient   Does not apply q1800     ROS:                                                                                                                                       History obtained from the patient  General ROS: negative for - chills, fatigue, fever, night sweats, weight gain or weight loss Psychological ROS: negative for - behavioral disorder, hallucinations, memory difficulties, mood swings or suicidal ideation Ophthalmic ROS: negative for - blurry vision, double vision, eye pain or loss of vision ENT ROS: negative for - epistaxis, nasal discharge, oral lesions, sore throat, tinnitus or vertigo Allergy and Immunology ROS: negative for - hives or itchy/watery eyes Hematological and  Lymphatic ROS: negative for - bleeding problems, bruising or swollen lymph nodes Endocrine ROS: negative for - galactorrhea, hair pattern changes, polydipsia/polyuria or temperature intolerance Respiratory ROS: negative for - cough, hemoptysis, shortness of breath or wheezing Cardiovascular ROS: Positive for -  irregular heartbeat Gastrointestinal ROS: Positive for -transient stool incontinence  Genito-Urinary ROS: Positive for -transient incontinence or urinary frequency/urgency Musculoskeletal ROS: Positive for -  muscular weakness Neurological ROS: as noted in HPI Dermatological ROS: negative for rash and skin lesion changes   Blood pressure (!) 166/79, pulse (!) 41, temperature 97.6 F (36.4 C), temperature source Oral, resp. rate 16, height 5\' 7"  (1.702 m), weight 56.9 kg (125 lb 7.1 oz), SpO2 93 %.   General Examination:                                                                                                       Physical Exam  HEENT-  Normocephalic, no lesions, without obvious abnormality.  Normal external eye and conjunctiva.   Extremities- Warm, dry and intact Musculoskeletal-no joint  tenderness, deformity or swelling Skin-warm and dry, no hyperpigmentation, vitiligo, or suspicious lesions  Neurological Examination Mental Status: Alert, oriented, thought content is bradyphrenic.  Speech fluent without evidence of aphasia.  Able to follow 3 step commands without difficulty. Cranial Nerves: II:  Visual fields grossly normal,  III,IV, VI: ptosis not present, extra-ocular motions intact bilaterally pupils equal, round, reactive to light and accommodation V,VII: smile symmetric, facial light touch sensation normal bilaterally VIII: hearing normal bilaterally IX,X: uvula rises symmetrically XI: bilateral shoulder shrug XII: midline tongue extension Motor: Right : Upper extremity   5/5    Left:     Upper extremity   5/5  Lower extremity   5/5     Lower extremity   5/5 Tone and bulk:normal tone throughout; no atrophy noted Sensory: Pinprick and light touch intact throughout, bilaterally Deep Tendon Reflexes: 2+ and symmetric throughout with no ankle jerk Plantars: Right: downgoing   Left: downgoing Cerebellar: normal finger-to-nose,and normal heel-to-shin test Gait: Not tested   Lab Results: Basic Metabolic Panel: Recent Labs  Lab 12/03/17 0453 12/04/17 0503 12/05/17 0409 12/07/17 2030 12/07/17 2050 12/08/17 0437  NA 140 142 140 141 140 141  K 3.6 3.1* 3.6 4.1 4.2 3.3*  CL 103 104 102 101 101 103  CO2 26 26 29 24   --  26  GLUCOSE 164* 175* 152* 97 100* 99  BUN 24* 28* 30* 30* 38* 30*  CREATININE 1.23* 1.13* 1.06* 1.49* 1.20* 1.37*  CALCIUM 9.0 8.9 9.0 9.0  --  8.5*  MG 2.0 1.9  --   --   --   --     CBC: Recent Labs  Lab 12/07/17 2030 12/07/17 2050 12/08/17 0437  WBC 12.9*  --  10.5  NEUTROABS 10.2*  --   --   HGB 11.0* 13.6 10.4*  HCT 41.6 40.0 37.3  MCV 77.6*  --  75.2*  PLT 443*  --  394    Cardiac Enzymes: Recent Labs  Lab 12/07/17 2030 12/08/17 0437 12/08/17 0721 12/08/17  [place]  --   --   --   TROPONINI 0.07* 0.07* 0.07*  0.06*    Lipid Panel: No results for input(s): CHOL, TRIG, HDL, CHOLHDL, VLDL, LDLCALC in the last 168 hours.  Imaging: Dg Chest 2 View  Result Date: 12/07/2017 CLINICAL DATA:  Syncope.  History of chronic heart failure and COPD. EXAM: CHEST - 2 VIEW COMPARISON:  Single-view of the chest 12/01/2017 11/01/2017. FINDINGS: Small to moderate bilateral pleural effusions and basilar atelectasis appear somewhat improved compared to the most recent examination. Cardiac silhouette is largely obscured. No evidence of pulmonary edema. Aortic atherosclerosis is noted. No acute bony abnormality. IMPRESSION: Small to moderate bilateral pleural effusions and basilar atelectasis appear improved since the most recent examination. Atherosclerosis. Electronically Signed   By: Inge Rise M.D.   On: 12/07/2017 19:29   Ct Head Wo Contrast  Result Date: 12/07/2017 CLINICAL DATA:  Patient found down at 4:30 p.m. today. EXAM: CT HEAD WITHOUT CONTRAST TECHNIQUE: Contiguous axial images were obtained from the base of the skull through the vertex without intravenous contrast. COMPARISON:  Head CT 09/20/2017.  Brain MRI 09/19/2017. FINDINGS: Brain: No evidence of acute infarction, hemorrhage, hydrocephalus, extra-axial collection or mass lesion/mass effect. Atrophy, chronic microvascular ischemic change and remote right occipital infarct are identified. Additional smaller infarcts seen on MRI are difficult to appreciate on this exam. Vascular: No hyperdense vessel or unexpected calcification. Skull: Intact. Sinuses/Orbits: Negative. Other: None. IMPRESSION: No acute abnormality. Atrophy, chronic microvascular ischemic change and remote infarcts. Electronically Signed   By: Inge Rise M.D.   On: 12/07/2017 19:53   Mr Brain Wo Contrast (neuro Protocol)  Result Date: 12/08/2017 CLINICAL DATA:  Altered level of consciousness. EXAM: MRI HEAD WITHOUT CONTRAST TECHNIQUE: Multiplanar, multiecho pulse sequences of the brain  and surrounding structures were obtained without intravenous contrast. COMPARISON:  CT head 12/07/2017 FINDINGS: Brain: Negative for acute infarct. Chronic infarct right occipital parietal lobe with chronic blood products. Mild chronic microvascular ischemia in the white matter. Small chronic infarct right pons and left cerebellum Ventricle size normal. Mild cerebral atrophy. Negative for hemorrhage or mass. Vascular: Normal arterial flow voids Skull and upper cervical spine: Negative Sinuses/Orbits: Negative Other: None IMPRESSION: No acute abnormality. Chronic infarct right occipital parietal lobe and mild chronic microvascular ischemia. Electronically Signed   By: Franchot Gallo M.D.   On: 12/08/2017 09:35   Dg Foot Complete Right  Result Date: 12/07/2017 CLINICAL DATA:  Right foot pain.  No known injury. EXAM: RIGHT FOOT COMPLETE - 3+ VIEW COMPARISON:  None. FINDINGS: No acute bony or joint abnormality is seen. Mild first MTP osteoarthritis is noted. Large calcaneal spur at the Achilles tendon insertion is noted. The patient also has a small plantar calcaneal spur. IMPRESSION: No acute abnormality. Mild first MTP osteoarthritis. Calcaneal spurring. Electronically Signed   By: Inge Rise M.D.   On: 12/07/2017 19:30   EEG: Description: Pt is noted to be very confused (appears asleep). A 6 Hz theta rhythm is seen posteriorly with superimposed, generalized beta (13 Hz).  There is no well defined drowsiness or stage 2 sleep identified.  At approximately 10 minutes into the recording there is a rapid increase in the amplitude of 20Hz  fast activity seen over the bifrontotemporal head regions, lasting about 49min 30 sec, without any clinical correlate on video (pt appears to remain asleep).  Neither HV or photic were performed.   EKG was monitored and noted to be irregular (? AFib) at 108 bpm with  occasional PVC's.  No definite epileptiform changes were noted.  Impression: This is an abnormal EEG due to  background slowing seen throughout the tracing.  This is a non-specific finding that can be seen with toxic, metabolic, diffuse, or multifocal structural processes.  Additionally, there was at least one episode of paroxysmal, bifrontotemporal buildup of fast activity, without clinical correlate.  Although not frankly epileptiform, the abrupt background change might signify an underlying propensity towards seizures.  Given the noted clinical history, a long term video EEG might be useful for further evaluation, if clinically indicated.  A single EEG without epileptiform changes does not exclude the diagnosis of epilepsy. Clinical correlation advised.   Assessment and plan per attending neurologist  Glenda Quill PA-C [facility] [phone]  12/09/2017, 9:28 AM  Attending addendum Patient seen and examined dependently. I have independently reviewed imaging.  MRI shows remote right occipital infarct with possible blood products.No new strokes or bleeds. EEG consistent with generalized slowing as well as episode of paroxysmal buildup of fast activity and bifrontotemporal regions without clinical correlate which can certainly be epileptiform but not epileptic at this time.  I reviewed the history and physical documented above and agree with it.  Assessment:   62 year old female with A. fib COPD, hypertension, on anticoagulant presenting to hospital with sudden onset of loss of consciousness and prolonged altered mental status.  During this time patient fell out of her chair, took off her clothes, had both fecal and urinary incontinence with prolonged confusional state.  Patient does have multiple intracranial abnormalities including a right occipital infarct in the past.  This would be the first epileptiform event per patient history but she has both imaging and electrographic abnormalities, which would give her an increased propensity for seizures and should be started on  antiepileptics.   Impression: - More likely seizure than syncope -History of stroke in the past with area of encephalomalacia in the right occipital lobe -Abnormal EEG as above  Recommendations: --  Will start on 500 mg BID Keppra --Outpatient neurology follow-up.  Outpatient neurology can consider long-term video EEG to better characterize seizures if these episodes continue to happen. --Maintain  seizure precautions --Per [facility] statutes, patients with seizures are not allowed to drive until  they have been seizure-free for six months. Use caution when using heavy equipment or power tools. Avoid working on ladders or at heights. Take showers instead of baths. Ensure the water temperature is not too high on the home water heater. Do not go swimming alone. When caring for infants or small children, sit down when holding, feeding, or changing them to minimize risk of injury to the child in the event you have a seizure.   Also, Maintain good sleep hygiene. Avoid alcohol.  -- Amie [place], MD [facility] Pager: [phone] If 7pm to 7am, please call on call as listed on AMION.

## 2017-12-09 NOTE — Progress Notes (Signed)
Pt refused 0600 hydralazine; BP 157/56 MAP 102. RN educated pt on MD's orders to give BP med to pt and informed pt of her BP. Pt stated, "I don't think I need it. I think that's what's giving me diarrhea." Pt had diarrhea at this time. Will continue to monitor.

## 2017-12-09 NOTE — Evaluation (Signed)
Speech Language Pathology Evaluation Patient Details Name: Glenda Johnson MRN: [id number] DOB: Mar 14, 1956 Today's Date: 12/09/2017 Time: 1415-1500 SLP Time Calculation (min) (ACUTE ONLY): 45 min  Problem List:  Patient Active Problem List   Diagnosis Date Noted  . Seizure ([place]) 12/09/2017  . Syncope 12/08/2017  . HCAP (healthcare-associated pneumonia) 12/01/2017  . Acute on chronic diastolic congestive heart failure ([place]) 12/01/2017  . Lactic acidosis 12/01/2017  . Acute on chronic respiratory failure with hypoxia ([place]) 12/01/2017  . Sepsis due to undetermined organism ([place]) 12/01/2017  . LV (left ventricular) mural thrombus without MI 12/01/2017  . Acute metabolic encephalopathy 70/35/0093  . Severe sepsis ([place]) 11/01/2017  . Tobacco abuse   . Renal artery stenosis ([place])   . Stroke ([place])   . Secondary hypertension   . COPD exacerbation ([place]) 10/31/2017  . Atrial fibrillation with RVR ([place])   . CKD (chronic kidney disease) stage 3, GFR 30-59 ml/min ([facility]) 10/09/2017  . Hypokalemia 10/09/2017  . Dyspnea 10/09/2017  . CHF (congestive heart failure) ([place]) 10/09/2017  . Cerebral thrombosis with cerebral infarction 09/20/2017  . Congestive heart failure ([place])   . Cerebrovascular accident (CVA) due to embolism of cerebral artery ([place])   . Cardiac mass   . Anticoagulation adequate   . Flash pulmonary edema ([place]) 09/15/2017  . Community acquired pneumonia   . Abnormal echocardiogram   . Hypoxia 05/22/2016  . Acute respiratory failure with hypoxia ([place]) 05/22/2016  . Acute respiratory failure ([place]) 05/22/2016  . Sepsis ([place]) 05/22/2016  . Elevated troponin 05/22/2016  . COPD (chronic obstructive pulmonary disease) ([place]) 05/22/2016  . AKI (acute kidney injury) ([place]) 05/22/2016  . Acute pulmonary edema [facility])    Past Medical History:  Past Medical History:  Diagnosis Date  . Atrial fibrillation ([place])   . CHF (congestive heart failure) ([place])   . COPD (chronic obstructive pulmonary  disease) ([place])   . Hepatitis C   . Pulmonary thrombosis ([place])   . Renal artery stenosis ([place])   . Secondary hypertension   . Stroke ([place])   . Tobacco abuse    Past Surgical History:  Past Surgical History:  Procedure Laterality Date  . BACK SURGERY    . CARDIAC CATHETERIZATION N/A 05/25/2016   Procedure: Left Heart Cath and Coronary Angiography;  Surgeon: Lorretta Harp, MD;  Location: [facility];  Service: Cardiovascular;  Laterality: N/A;  . PLACEMENT OF BREAST IMPLANTS Bilateral   . stent renal artery     HPI:  Glenda Johnson is a 62 y.o. female with history of CHF, COPD, atrial fibrillation, stroke who was recently admitted to [facility] and discharged after being treated for CHF and pneumonia 3 days ago was brought to the ER after patient was found to be lying on the floor. MRI showed no acute abnormality. Chronic infarct right occipital parietal lobe and mild chronic microvascular ischemia.   Assessment / Plan / Recommendation Clinical Impression   Patient presents with moderate-severe cognitive communication deficits; pt was not evaluated by SLP for cognition s/p recent CVA in March. Suspect cognition impacted both by CVA and possibly psychological issues, anxiety. As SLP entered room, pt reported frustration as she was attempting to use her phone to call her son-in-law and text her daughter without success. Conversation is extremely tangential; pt requires nearly continuous redirection to task. Administered MOCA- Basic as well as supplemental cognitive tasks. Pt scored 17/30 (>26 is WNL), with impairments noted in selective attention, problem solving, verbal reasoning, visuoperception and organization. Pt  demonstrated some awareness that she was making errors but unable to identify or correct without assistance. Clock drawing showed significant disorganization, and pt perseverated a number. Lability also noted; pt began crying several times during casual conversation.  Agree SNF would be appropriate at d/c and that pt may benefit from psychological or counseling services. Will follow briefly acutely.     SLP Assessment  SLP Recommendation/Assessment: Patient needs continued Speech Lanaguage Pathology Services SLP Visit Diagnosis: Cognitive communication deficit (R41.841)    Follow Up Recommendations  Skilled Nursing facility    Frequency and Duration min 1 x/week  1 week      SLP Evaluation Cognition  Overall Cognitive Status: No family/caregiver present to determine baseline cognitive functioning Arousal/Alertness: Awake/alert Orientation Level: Oriented X4 Attention: Focused;Sustained;Selective Focused Attention: Appears intact Sustained Attention: Appears intact Selective Attention: Impaired Selective Attention Impairment: Verbal basic;Functional basic Memory: Impaired Memory Impairment: Decreased short term memory;Decreased recall of new information Decreased Short Term Memory: Verbal basic Awareness: Impaired Awareness Impairment: Emergent impairment;Anticipatory impairment Problem Solving: Impaired Problem Solving Impairment: Verbal basic Executive Function: Reasoning;Organizing;Self Monitoring;Self Correcting Reasoning: Impaired Reasoning Impairment: Verbal complex Organizing: Impaired Organizing Impairment: Functional basic(clock drawing impaired) Self Monitoring: Impaired Self Monitoring Impairment: Functional basic Self Correcting: Impaired Self Correcting Impairment: Functional basic Behaviors: Lability Safety/Judgment: Impaired       Comprehension  Auditory Comprehension Overall Auditory Comprehension: Appears within functional limits for tasks assessed Yes/No Questions: Within Functional Limits Commands: Within Functional Limits Conversation: Complex Visual Recognition/Discrimination Discrimination: Within Function Limits Reading Comprehension Reading Status: Not tested    Expression Expression Primary Mode of  Expression: Verbal Verbal Expression Overall Verbal Expression: Impaired at baseline Initiation: No impairment Automatic Speech: Name;Social Response Level of Generative/Spontaneous Verbalization: Conversation Repetition: No impairment Pragmatics: Impairment Impairments: Abnormal affect;Interpretation of nonverbal communication;Topic appropriateness;Topic maintenance;Other (comment)(tangential) Interfering Components: Attention Non-Verbal Means of Communication: Not applicable Written Expression Dominant Hand: Right Written Expression: Not tested   Oral / Motor  Oral Motor/Sensory Function Overall Oral Motor/Sensory Function: Within functional limits Motor Speech Overall Motor Speech: Appears within functional limits for tasks assessed   Ze[place], [place], [place] Pathologist [place] 12/09/2017, 3:38 PM

## 2017-12-10 DIAGNOSIS — I4891 Unspecified atrial fibrillation: Secondary | ICD-10-CM | POA: Diagnosis not present

## 2017-12-10 DIAGNOSIS — R569 Unspecified convulsions: Secondary | ICD-10-CM | POA: Diagnosis not present

## 2017-12-10 DIAGNOSIS — I5033 Acute on chronic diastolic (congestive) heart failure: Secondary | ICD-10-CM | POA: Diagnosis not present

## 2017-12-10 DIAGNOSIS — I6340 Cerebral infarction due to embolism of unspecified cerebral artery: Secondary | ICD-10-CM | POA: Diagnosis not present

## 2017-12-10 LAB — CBC
HCT: 35 % — ABNORMAL LOW (ref 36.0–46.0)
Hemoglobin: 9.8 g/dL — ABNORMAL LOW (ref 12.0–15.0)
MCH: 21.1 pg — AB (ref 26.0–34.0)
MCHC: 28 g/dL — ABNORMAL LOW (ref 30.0–36.0)
MCV: 75.4 fL — ABNORMAL LOW (ref 78.0–100.0)
PLATELETS: 410 10*3/uL — AB (ref 150–400)
RBC: 4.64 MIL/uL (ref 3.87–5.11)
RDW: 21.3 % — AB (ref 11.5–15.5)
WBC: 14.3 10*3/uL — AB (ref 4.0–10.5)

## 2017-12-10 LAB — BASIC METABOLIC PANEL
Anion gap: 8 (ref 5–15)
BUN: 17 mg/dL (ref 6–20)
CO2: 29 mmol/L (ref 22–32)
Calcium: 8.4 mg/dL — ABNORMAL LOW (ref 8.9–10.3)
Chloride: 99 mmol/L — ABNORMAL LOW (ref 101–111)
Creatinine, Ser: 1.13 mg/dL — ABNORMAL HIGH (ref 0.44–1.00)
GFR calc Af Amer: 59 mL/min — ABNORMAL LOW (ref >60)
GFR, EST NON AFRICAN AMERICAN: 51 mL/min — AB (ref >60)
Glucose, Bld: 108 mg/dL — ABNORMAL HIGH (ref 65–99)
POTASSIUM: 3.5 mmol/L (ref 3.5–5.1)
SODIUM: 136 mmol/L (ref 135–145)

## 2017-12-10 LAB — MAGNESIUM: MAGNESIUM: 2.2 mg/dL (ref 1.7–2.4)

## 2017-12-10 LAB — FOLATE RBC
Folate, Hemolysate: 465.3 ng/mL
Folate, RBC: 1289 ng/mL (ref >498)
Hematocrit: 36.1 % (ref 34.0–46.6)

## 2017-12-10 LAB — PROTIME-INR
INR: 2.02
PROTHROMBIN TIME: 22.7 s — AB (ref 11.4–15.2)

## 2017-12-10 MED ORDER — FUROSEMIDE 40 MG PO TABS
40.0000 mg | ORAL_TABLET | Freq: Two times a day (BID) | ORAL | Status: DC
  Administered 2017-12-10 – 2017-12-13 (×6): 40 mg via ORAL
  Filled 2017-12-10 (×8): qty 1

## 2017-12-10 MED ORDER — HYDRALAZINE HCL 50 MG PO TABS
50.0000 mg | ORAL_TABLET | Freq: Three times a day (TID) | ORAL | Status: DC
  Administered 2017-12-10 – 2017-12-13 (×8): 50 mg via ORAL
  Filled 2017-12-10 (×9): qty 1

## 2017-12-10 MED ORDER — POTASSIUM CHLORIDE CRYS ER 20 MEQ PO TBCR
40.0000 meq | EXTENDED_RELEASE_TABLET | Freq: Once | ORAL | Status: AC
  Administered 2017-12-10: 40 meq via ORAL
  Filled 2017-12-10: qty 2

## 2017-12-10 MED ORDER — WARFARIN SODIUM 5 MG PO TABS
5.0000 mg | ORAL_TABLET | Freq: Once | ORAL | Status: AC
  Administered 2017-12-10: 5 mg via ORAL
  Filled 2017-12-10: qty 1

## 2017-12-10 NOTE — Progress Notes (Signed)
Physical Therapy Treatment Patient Details Name: Glenda Johnson MRN: [id number] DOB: October 12, 1955 Today's Date: 12/10/2017    History of Present Illness 62 y.o. female admitted on 12/07/17 for confusion.  Pt found to be lying in the floor at home by her sister.  CT of head with no acute events, MRI is negative for acute events.  Pt found to be in A-fib with RVR.  MRI whoed remote Rt occipital inarct, but no acute changes.  Dx:  Likely seizure due to abnormal EEG.   Pt with other significant PMH of recent stroke (March), HTN, Hepatitis C, COPD, CHF, A-fib, and back surgery.      PT Comments    Patient is making progress toward PT goals. Pt grossly required supervision/min guard for safety with mobility. Pt refused ambulating with RW so therapist brought rollator to complete session. Pt is very anxious with mobility but agreeable to ambulating in room. Continue to progress as tolerated.    Follow Up Recommendations  Home health PT;Supervision/Assistance - 24 hour     Equipment Recommendations  None recommended by PT    Recommendations for Other Services       Precautions / Restrictions Precautions Precautions: Fall Restrictions Weight Bearing Restrictions: No    Mobility  Bed Mobility Overal bed mobility: Needs Assistance Bed Mobility: Supine to Sit     Supine to sit: Supervision     General bed mobility comments: for safety; use of rails  Transfers Overall transfer level: Needs assistance Equipment used: None(rollator) Transfers: Sit to/from Stand;Stand Pivot Transfers Sit to Stand: Min guard;Min assist Stand pivot transfers: Min guard       General transfer comment: pt able to stand and pivot without physical assistance from EOB and recliner and min A to power up from low commode with use of grab bar  Ambulation/Gait Ambulation/Gait assistance: Min guard Gait Distance (Feet): (~50ft in room ) Assistive device: (rollator) Gait Pattern/deviations: Decreased stride  length;Step-through pattern     General Gait Details: pt refused ambulating with RW and ambulating in the hallway; pt overall min guard/supervision for safety and without LOB; decreased cadence and stride length   Stairs             Wheelchair Mobility    Modified Rankin (Stroke Patients Only) Modified Rankin (Stroke Patients Only) Pre-Morbid Rankin Score: Moderate disability Modified Rankin: Moderately severe disability     Balance Overall balance assessment: Needs assistance Sitting-balance support: Feet supported Sitting balance-Leahy Scale: Fair     Standing balance support: No upper extremity supported Standing balance-Leahy Scale: Fair                              Cognition Arousal/Alertness: Awake/alert Behavior During Therapy: Anxious;Flat affect Overall Cognitive Status: No family/caregiver present to determine baseline cognitive functioning Area of Impairment: Attention;Following commands;Safety/judgement;Awareness;Problem solving                   Current Attention Level: Sustained   Following Commands: Follows one step commands consistently Safety/Judgement: Decreased awareness of safety   Problem Solving: Requires verbal cues General Comments: pt is easily agitated; pt continues to be very anxious with mobility and reports "I can't do that without help" when asked to attempt tasks       Exercises      General Comments        Pertinent Vitals/Pain Faces Pain Scale: No hurt    Home Living  Prior Function            PT Goals (current goals can now be found in the care plan section) Acute Rehab PT Goals PT Goal Formulation: Patient unable to participate in goal setting Time For Goal Achievement: 12/22/17 Potential to Achieve Goals: Fair Progress towards PT goals: Progressing toward goals    Frequency    Min 3X/week      PT Plan Current plan remains appropriate    Co-evaluation               AM-PAC PT "6 Clicks" Daily Activity  Outcome Measure  Difficulty turning over in bed (including adjusting bedclothes, sheets and blankets)?: A Little Difficulty moving from lying on back to sitting on the side of the bed? : A Lot Difficulty sitting down on and standing up from a chair with arms (e.g., wheelchair, bedside commode, etc,.)?: A Lot Help needed moving to and from a bed to chair (including a wheelchair)?: A Little Help needed walking in hospital room?: A Little Help needed climbing 3-5 steps with a railing? : A Little 6 Click Score: 16    End of Session Equipment Utilized During Treatment: Gait belt Activity Tolerance: Patient tolerated treatment well Patient left: with call bell/phone within reach;in chair;with chair alarm set Nurse Communication: Mobility status PT Visit Diagnosis: Difficulty in walking, not elsewhere classified (R26.2);Other symptoms and signs involving the nervous system (R29.898);History of falling (Z91.81)     Time: 4287-6811 PT Time Calculation (min) (ACUTE ONLY): 40 min  Charges:  $Gait Training: 8-22 mins $Therapeutic Activity: 23-37 mins                    G Codes:       Earney Navy, PTA Pager: [phone]     Darliss Cheney 12/10/2017, 1:30 PM

## 2017-12-10 NOTE — Progress Notes (Signed)
ANTICOAGULATION CONSULT NOTE - Follow-Up  Pharmacy Consult for Warfarin Indication: atrial fibrillation  Patient Measurements: Height: 5\' 7"  (170.2 cm) Weight: 125 lb 7.1 oz (56.9 kg) IBW/kg (Calculated) : 61.6   Vital Signs: Temp: 98.3 F (36.8 C) (06/17 0805) Temp Source: Oral (06/17 0805) BP: 160/87 (06/17 0805) Pulse Rate: 80 (06/17 0805)  Labs: Recent Labs    12/07/17 2030  12/08/17 0437 12/08/17 0721 12/08/17 1531 12/09/17 1011 12/10/17 0616  HGB 11.0*   < > 10.4*  --   --  10.4* 9.8*  HCT 41.6   < > 37.3  --   --  37.6 35.0*  PLT 443*  --  394  --   --  442* 410*  LABPROT 36.2*  --  33.5*  --   --  25.3* 22.7*  INR 3.67  --  3.33  --   --  2.32 2.02  CREATININE 1.49*   < > 1.37*  --   --  1.27* 1.13*  CKTOTAL 47  --   --   --   --   --   --   TROPONINI 0.07*  --  0.07* 0.07* 0.06*  --   --    < > = values in this interval not displayed.    Estimated Creatinine Clearance: 45.8 mL/min (A) (by C-G formula based on SCr of 1.13 mg/dL (H)).  Assessment: Glenda Johnson on Warfarin PTA for hx Afib. Admit INR 3.67 on PTA dose of 5 mg/day - last dose unknown. Pharmacy consulted to resume dosing this admit.   Warfarin was held on admission d/t elevated INR, but resumed yesterday evening. INR this morning trended down to 2.02 d/t missed doses for elevated INR.  Goal of Therapy:  INR 2-3 Monitor platelets by anticoagulation protocol: Yes   Plan:  - Warfarin 5 mg x 1 tonight  - Daily INR - Follow for s/s bleeding  Thank you for allowing pharmacy to be a part of this patient's care.  Atalia Litzinger D. Dimetri Armitage, PharmD, BCPS Clinical Pharmacist [phone] Please check AMION for all [place] numbers 12/10/2017 11:15 AM

## 2017-12-10 NOTE — Progress Notes (Signed)
PROGRESS NOTE    Glenda Johnson  ZOX:[id number] DOB: [birth year] DOA: 12/07/2017 PCP: [name], MD   Brief Narrative:  Patient 62 year old female history of CHF, COPD, A. fib, recent CVA, recent hospitalization and discharged after being treated for CHF and pneumonia presented to the ED after being found laying on the floor by her sister.  Patient with no recollection of what happened.  Patient noted on admission to be in A. fib with RVR subsequently started on a Cardizem drip.  CT head unremarkable.  Infectious work-up negative.  Patient remains afebrile.  MRI brain unremarkable.  EEG was abnormal and neurology consulted and patient started on anti-epileptic medications.   Assessment & Plan:   Principal Problem:   Seizure ([place]) Active Problems:   Acute on chronic diastolic congestive heart failure (HCC)   Syncope   COPD (chronic obstructive pulmonary disease) ([place])   Cerebrovascular accident (CVA) due to embolism of cerebral artery (HCC)   CKD (chronic kidney disease) stage 3, GFR 30-59 ml/min (HCC)   Hypokalemia   Atrial fibrillation with RVR ([place])  #1 seizures Patient admitted with concerns for syncopal episode.  CT head MRI head unremarkable for any acute abnormalities.  Patient with no infectious etiologies noted as urinalysis unremarkable chest x-ray negative.  EEG which was done normal and concerning for underlying propensity towards seizures.  Neurology was consulted and patient was seen in consultation by Dr. Malen Gauze on 12/09/2017.  It was felt per neurology that EEG was consistent with generalized slowing as well as episode of paroxysmal buildup of fast activity and by frontotemporal regions without clinical correlate which can certainly be epileptiform but not epileptic at this time.  It is noted that patient has multiple intracranial abnormalities including right occipital infarct.  It was felt that patient had both imaging and electrographic abnormalities which would give an  increased propensity for seizures and recommended that patient be started on anti-elliptic medications.  Patient has been started on Keppra per neurology.  Patient will need outpatient follow-up with neurology.  #  #2 syncope Secondary to problem #1.  Orthostatics were negative.  MRI brain negative for any acute abnormalities.  Ammonia levels within normal limits.  TSH within normal limits at 1.900.  RPR was nonreactive.  3.  A. fib with RVR Heart rate controlled.  Cardizem drip has been discontinued.  Continue home regimen Coreg.  INR at 2.02.  Continue Coumadin for anticoagulation.  Pharmacy managing Coumadin.  4.  Acute on chronic diastolic heart failure Patient noted to be volume overloaded on examination.  BNP noted to be greater than 4500.  Patient with no crackles on examination however did have lower extremity edema.  Cardiac enzymes minimally elevated however flattened likely secondary to acute CHF.  Patient with recent 2D echo done 12/01/2017 and as such not repeated.  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  Patient placed on Lasix 40 mg IV every 12 hours.  Patient with a urine output of 1.40 L over the past 24 hours.  Transition back to home regimen of oral Lasix 40 mg twice daily.  Strict I's and O's.  Daily weights.  Continue Coreg, hydralazine.  5.  Gastroesophageal reflux disease PPI.  6.  Hypokalemia Likely secondary to diuresis.  Replete.  7.  Hypertension Continue Coreg, Lasix.  Increase hydralazine to 50 mg p.o. 3 times daily.  8 COPD Stable.  9.  History of CVA MRI head negative for any acute abnormalities, continue Coumadin.     DVT prophylaxis: Coumadin Code Status: Full Family Communication: Updated patient.  No family at bedside. Disposition Plan: Skilled nursing facility versus home with home health  hopefully in the next 24 hours.   Consultants:   Urology: Dr. Rory Percy 12/09/2017  Procedures:   EEG 12/08/2017  CT head without contrast 12/07/2017  MRI head 12/08/2017    Antimicrobials:   None   Subjective: Sitting up in wheelchair.  Denies any chest pain no shortness of breath.  Feeling better.  Alert and following commands.    Objective: Vitals:   12/09/17 2322 12/10/17 0326 12/10/17 0530 12/10/17 0805  BP: (!) 144/85 (!) 157/95 (!) 168/84 (!) 160/87  Pulse: (!) 54 77 68 80  Resp: 16 15  17   Temp: (!) 97.4 F (36.3 C)   98.3 F (36.8 C)  TempSrc: Oral   Oral  SpO2: 93% 92%  94%  Weight:      Height:        Intake/Output Summary (Last 24 hours) at 12/10/2017 0953 Last data filed at 12/10/2017 0650 Gross per 24 hour  Intake 1002.5 ml  Output 1400 ml  Net -397.5 ml   Filed Weights   12/08/17 0200  Weight: 56.9 kg (125 lb 7.1 oz)    Examination:  General exam: NAD Respiratory system: CTAB, no wheezes, no crackles, no rhonchi.  Cardiovascular system: RRR no murmurs rubs or gallops.  1+ bilateral lower extremity edema.  Gastrointestinal system: Abdomen is soft, nontender, nondistended, positive bowel sounds.  No rebound.  No guarding.   Central nervous system: Alert and oriented. No focal neurological deficits. Extremities: Symmetric 5 x 5 power. Skin: No rashes, lesions or ulcers Psychiatry: Judgement and insight appear normal. Mood & affect appropriate.     Data Reviewed: I have personally reviewed following labs and imaging studies  CBC: Recent Labs  Lab 12/07/17 2030 12/07/17 2050 12/08/17 0437 12/09/17 1011 12/10/17 0616  WBC 12.9*  --  10.5 17.0* 14.3*  NEUTROABS 10.2*  --   --  14.2*  --   HGB 11.0* 13.6 10.4* 10.4* 9.8*  HCT 41.6 40.0 37.3 37.6 35.0*  MCV 77.6*  --  75.2* 75.5* 75.4*  PLT 443*  --  394 442* 220*   Basic Metabolic Panel: Recent Labs  Lab 12/04/17 0503 12/05/17 0409 12/07/17 2030 12/07/17 2050 12/08/17 0437  12/09/17 1011 12/09/17 1025 12/10/17 0616  NA 142 140 141 140 141 137  --  136  K 3.1* 3.6 4.1 4.2 3.3* 3.1*  --  3.5  CL 104 102 101 101 103 98*  --  99*  CO2 26 29 24   --  26 26  --  29  GLUCOSE 175* 152* 97 100* 99 255*  --  108*  BUN 28* 30* 30* 38* 30* 24*  --  17  CREATININE 1.13* 1.06* 1.49* 1.20* 1.37* 1.27*  --  1.13*  CALCIUM 8.9 9.0 9.0  --  8.5* 8.5*  --  8.4*  MG 1.9  --   --   --   --  1.7  --  2.2  PHOS  --   --   --   --   --   --  2.6  --    GFR: Estimated Creatinine Clearance: 45.8 mL/min (A) (by C-G formula based on SCr of 1.13 mg/dL (H)). Liver Function Tests: Recent Labs  Lab 12/07/17 2030  12/08/17 0437  AST 26 18  ALT 17 15  ALKPHOS 55 53  BILITOT 2.6* 2.3*  PROT 6.4* 6.0*  ALBUMIN 3.3* 3.1*   No results for input(s): LIPASE, AMYLASE in the last 168 hours. Recent Labs  Lab 12/08/17 0721  AMMONIA 20   Coagulation Profile: Recent Labs  Lab 12/05/17 0409 12/07/17 2030 12/08/17 0437 12/09/17 1011 12/10/17 0616  INR 3.64 3.67 3.33 2.32 2.02   Cardiac Enzymes: Recent Labs  Lab 12/07/17 2030 12/08/17 0437 12/08/17 0721 12/08/17 1531  CKTOTAL 47  --   --   --   TROPONINI 0.07* 0.07* 0.07* 0.06*   BNP (last 3 results) No results for input(s): PROBNP in the last 8760 hours. HbA1C: No results for input(s): HGBA1C in the last 72 hours. CBG: Recent Labs  Lab 12/07/17 1938  GLUCAP 107*   Lipid Profile: No results for input(s): CHOL, HDL, LDLCALC, TRIG, CHOLHDL, LDLDIRECT in the last 72 hours. Thyroid Function Tests: Recent Labs    12/08/17 0721  TSH 1.900   Anemia Panel: Recent Labs    12/08/17 0437 12/08/17 0721  VITAMINB12  --  412  FOLATE  --  9.7  FERRITIN  --  57  TIBC  --  398  IRON  --  21*  RETICCTPCT 1.5  --    Sepsis Labs: No results for input(s): PROCALCITON, LATICACIDVEN in the last 168 hours.  Recent Results (from the past 240 hour(s))  Blood Culture (routine x 2)     Status: None   Collection Time:  11/30/17 11:50 PM  Result Value Ref Range Status   Specimen Description LEFT ANTECUBITAL  Final   Special Requests   Final    BOTTLES DRAWN AEROBIC AND ANAEROBIC Blood Culture adequate volume   Culture   Final    NO GROWTH 5 DAYS Performed at [facility], [address], [place], [place] [address]    Report Status 12/06/2017 FINAL  Final  Blood Culture (routine x 2)     Status: None   Collection Time: 12/01/17 12:03 AM  Result Value Ref Range Status   Specimen Description BLOOD RIGHT FOREARM  Final   Special Requests   Final    BOTTLES DRAWN AEROBIC AND ANAEROBIC Blood Culture adequate volume   Culture   Final    NO GROWTH 5 DAYS Performed at [facility], [address], [place], [place] [address]    Report Status 12/06/2017 FINAL  Final  Urine culture     Status: None   Collection Time: 12/01/17 12:06 AM  Result Value Ref Range Status   Specimen Description   Final    URINE, CLEAN CATCH Performed at [facility], [address], [place], [place] [address]    Special Requests   Final    NONE Performed at [facility], [address], [place], [place] [address]    Culture   Final    NO GROWTH Performed at [facility], [place] [address] [place],  [address]    Report Status 12/02/2017 FINAL  Final  MRSA PCR Screening     Status: Abnormal   Collection Time: 12/01/17  4:04 AM  Result Value Ref Range Status   MRSA by PCR POSITIVE (A) NEGATIVE Final    Comment: RESULT CALLED TO, READ BACK BY AND VERIFIED WITH: [email or web address] @ 1600 ON 6.8.19 BY L.BOWMAN        The GeneXpert MRSA Assay (FDA approved for NASAL specimens only), is one component of a comprehensive MRSA  colonization surveillance program. It is not intended to diagnose MRSA infection nor to guide or monitor treatment for MRSA infections. Performed at [facility], [address] [place], [place] [address]   Respiratory Panel by PCR     Status: None   Collection Time: 12/01/17 12:30 PM  Result Value Ref  Range Status   Adenovirus NOT DETECTED NOT DETECTED Final   Coronavirus 229E NOT DETECTED NOT DETECTED Final   Coronavirus HKU1 NOT DETECTED NOT DETECTED Final   Coronavirus NL63 NOT DETECTED NOT DETECTED Final   Coronavirus OC43 NOT DETECTED NOT DETECTED Final   Metapneumovirus NOT DETECTED NOT DETECTED Final   Rhinovirus / Enterovirus NOT DETECTED NOT DETECTED Final   Influenza A NOT DETECTED NOT DETECTED Final   Influenza B NOT DETECTED NOT DETECTED Final   Parainfluenza Virus 1 NOT DETECTED NOT DETECTED Final   Parainfluenza Virus 2 NOT DETECTED NOT DETECTED Final   Parainfluenza Virus 3 NOT DETECTED NOT DETECTED Final   Parainfluenza Virus 4 NOT DETECTED NOT DETECTED Final   Respiratory Syncytial Virus NOT DETECTED NOT DETECTED Final   Bordetella pertussis NOT DETECTED NOT DETECTED Final   Chlamydophila pneumoniae NOT DETECTED NOT DETECTED Final   Mycoplasma pneumoniae NOT DETECTED NOT DETECTED Final    Comment: Performed at [facility], [place] [address], [place],  [address]         Radiology Studies: No results found.      Scheduled Meds: . carvedilol  6.25 mg Oral BID WC  . fluticasone furoate-vilanterol  1 puff Inhalation Daily   And  . umeclidinium bromide  1 puff Inhalation Daily  . furosemide  40 mg Intravenous BID  . hydrALAZINE  25 mg Oral Q8H  . levETIRAcetam  500 mg Oral BID  . pantoprazole  40 mg Oral Q1200  . potassium chloride SA  20 mEq Oral BID  . potassium chloride  40 mEq Oral Once  . Warfarin - Pharmacist Dosing Inpatient   Does not apply q1800   Continuous Infusions:    LOS: 0 days    Time spent: 40 minutes    Irine Seal, MD [facility] Pager [phone]  If 7PM-7AM, please contact night-coverage [email or web address] Password [facility] 12/10/2017, 9:53 AM

## 2017-12-10 NOTE — Plan of Care (Signed)
Patient stable, discussed POC with patient, denies question/concerns at this time.  

## 2017-12-11 ENCOUNTER — Observation Stay (HOSPITAL_COMMUNITY): Payer: BLUE CROSS/BLUE SHIELD

## 2017-12-11 DIAGNOSIS — I5033 Acute on chronic diastolic (congestive) heart failure: Secondary | ICD-10-CM | POA: Diagnosis not present

## 2017-12-11 DIAGNOSIS — I6340 Cerebral infarction due to embolism of unspecified cerebral artery: Secondary | ICD-10-CM | POA: Diagnosis not present

## 2017-12-11 DIAGNOSIS — I4891 Unspecified atrial fibrillation: Secondary | ICD-10-CM | POA: Diagnosis not present

## 2017-12-11 DIAGNOSIS — R569 Unspecified convulsions: Secondary | ICD-10-CM | POA: Diagnosis not present

## 2017-12-11 DIAGNOSIS — R0902 Hypoxemia: Secondary | ICD-10-CM

## 2017-12-11 LAB — BASIC METABOLIC PANEL
ANION GAP: 11 (ref 5–15)
BUN: 16 mg/dL (ref 6–20)
CALCIUM: 8.9 mg/dL (ref 8.9–10.3)
CO2: 29 mmol/L (ref 22–32)
Chloride: 100 mmol/L — ABNORMAL LOW (ref 101–111)
Creatinine, Ser: 1.23 mg/dL — ABNORMAL HIGH (ref 0.44–1.00)
GFR calc Af Amer: 53 mL/min — ABNORMAL LOW (ref >60)
GFR, EST NON AFRICAN AMERICAN: 46 mL/min — AB (ref >60)
GLUCOSE: 110 mg/dL — AB (ref 65–99)
Potassium: 3.8 mmol/L (ref 3.5–5.1)
Sodium: 140 mmol/L (ref 135–145)

## 2017-12-11 LAB — CBC
HEMATOCRIT: 39.8 % (ref 36.0–46.0)
Hemoglobin: 10.9 g/dL — ABNORMAL LOW (ref 12.0–15.0)
MCH: 21.2 pg — AB (ref 26.0–34.0)
MCHC: 27.4 g/dL — AB (ref 30.0–36.0)
MCV: 77.4 fL — AB (ref 78.0–100.0)
PLATELETS: 410 10*3/uL — AB (ref 150–400)
RBC: 5.14 MIL/uL — ABNORMAL HIGH (ref 3.87–5.11)
RDW: 21.6 % — AB (ref 11.5–15.5)
WBC: 15.8 10*3/uL — ABNORMAL HIGH (ref 4.0–10.5)

## 2017-12-11 LAB — PROTIME-INR
INR: 2.26
Prothrombin Time: 24.8 seconds — ABNORMAL HIGH (ref 11.4–15.2)

## 2017-12-11 MED ORDER — FLUCONAZOLE 150 MG PO TABS
150.0000 mg | ORAL_TABLET | Freq: Once | ORAL | Status: AC
  Administered 2017-12-11: 150 mg via ORAL
  Filled 2017-12-11: qty 1

## 2017-12-11 MED ORDER — CARVEDILOL 12.5 MG PO TABS
12.5000 mg | ORAL_TABLET | Freq: Two times a day (BID) | ORAL | Status: DC
  Administered 2017-12-11 – 2017-12-13 (×6): 12.5 mg via ORAL
  Filled 2017-12-11 (×6): qty 1

## 2017-12-11 MED ORDER — WARFARIN SODIUM 5 MG PO TABS
5.0000 mg | ORAL_TABLET | Freq: Every day | ORAL | Status: DC
  Administered 2017-12-11: 5 mg via ORAL
  Filled 2017-12-11: qty 1

## 2017-12-11 MED ORDER — CLOTRIMAZOLE 1 % VA CREA
1.0000 | TOPICAL_CREAM | Freq: Every day | VAGINAL | Status: DC
  Administered 2017-12-11 – 2017-12-12 (×2): 1 via VAGINAL
  Filled 2017-12-11: qty 45

## 2017-12-11 NOTE — Progress Notes (Signed)
12/11/2017 spoke with PTA who has been talking with d/c planning team.  This pt does not have consistent help at discharge and is at risk for falling and injuring herself both from a physical and cognitive/safety standpoint.  She would benefit from post acute rehab at [facility] before returning to a more independent lifestyle.  PT will continue to follow acutely and work on safe mobility progression.   THanks,  Wells Guiles B. [place], [place], DPT [phone]

## 2017-12-11 NOTE — Progress Notes (Signed)
PROGRESS NOTE    Glenda Johnson  QVZ:[id number] DOB: 12/29/1955 DOA: 12/07/2017 PCP: Glenda Housekeeper, MD   Brief Narrative:  Patient 62 year old female history of CHF, COPD, A. fib, recent CVA, recent hospitalization and discharged after being treated for CHF and pneumonia presented to the ED after being found laying on the floor by her sister.  Patient with no recollection of what happened.  Patient noted on admission to be in A. fib with RVR subsequently started on a Cardizem drip.  CT head unremarkable.  Infectious work-up negative.  Patient remains afebrile.  MRI brain unremarkable.  EEG was abnormal and neurology consulted and patient started on anti-epileptic medications.   Assessment & Plan:   Principal Problem:   Seizure ([place]) Active Problems:   Acute on chronic diastolic congestive heart failure (HCC)   Syncope   COPD (chronic obstructive pulmonary disease) ([place])   Cerebrovascular accident (CVA) due to embolism of cerebral artery (HCC)   CKD (chronic kidney disease) stage 3, GFR 30-59 ml/min (HCC)   Hypokalemia   Atrial fibrillation with RVR ([place])  #1 seizures Patient admitted with concerns for syncopal episode.  CT head MRI head unremarkable for any acute abnormalities.  Patient with no infectious etiologies noted as urinalysis unremarkable chest x-ray negative.  EEG which was done normal and concerning for underlying propensity towards seizures.  Neurology was consulted and patient was seen in consultation by Dr. Malen Gauze on 12/09/2017.  It was felt per neurology that EEG was consistent with generalized slowing as well as episode of paroxysmal buildup of fast activity and by frontotemporal regions without clinical correlate which can certainly be epileptiform but not epileptic at this time.  It is noted that patient has multiple intracranial abnormalities including right occipital infarct.  It was felt that patient had both imaging and electrographic abnormalities which would give an  increased propensity for seizures and recommended that patient be started on anti-elliptic medications.  Patient has been started on Keppra per neurology.  Patient will need outpatient follow-up with neurology.  #  #2 syncope Secondary to problem #1.  Orthostatics were negative.  MRI brain negative for any acute abnormalities.  Ammonia levels within normal limits.  TSH within normal limits at 1.900.  RPR was nonreactive.  3.  A. fib with RVR Heart rate controlled on Coreg.  Cardizem drip has been discontinued.  INR 2.26.  Coumadin for anticoagulation.  Pharmacy managing Coumadin.    4.  Acute on chronic diastolic heart failure Patient noted to be volume overloaded on examination.  BNP noted to be greater than 4500.  Patient with no crackles on examination however did have lower extremity edema.  Patient also with decreased breath sounds in the bases.  Cardiac enzymes minimally elevated however flattened likely secondary to acute CHF.  Patient with recent 2D echo done 12/01/2017 and as such not repeated.  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  Patient placed on Lasix 40 mg IV every 12 hours and has been transitioned back to home dose oral Lasix.  Urine output not accurately recorded.  Strict I's and O's.  Daily weights.  Continue Coreg, hydralazine.  5.  Gastroesophageal reflux disease Continue PPI.  6.  Hypokalemia Likely secondary to diuresis.  Repleted.  7.  Hypertension Continue Coreg, Lasix.  Increased hydralazine to 50 mg p.o. 3 times daily for better blood pressure control.  8 COPD Stable.  9.  History of CVA MRI head negative for any acute abnormalities, continue Coumadin for secondary stroke prophylaxis.  INR therapeutic.[name]  10.  Hypoxia Per RN patient noted to be hypoxic with sats in the 80s on room air this morning and  difficult to arouse.  Patient given a nebulizer treatment.  Patient seen and assessed with decreased breath sounds in the bases.  Check a chest x-ray.  Repeat O2 sats on room air 95%.  Follow.     DVT prophylaxis: Coumadin Code Status: Full Family Communication: Updated patient.  No family at bedside. Disposition Plan: Skilled nursing facility when bed available hopefully in the next 24 hours.    Consultants:   Urology: Dr. Rory Percy 12/09/2017  Procedures:   EEG 12/08/2017  CT head without contrast 12/07/2017  MRI head 12/08/2017  Chest x-ray 12/11/2017  Antimicrobials:   None   Subjective: She is in bed sleeping however easily arousable.  Patient receiving breathing treatment.  Alert and oriented x3.  Patient states did not get significant sleep prior to admission and was making up on his sleep overnight.  Denies any chest pain or shortness of breath.  Per RN patient noted to be hypoxic with sats of about 87% this morning on room air and difficult to arouse.  Patient had to be placed on nasal cannula due to hypoxia.  No syncopal episodes or seizures noted.  Objective: Vitals:   12/10/17 2004 12/10/17 2337 12/11/17 0324 12/11/17 0756  BP: (!) 162/83 (!) 167/94 (!) 158/95 (!) 158/89  Pulse: 78 80 82 (!) 117  Resp: 13 13 14 15   Temp: (!) 97.5 F (36.4 C) 97.6 F (36.4 C) 97.6 F (36.4 C) 98.2 F (36.8 C)  TempSrc: Oral Oral Oral   SpO2: 92% 94% 92% 93%  Weight:      Height:        Intake/Output Summary (Last 24 hours) at 12/11/2017 0907 Last data filed at 12/11/2017 0324 Gross per 24 hour  Intake 240 ml  Output 950 ml  Net -710 ml   Filed Weights   12/08/17 0200  Weight: 56.9 kg (125 lb 7.1 oz)    Examination:  General exam: NAD Respiratory system: Decreased breath sounds in the bases.  No wheezes, no rhonchi.   Cardiovascular system: RRR no murmurs rubs or gallops. Trace bilateral lower extremity edema.  Gastrointestinal system: Abdomen is nontender,  nondistended, soft, positive bowel sounds.  No rebound.  No guarding.  Central nervous system: Alert and oriented. No focal neurological deficits. Extremities: Symmetric 5 x 5 power. Skin: No rashes, lesions or ulcers Psychiatry: Judgement and insight appear normal. Mood & affect appropriate.     Data Reviewed: I have personally reviewed following labs and imaging studies  CBC: Recent Labs  Lab 12/07/17 2030 12/07/17 2050 12/08/17 0437 12/08/17 0721 12/09/17 1011 12/10/17 0616 12/11/17 0622  WBC 12.9*  --  10.5  --  17.0* 14.3* 15.8*  NEUTROABS 10.2*  --   --   --  14.2*  --   --   HGB 11.0* 13.6 10.4*  --  10.4* 9.8* 10.9*  HCT 41.6 40.0 37.3 36.1 37.6 35.0* 39.8  MCV 77.6*  --  75.2*  --  75.5* 75.4* 77.4*  PLT 443*  --  394  --  442* 410* 456*   Basic Metabolic Panel: Recent Labs  Lab 12/07/17 2030 12/07/17 2050 12/08/17 0437 12/09/17 1011 12/09/17 1025 12/10/17 0616 12/11/17 0622  NA 141 140 141 137  --  136 140  K 4.1 4.2 3.3* 3.1*  --  3.5 3.8  CL 101 101 103 98*  --  99* 100*  CO2 24  --  26 26  --  29 29  GLUCOSE 97 100* 99 255*  --  108* 110*  BUN 30* 38* 30* 24*  --  17 16  CREATININE 1.49* 1.20* 1.37* 1.27*  --  1.13* 1.23*  CALCIUM 9.0  --  8.5* 8.5*  --  8.4* 8.9  MG  --   --   --  1.7  --  2.2  --   PHOS  --   --   --   --  2.6  --   --    GFR: Estimated Creatinine Clearance: 42.1 mL/min (A) (by C-G formula based on SCr of 1.23 mg/dL (H)). Liver Function Tests: Recent Labs  Lab 12/07/17 2030 12/08/17 0437  AST 26 18  ALT 17 15  ALKPHOS 55 53  BILITOT 2.6* 2.3*  PROT 6.4* 6.0*  ALBUMIN 3.3* 3.1*   No results for input(s): LIPASE, AMYLASE in the last 168 hours. Recent Labs  Lab 12/08/17 0721  AMMONIA 20   Coagulation Profile: Recent Labs  Lab 12/07/17 2030 12/08/17 0437 12/09/17 1011 12/10/17 0616 12/11/17 0622  INR 3.67 3.33 2.32 2.02 2.26   Cardiac Enzymes: Recent Labs  Lab 12/07/17 2030 12/08/17 0437 12/08/17 0721  12/08/17 1531  CKTOTAL 47  --   --   --   TROPONINI 0.07* 0.07* 0.07* 0.06*   BNP (last 3 results) No results for input(s): PROBNP in the last 8760 hours. HbA1C: No results for input(s): HGBA1C in the last 72 hours. CBG: Recent Labs  Lab 12/07/17 1938  GLUCAP 107*   Lipid Profile: No results for input(s): CHOL, HDL, LDLCALC, TRIG, CHOLHDL, LDLDIRECT in the last 72 hours. Thyroid Function Tests: No results for input(s): TSH, T4TOTAL, FREET4, T3FREE, THYROIDAB in the last 72 hours. Anemia Panel: No results for input(s): VITAMINB12, FOLATE, FERRITIN, TIBC, IRON, RETICCTPCT in the last 72 hours. Sepsis Labs: No results for input(s): PROCALCITON, LATICACIDVEN in the last 168 hours.  Recent Results (from the past 240 hour(s))  Respiratory Panel by PCR     Status: None   Collection Time: 12/01/17 12:30 PM  Result Value Ref Range Status   Adenovirus NOT DETECTED NOT DETECTED Final   Coronavirus 229E NOT DETECTED NOT DETECTED Final   Coronavirus HKU1 NOT DETECTED NOT DETECTED Final   Coronavirus NL63 NOT DETECTED NOT DETECTED Final   Coronavirus OC43 NOT DETECTED NOT DETECTED Final   Metapneumovirus NOT DETECTED NOT DETECTED Final   Rhinovirus / Enterovirus NOT DETECTED NOT DETECTED Final   Influenza A NOT DETECTED NOT DETECTED Final   Influenza B NOT DETECTED NOT DETECTED Final   Parainfluenza Virus 1 NOT DETECTED NOT DETECTED Final   Parainfluenza Virus 2 NOT DETECTED NOT DETECTED Final   Parainfluenza Virus 3 NOT DETECTED NOT DETECTED Final   Parainfluenza Virus 4 NOT DETECTED NOT DETECTED Final   Respiratory Syncytial Virus NOT DETECTED NOT DETECTED Final   Bordetella pertussis NOT DETECTED NOT DETECTED Final   Chlamydophila pneumoniae NOT DETECTED NOT DETECTED Final   Mycoplasma pneumoniae NOT DETECTED NOT DETECTED Final    Comment: Performed at [facility], [place]. [address], [place], [place] [address]         Radiology Studies: No results  found.      Scheduled Meds: . carvedilol  12.5 mg Oral BID WC  . fluticasone  furoate-vilanterol  1 puff Inhalation Daily   And  . umeclidinium bromide  1 puff Inhalation Daily  . furosemide  40 mg Oral BID  . hydrALAZINE  50 mg Oral Q8H  . levETIRAcetam  500 mg Oral BID  . pantoprazole  40 mg Oral Q1200  . potassium chloride SA  20 mEq Oral BID  . Warfarin - Pharmacist Dosing Inpatient   Does not apply q1800   Continuous Infusions:    LOS: 0 days    Time spent: 40 minutes    Irine Seal, MD [facility] Pager [phone]  If 7PM-7AM, please contact night-coverage [email or web address] Password [email or web address] 12/11/2017, 9:07 AM

## 2017-12-11 NOTE — Care Management Note (Signed)
Case Management Note  Patient Details  Name: Glenda Johnson MRN: [id number] Date of Birth: 10/11/1955  Subjective/Objective:       Pt admitted with seizure. She is from home alone.             Action/Plan: PT/OT recommending SNF. CSW aware. CM following for d/c disposition.  Expected Discharge Date:                  Expected Discharge Plan:  Skilled Nursing Facility  In-House Referral:  Clinical Social Work  Discharge planning Services     Post Acute Care Choice:    Choice offered to:     DME Arranged:    DME Agency:     HH Arranged:    [place] Agency:     Status of Service:  In process, will continue to follow  If discussed at Long Length of Stay Meetings, dates discussed:    Additional Comments:  Pollie Friar, RN 12/11/2017, 1:53 PM

## 2017-12-11 NOTE — Progress Notes (Signed)
  Speech Language Pathology Treatment: Cognitive-Linquistic  Patient Details Name: Glenda Johnson MRN: [id number] DOB: Jan 12, 1956 Today's Date: 12/11/2017 Time: 8315-1761 SLP Time Calculation (min) (ACUTE ONLY): 18 min  Assessment / Plan / Recommendation Clinical Impression  Pt was seen for skilled ST targeting cognitive goals.  Pt remains tangential in her verbal output and needs mod cues for redirection to topic at hand.  Pt also continues to have poor awareness of her new deficits.  Pt endorses that she has poor memory due to history of CVA; however, she needed max assist to identify her mobility as a deficit despite report from OT and PT that pt remains at high risk of falls.  Pt did have good recall of plan to discharge to SNF and had selected [facility] in [place] as her first choice of facilities.  Pt was left in bed with bed alarm set and call bell within reach.  Continue per current plan of care.    HPI HPI: Glenda Johnson is a 62 y.o. female with history of CHF, COPD, atrial fibrillation, stroke who was recently admitted to [facility] and discharged after being treated for CHF and pneumonia 3 days ago was brought to the ER after patient was found to be lying on the floor. MRI showed no acute abnormality. Chronic infarct right occipital parietal lobe and mild chronic microvascular ischemia.      SLP Plan  Continue with current plan of care       Recommendations                   Follow up Recommendations: Skilled Nursing facility SLP Visit Diagnosis: Cognitive communication deficit (Y07.371) Plan: Continue with current plan of care       GO           Functional Assessment Tool Used: cognitive linguistic treatment  Functional Limitations: Memory Memory Current Status (G6269): At least 60 percent but less than 80 percent impaired, limited or restricted Memory Goal Status (S8546): At least 40 percent but less than 60 percent impaired, limited or restricted     Emilio Math 12/11/2017, 3:52 PM

## 2017-12-11 NOTE — Progress Notes (Signed)
ANTICOAGULATION CONSULT NOTE - Follow-Up  Pharmacy Consult for Warfarin Indication: atrial fibrillation  Patient Measurements: Height: 5\' 7"  (170.2 cm) Weight: 125 lb 7.1 oz (56.9 kg) IBW/kg (Calculated) : 61.6   Vital Signs: Temp: 98.2 F (36.8 C) (06/18 0756) Temp Source: Oral (06/18 0324) BP: 158/89 (06/18 0756) Pulse Rate: 117 (06/18 0756)  Labs: Recent Labs    12/08/17 1531  12/09/17 1011 12/10/17 0616 12/11/17 0622  HGB  --    < > 10.4* 9.8* 10.9*  HCT  --   --  37.6 35.0* 39.8  PLT  --   --  442* 410* 410*  LABPROT  --   --  25.3* 22.7* 24.8*  INR  --   --  2.32 2.02 2.26  CREATININE  --   --  1.27* 1.13* 1.23*  TROPONINI 0.06*  --   --   --   --    < > = values in this interval not displayed.    Estimated Creatinine Clearance: 42.1 mL/min (A) (by C-G formula based on SCr of 1.23 mg/dL (H)).  Assessment: 63 YOF on Warfarin PTA for hx Afib. Admit INR 3.67 on PTA dose of 5 mg/day - last dose unknown.  Warfarin was held on admission d/t elevated INR, but resumed 6/16 evening when INR was back in goal range.  INR today remains therapeutic at 2.26. CBC stable, no bleeding noted.  Goal of Therapy:  INR 2-3 Monitor platelets by anticoagulation protocol: Yes   Plan:  - Warfarin 5 mg daily- restarting home dose as INR stable - Daily INR - Follow for s/s bleeding  Thank you for allowing pharmacy to be a part of this patient's care.  Talmadge Ganas D. Trinity Hyland, PharmD, BCPS Clinical Pharmacist [phone] Please check AMION for all [place] numbers 12/11/2017 10:33 AM

## 2017-12-11 NOTE — Progress Notes (Addendum)
Occupational Therapy Treatment Patient Details Name: Glenda Johnson MRN: [id number] DOB: 04-Dec-1955 Today's Date: 12/11/2017    History of present illness 61 y.o. female admitted on 12/07/17 for confusion.  Pt found to be lying in the floor at home by her sister.  CT of head with no acute events, MRI is negative for acute events.  Pt found to be in A-fib with RVR.  MRI whoed remote Rt occipital inarct, but no acute changes.  Dx:  Likely seizure due to abnormal EEG.   Pt with other significant PMH of recent stroke (March), HTN, Hepatitis C, COPD, CHF, A-fib, and back surgery.     OT comments  Pt progressing towards acute OT goals. Initially indicating refusal but after discussion able to agree on goal for session (stand EOB and toilet transfer). Pt easily irritated. D/c plan remains appropriate.   Follow Up Recommendations  SNF;Supervision/Assistance - 24 hour    Equipment Recommendations  3 in 1 bedside commode;Tub/shower bench    Recommendations for Other Services      Precautions / Restrictions Precautions Precautions: Fall Restrictions Weight Bearing Restrictions: No       Mobility Bed Mobility Overal bed mobility: Needs Assistance Bed Mobility: Supine to Sit;Sit to Supine     Supine to sit: Min assist Sit to supine: Min guard;HOB elevated   General bed mobility comments: Pt using therapist arms to powerup. sequencing dfficulty.  Transfers Overall transfer level: Needs assistance Equipment used: Rolling walker (2 wheeled) Transfers: Sit to/from [facility] Sit to Stand: Min guard;Min assist Stand pivot transfers: Min guard       General transfer comment: pt able to stand and pivot without physical assistance from EOB and recliner and min A to power up from low commode with use of grab bar    Balance Overall balance assessment: Needs assistance Sitting-balance support: Feet supported Sitting balance-Leahy Scale: Fair     Standing balance support:  No upper extremity supported Standing balance-Leahy Scale: Fair                             ADL either performed or assessed with clinical judgement   ADL Overall ADL's : Needs assistance/impaired                         Toilet Transfer: Minimal assistance;Stand-pivot;BSC;RW   Toileting- Clothing Manipulation and Hygiene: Moderate assistance;Sit to/from stand Toileting - Clothing Manipulation Details (indicate cue type and reason): pt stood, therapist completed pericare     Functional mobility during ADLs: (declined) General ADL Comments: Pt completed bed mobility, stood about a minute, toilet transfer, and pericare.     Vision   Vision Assessment?: Vision impaired- to be further tested in functional context   Perception     Praxis      Cognition Arousal/Alertness: Awake/alert Behavior During Therapy: Anxious;Flat affect Overall Cognitive Status: No family/caregiver present to determine baseline cognitive functioning Area of Impairment: Attention;Following commands;Safety/judgement;Awareness;Problem solving                   Current Attention Level: Sustained   Following Commands: Follows one step commands consistently Safety/Judgement: Decreased awareness of safety   Problem Solving: Requires verbal cues General Comments: easily agitated. Benefits from reduced distractions, one step commands. Decreased recall noted during session.         Exercises     Shoulder Instructions       General Comments  Pertinent Vitals/ Pain       Pain Assessment: No/denies pain  Home Living                                          Prior Functioning/Environment              Frequency  Min 2X/week        Progress Toward Goals  OT Goals(current goals can now be found in the care plan section)  Progress towards OT goals: Progressing toward goals  Acute Rehab OT Goals Patient Stated Goal: Did not state.   OT Goal  Formulation: With patient Time For Goal Achievement: 12/23/17 Potential to Achieve Goals: Good ADL Goals Pt Will Perform Grooming: with supervision;standing Pt Will Perform Lower Body Bathing: with supervision;sit to/from stand Pt Will Perform Lower Body Dressing: with supervision;sit to/from stand Pt Will Transfer to Toilet: with supervision;ambulating;regular height toilet;grab bars Pt Will Perform Toileting - Clothing Manipulation and hygiene: with supervision;sit to/from stand  Plan Discharge plan remains appropriate    Co-evaluation                 AM-PAC PT "6 Clicks" Daily Activity     Outcome Measure   Help from another person eating meals?: None Help from another person taking care of personal grooming?: A Little Help from another person toileting, which includes using toliet, bedpan, or urinal?: A Little Help from another person bathing (including washing, rinsing, drying)?: A Lot Help from another person to put on and taking off regular upper body clothing?: A Little Help from another person to put on and taking off regular lower body clothing?: A Lot 6 Click Score: 17    End of Session Equipment Utilized During Treatment: Rolling walker;Gait belt  OT Visit Diagnosis: Unsteadiness on feet (R26.81)   Activity Tolerance Patient tolerated treatment well   Patient Left in bed;with bed alarm set;with call bell/phone within reach   Nurse Communication          Time: 1350-1425 OT Time Calculation (min): 35 min  Charges: OT General Charges $OT Visit: 1 Visit OT Treatments $Self Care/Home Management : 23-37 mins     Hortencia Pilar 12/11/2017, 2:39 PM

## 2017-12-11 NOTE — NC FL2 (Signed)
[place] LEVEL OF CARE SCREENING TOOL     IDENTIFICATION  Patient Name: Glenda Johnson Birthdate: 01/24/1956 Sex: female Admission Date (Current Location): 12/07/2017  [facility] and [place] Number:  Herbalist and Address:  [facility], [employer] [address], [place], [place] [address]      Provider Number: [id number]  Attending Physician Name and Address:  Eugenie Filler, MD  Relative Name and Phone Number:       Current Level of Care: Hospital Recommended Level of Care: [place] Prior Approval Number:    Date Approved/Denied:   PASRR Number: [id number]  Discharge Plan: SNF    Current Diagnoses: Patient Active Problem List   Diagnosis Date Noted  . Seizure ([place]) 12/09/2017  . Syncope 12/08/2017  . HCAP (healthcare-associated pneumonia) 12/01/2017  . Acute on chronic diastolic congestive heart failure ([place]) 12/01/2017  . Lactic acidosis 12/01/2017  . Acute on chronic respiratory failure with hypoxia ([place]) 12/01/2017  . Sepsis due to undetermined organism ([place]) 12/01/2017  . LV (left ventricular) mural thrombus without MI 12/01/2017  . Acute metabolic encephalopathy 36/62/9476  . Severe sepsis ([place]) 11/01/2017  . Tobacco abuse   . Renal artery stenosis ([place])   . Stroke ([place])   . Secondary hypertension   . COPD exacerbation ([place]) 10/31/2017  . Atrial fibrillation with RVR ([place])   . CKD (chronic kidney disease) stage 3, GFR 30-59 ml/min (HCC) 10/09/2017  . Hypokalemia 10/09/2017  . Dyspnea 10/09/2017  . CHF (congestive heart failure) ([place]) 10/09/2017  . Cerebral thrombosis with cerebral infarction 09/20/2017  . Congestive heart failure ([place])   . Cerebrovascular accident (CVA) due to embolism of cerebral artery ([place])   . Cardiac mass   . Anticoagulation adequate   . Flash pulmonary edema ([place]) 09/15/2017  . Community acquired pneumonia   . Abnormal echocardiogram   . Hypoxia 05/22/2016  . Acute  respiratory failure with hypoxia ([place]) 05/22/2016  . Acute respiratory failure ([place]) 05/22/2016  . Sepsis ([place]) 05/22/2016  . Elevated troponin 05/22/2016  . COPD (chronic obstructive pulmonary disease) ([place]) 05/22/2016  . AKI (acute kidney injury) ([place]) 05/22/2016  . Acute pulmonary edema (HCC)     Orientation RESPIRATION BLADDER Height & Weight     Self, Time, Situation, Place  O2([place] 2L) Incontinent, Continent Weight: 125 lb 7.1 oz (56.9 kg) Height:  5\' 7"  (170.2 cm)  BEHAVIORAL SYMPTOMS/MOOD NEUROLOGICAL BOWEL NUTRITION STATUS      Incontinent, Continent Diet(heart healthy)  AMBULATORY STATUS COMMUNICATION OF NEEDS Skin   Limited Assist Verbally Normal                       Personal Care Assistance Level of Assistance  Bathing, Feeding, Dressing Bathing Assistance: Limited assistance Feeding assistance: Limited assistance Dressing Assistance: Limited assistance     Functional Limitations Info  Sight, Hearing, Speech Sight Info: Adequate Hearing Info: Adequate Speech Info: Adequate    SPECIAL CARE FACTORS FREQUENCY  PT (By licensed PT), OT (By licensed OT)     PT Frequency: 5x/wk OT Frequency: 5x/wk            Contractures Contractures Info: Not present    Additional Factors Info  Code Status, Allergies, Isolation Precautions Code Status Info: Full Allergies Info: Codeine, Tramadol, Clarithromycin, Citalopram, Fish Allergy, Other, Wellbutrin Bupropion     Isolation Precautions Info: Contact precautions, MRSA     Current Medications (12/11/2017):  This is the current hospital active medication list  Current Facility-Administered Medications  Medication Dose Route Frequency Provider Last Rate Last Dose  . acetaminophen (TYLENOL) tablet 650 mg  650 mg Oral Q4H PRN Rise Patience, MD   650 mg at 12/10/17 1130   Or  . acetaminophen (TYLENOL) solution 650 mg  650 mg Per Tube Q4H PRN Rise Patience, MD       Or  . acetaminophen (TYLENOL)  suppository 650 mg  650 mg Rectal Q4H PRN Rise Patience, MD      . albuterol (PROVENTIL) (2.5 MG/3ML) 0.083% nebulizer solution 2.5 mg  2.5 mg Nebulization Q6H PRN Rise Patience, MD   2.5 mg at 12/11/17 0848  . carvedilol (COREG) tablet 12.5 mg  12.5 mg Oral BID WC Eugenie Filler, MD   12.5 mg at 12/11/17 0934  . fluticasone furoate-vilanterol (BREO ELLIPTA) 100-25 MCG/INH 1 puff  1 puff Inhalation Daily Rise Patience, MD   1 puff at 12/11/17 0840   And  . umeclidinium bromide (INCRUSE ELLIPTA) 62.5 MCG/INH 1 puff  1 puff Inhalation Daily Rise Patience, MD      . furosemide (LASIX) tablet 40 mg  40 mg Oral BID Eugenie Filler, MD   40 mg at 12/11/17 0934  . hydrALAZINE (APRESOLINE) tablet 50 mg  50 mg Oral Q8H Eugenie Filler, MD   50 mg at 12/11/17 1007  . levETIRAcetam (KEPPRA) tablet 500 mg  500 mg Oral BID Marliss Coots, PA-C   500 mg at 12/11/17 1219  . nitroGLYCERIN (NITROSTAT) SL tablet 0.4 mg  0.4 mg Sublingual Q5 min PRN Rise Patience, MD      . pantoprazole (PROTONIX) EC tablet 40 mg  40 mg Oral Q1200 Rise Patience, MD   40 mg at 12/10/17 1200  . potassium chloride SA (K-DUR,KLOR-CON) CR tablet 20 mEq  20 mEq Oral BID Rise Patience, MD   20 mEq at 12/11/17 0934  . warfarin (COUMADIN) tablet 5 mg  5 mg Oral q1800 Bajbus, Lauren D, RPH      . Warfarin - Pharmacist Dosing Inpatient   Does not apply [id number] Rise Patience, MD         Discharge Medications: Please see discharge summary for a list of discharge medications.  Relevant Imaging Results:  Relevant Lab Results:   Additional Information SS#: [id number]  Geralynn Ochs, LCSW

## 2017-12-12 DIAGNOSIS — R569 Unspecified convulsions: Secondary | ICD-10-CM | POA: Diagnosis not present

## 2017-12-12 DIAGNOSIS — I4891 Unspecified atrial fibrillation: Secondary | ICD-10-CM | POA: Diagnosis not present

## 2017-12-12 DIAGNOSIS — N183 Chronic kidney disease, stage 3 (moderate): Secondary | ICD-10-CM | POA: Diagnosis not present

## 2017-12-12 DIAGNOSIS — J449 Chronic obstructive pulmonary disease, unspecified: Secondary | ICD-10-CM | POA: Diagnosis not present

## 2017-12-12 LAB — BASIC METABOLIC PANEL
ANION GAP: 7 (ref 5–15)
BUN: 18 mg/dL (ref 6–20)
CALCIUM: 8.7 mg/dL — AB (ref 8.9–10.3)
CO2: 28 mmol/L (ref 22–32)
Chloride: 102 mmol/L (ref 101–111)
Creatinine, Ser: 1.32 mg/dL — ABNORMAL HIGH (ref 0.44–1.00)
GFR calc non Af Amer: 42 mL/min — ABNORMAL LOW (ref >60)
GFR, EST AFRICAN AMERICAN: 49 mL/min — AB (ref >60)
Glucose, Bld: 122 mg/dL — ABNORMAL HIGH (ref 65–99)
Potassium: 4.2 mmol/L (ref 3.5–5.1)
Sodium: 137 mmol/L (ref 135–145)

## 2017-12-12 LAB — PROTIME-INR
INR: 2.78
PROTHROMBIN TIME: 29.1 s — AB (ref 11.4–15.2)

## 2017-12-12 MED ORDER — LORAZEPAM 0.5 MG PO TABS
0.5000 mg | ORAL_TABLET | Freq: Once | ORAL | Status: AC
  Administered 2017-12-12: 0.5 mg via ORAL
  Filled 2017-12-12: qty 1

## 2017-12-12 MED ORDER — WARFARIN SODIUM 2.5 MG PO TABS
2.5000 mg | ORAL_TABLET | Freq: Once | ORAL | Status: AC
  Administered 2017-12-12: 2.5 mg via ORAL
  Filled 2017-12-12: qty 1

## 2017-12-12 NOTE — Progress Notes (Signed)
PROGRESS NOTE    Glenda Johnson  QZR:[id number] DOB: [birth year] DOA: 12/07/2017 PCP: [name], MD      Brief Narrative:  Glenda Johnson is a 61 y.o. F with CHF, COPD, A. fib, recent CVA, recent hospitalization and discharged after being treated for CHF and pneumonia presented to the ED after being found laying on the floor by her sister.  Patient with no recollection of what happened.  Patient noted on admission to be in A. fib with RVR subsequently started on a Cardizem drip.  CT head unremarkable.  Infectious work-up negative.  Patient remains afebrile.  MRI brain unremarkable.  EEG was abnormal and neurology consulted and patient started on anti-epileptic medications.     Assessment & Plan:  Seizure -Continue Keppra  Afib with RVR -Continue carvedilol -Continue warfarin  Acute on chronic diastolic CHF -Continue BB, hydralazine -Continue furosemide, K  GER -Continue PPI  Hypertension -Continue hydralazine, BB  COPD -Continue inhalers  Vaginitis -Continue clotrimazole  Hypoxia Resolved.   DVT prophylaxis: Warfarin Code Status: FULL Family Communication: None present MDM and disposition Plan: The below labs and imaging reports were reviewed and summarized above.    The patient was admitted with seizure, started on AED>  Also found to have CHF and Afib.   Consultants:   Neurology  Procedures:   EEG  Antimicrobials:   None    Subjective: Feeling well.  Good appetite.  No confusion. No fever, chest pain, dyspnea, tachycardia.  Objective: Vitals:   12/12/17 0746 12/12/17 0829 12/12/17 1141 12/12/17 1546  BP: (!) 178/94  (!) 139/96 (!) 152/60  Pulse:  76 71 68  Resp: 18  18 18   Temp: 98.1 F (36.7 C)  97.8 F (36.6 C) 97.8 F (36.6 C)  TempSrc: Oral  Oral Axillary  SpO2: 97%  98% 96%  Weight:      Height:       No intake or output data in the 24 hours ending 12/12/17 1809 Filed Weights   12/08/17 0200  Weight: 56.9 kg (125 lb 7.1 oz)     Examination: General appearance:  adult female, alert and in no acute distress.   HEENT: Anicteric, conjunctiva pink, lids and lashes normal. No nasal deformity, discharge, epistaxis.  Lips moist.   Skin: Warm and dry.  no jaundice.  No suspicious rashes or lesions. Cardiac: RRR, nl S1-S2, no murmurs appreciated.  Capillary refill is brisk.  JVP normal.  No LE edema.  Radia  pulses 2+ and symmetric. Respiratory: Normal respiratory rate and rhythm.  CTAB without rales or wheezes. Abdomen: Abdomen soft.  no TTP. No ascites, distension, hepatosplenomegaly.   MSK: No deformities or effusions. Neuro: Awake and alert.  EOMI, moves all extremities. Speech fluent.    Psych: Sensorium intact and responding to questions, attention normal. Affect normal.  Judgment and insight appear normal.    Data Reviewed: I have personally reviewed following labs and imaging studies:  CBC: Recent Labs  Lab 12/07/17 2030 12/07/17 2050 12/08/17 0437 12/08/17 0721 12/09/17 1011 12/10/17 0616 12/11/17 0622  WBC 12.9*  --  10.5  --  17.0* 14.3* 15.8*  NEUTROABS 10.2*  --   --   --  14.2*  --   --   HGB 11.0* 13.6 10.4*  --  10.4* 9.8* 10.9*  HCT 41.6 40.0 37.3 36.1 37.6 35.0* 39.8  MCV 77.6*  --  75.2*  --  75.5* 75.4* 77.4*  PLT 443*  --  394  --  442* 410* 410*  Basic Metabolic Panel: Recent Labs  Lab 12/08/17 0437 12/09/17 1011 12/09/17 1025 12/10/17 0616 12/11/17 0622 12/12/17 0632  NA 141 137  --  136 140 137  K 3.3* 3.1*  --  3.5 3.8 4.2  CL 103 98*  --  99* 100* 102  CO2 26 26  --  29 29 28   GLUCOSE 99 255*  --  108* 110* 122*  BUN 30* 24*  --  17 16 18   CREATININE 1.37* 1.27*  --  1.13* 1.23* 1.32*  CALCIUM 8.5* 8.5*  --  8.4* 8.9 8.7*  MG  --  1.7  --  2.2  --   --   PHOS  --   --  2.6  --   --   --    GFR: Estimated Creatinine Clearance: 39.2 mL/min (A) (by C-G formula based on SCr of 1.32 mg/dL (H)). Liver Function Tests: Recent Labs  Lab 12/07/17 2030 12/08/17 0437    AST 26 18  ALT 17 15  ALKPHOS 55 53  BILITOT 2.6* 2.3*  PROT 6.4* 6.0*  ALBUMIN 3.3* 3.1*   No results for input(s): LIPASE, AMYLASE in the last 168 hours. Recent Labs  Lab 12/08/17 0721  AMMONIA 20   Coagulation Profile: Recent Labs  Lab 12/08/17 0437 12/09/17 1011 12/10/17 0616 12/11/17 0622 12/12/17 0632  INR 3.33 2.32 2.02 2.26 2.78   Cardiac Enzymes: Recent Labs  Lab 12/07/17 2030 12/08/17 0437 12/08/17 0721 12/08/17 1531  CKTOTAL 47  --   --   --   TROPONINI 0.07* 0.07* 0.07* 0.06*   BNP (last 3 results) No results for input(s): PROBNP in the last 8760 hours. HbA1C: No results for input(s): HGBA1C in the last 72 hours. CBG: Recent Labs  Lab 12/07/17 1938  GLUCAP 107*   Lipid Profile: No results for input(s): CHOL, HDL, LDLCALC, TRIG, CHOLHDL, LDLDIRECT in the last 72 hours. Thyroid Function Tests: No results for input(s): TSH, T4TOTAL, FREET4, T3FREE, THYROIDAB in the last 72 hours. Anemia Panel: No results for input(s): VITAMINB12, FOLATE, FERRITIN, TIBC, IRON, RETICCTPCT in the last 72 hours. Urine analysis:    Component Value Date/Time   COLORURINE AMBER (A) 12/07/2017 2208   APPEARANCEUR CLEAR 12/07/2017 2208   LABSPEC 1.019 12/07/2017 2208   PHURINE 5.0 12/07/2017 2208   GLUCOSEU NEGATIVE 12/07/2017 2208   HGBUR SMALL (A) 12/07/2017 2208   BILIRUBINUR NEGATIVE 12/07/2017 2208   KETONESUR NEGATIVE 12/07/2017 2208   PROTEINUR 100 (A) 12/07/2017 2208   NITRITE NEGATIVE 12/07/2017 2208   LEUKOCYTESUR NEGATIVE 12/07/2017 2208   Sepsis Labs: @LABRCNTIP (procalcitonin:4,lacticacidven:4)  )No results found for this or any previous visit (from the past 240 hour(s)).       Radiology Studies: Dg Chest Port 1 View  Result Date: 12/11/2017 CLINICAL DATA:  Decreased oxygen saturation EXAM: PORTABLE CHEST 1 VIEW COMPARISON:  December 07, 2017 FINDINGS: There are bilateral pleural effusions with bibasilar atelectatic change. There is mild scarring  in the right upper lobe. There is cardiomegaly with pulmonary vascularity within normal limits. No adenopathy. There is aortic atherosclerosis. No bone lesions. There is a calcified breast implant on the left. IMPRESSION: Persistent pleural effusions bilaterally with bibasilar atelectasis. No frank consolidation. Stable cardiac prominence. There is aortic atherosclerosis. Aortic Atherosclerosis (ICD10-I70.0). Electronically Signed   By: Lowella Grip III M.D.   On: 12/11/2017 09:16        Scheduled Meds: . carvedilol  12.5 mg Oral BID WC  . clotrimazole  1 Applicatorful Vaginal QHS  .  fluticasone furoate-vilanterol  1 puff Inhalation Daily   And  . umeclidinium bromide  1 puff Inhalation Daily  . furosemide  40 mg Oral BID  . hydrALAZINE  50 mg Oral Q8H  . levETIRAcetam  500 mg Oral BID  . pantoprazole  40 mg Oral Q1200  . potassium chloride SA  20 mEq Oral BID  . warfarin  2.5 mg Oral ONCE-1800  . Warfarin - Pharmacist Dosing Inpatient   Does not apply q1800   Continuous Infusions:   LOS: 0 days    Time spent: 25 muintes    Edwin Dada, MD [facility] 12/12/2017, 6:09 PM     Pager [phone] --- please page though AMION:  [email or web address] Password [email or web address] If 7PM-7AM, please contact night-coverage

## 2017-12-12 NOTE — Progress Notes (Signed)
ANTICOAGULATION CONSULT NOTE - Follow-Up  Pharmacy Consult for Warfarin Indication: atrial fibrillation  Patient Measurements: Height: 5\' 7"  (170.2 cm) Weight: 125 lb 7.1 oz (56.9 kg) IBW/kg (Calculated) : 61.6   Vital Signs: Temp: 97.8 F (36.6 C) (06/19 1141) Temp Source: Oral (06/19 1141) BP: 139/96 (06/19 1141) Pulse Rate: 71 (06/19 1141)  Labs: Recent Labs    12/10/17 0616 12/11/17 0622 12/12/17 0632  HGB 9.8* 10.9*  --   HCT 35.0* 39.8  --   PLT 410* 410*  --   LABPROT 22.7* 24.8* 29.1*  INR 2.02 2.26 2.78  CREATININE 1.13* 1.23* 1.32*    Estimated Creatinine Clearance: 39.2 mL/min (A) (by C-G formula based on SCr of 1.32 mg/dL (H)).  Assessment: 31 YOF on Warfarin PTA for hx Afib. Admit INR 3.67 on PTA dose of 5 mg/day - last dose unknown.  Warfarin was held on admission d/t elevated INR, but resumed 6/16 evening when INR was back in goal range.   Received 1 time dose of fluconazole for yeast infection yesterday. INR up to 2.7. CBC stable, no bleeding noted.  Goal of Therapy:  INR 2-3 Monitor platelets by anticoagulation protocol: Yes   Plan:  Warfarin 2.5 mg daily- giving lower dose today to compensate for fluconazole dose given yesterday Daily INR Follow for s/s bleeding  Thank you for allowing pharmacy to be a part of this patient's care.  Medha Pippen D. Emerald Shor, PharmD, BCPS Clinical Pharmacist [phone] Please check AMION for all [place] numbers 12/12/2017 11:46 AM

## 2017-12-12 NOTE — Treatment Plan (Signed)
  Speech Language Pathology Treatment: Cognitive-Linquistic  Patient Details Name: Destry Bezdek MRN: [id number] DOB: [birth year] Today's Date: 12/12/2017 Time: 7026-3785 SLP Time Calculation (min) (ACUTE ONLY): 16 min  Assessment / Plan / Recommendation Clinical Impression  Pt was seen for skilled cognitive treatment.  Pt was slightly less tangential in her verbal output today and was disappointed but understanding regarding inability to be accepted to [facility] for SNF.  When asked what she had been doing today, pt reported "Just bored to death and I've been peeing on myself all day."  Pt reports she is no longer using Purewick due to discomfort and possible injury (therapist did not examine to verify).  Pt indicates that incontinence is an issue of urgency and frequency.  Attempted to discuss a timed toileting schedule; however, pt reports she is unable to see the clock in her room.  SLP attempted to move clock in different locations in the hope of accessing some intact visual field given that pt was able to identify one number accurately from clock, but pt became frustrated with therapist's attempts and became verbally abusive.  Therapist apologized for any actions or words that had upset pt but pt persisted with hurtful language.  Behaviors and words were deliberate and well thought out and did not appear to correlate with cognitive dysfunction.  Therapist calmly but firmly discussed that pt's actions and words were not appropriate to the situation at hand and session was ended.    HPI HPI: Ajee Heasley is a 62 y.o. female with history of CHF, COPD, atrial fibrillation, stroke who was recently admitted to [facility] and discharged after being treated for CHF and pneumonia 3 days ago was brought to the ER after patient was found to be lying on the floor. MRI showed no acute abnormality. Chronic infarct right occipital parietal lobe and mild chronic microvascular ischemia.      SLP Plan  Continue with current plan of care       Recommendations                   Follow up Recommendations: Skilled Nursing facility SLP Visit Diagnosis: Cognitive communication deficit (Y85.027) Plan: Continue with current plan of care       GO           Functional Assessment Tool Used: cognitive linguistic treatment  Functional Limitations: Memory Memory Current Status (X4128): At least 60 percent but less than 80 percent impaired, limited or restricted Memory Goal Status (N8676): At least 40 percent but less than 60 percent impaired, limited or restricted    Emilio Math 12/12/2017, 3:43 PM

## 2017-12-12 NOTE — Progress Notes (Signed)
CSW following for discharge plan. CSW spoke with patient's sister via phone to update her on situation; patient's sister indicated that she was aware because she had spoken with the patient already and had information. Patient's sister agreed that the patient didn't have funds available to pay out of network costs, would need to go to [facility]. CSW contacted [place] admissions to request that they initiate insurance authorization. CSW left voicemail.  CSW to follow.  Laveda Abbe, [place] Clinical Social Worker [phone]

## 2017-12-12 NOTE — Clinical Social Work Note (Signed)
Clinical Social Work Assessment  Patient Details  Name: Glenda Johnson MRN: [id number] Date of Birth: 1955/08/19  Date of referral:  12/11/17               Reason for consult:  Facility Placement                Permission sought to share information with:  Facility Sport and exercise psychologist, Family Supports Permission granted to share information::  Yes, Verbal Permission Granted  Name::     Visual merchandiser::  SNF  Relationship::  Sister  Contact Information:     Housing/Transportation Living arrangements for the past 2 months:  [place] Olivar of Information:  Patient, Siblings Patient Interpreter Needed:  None Criminal Activity/Legal Involvement Pertinent to Current Situation/Hospitalization:  No - Comment as needed Significant Relationships:  Siblings Lives with:  Self Do you feel safe going back to the place where you live?  Yes Need for family participation in patient care:  Yes (Comment)  Care giving concerns:  Patient from home alone but will need short term rehab at discharge.   Social Worker assessment / plan:  CSW met with patient to discuss recommendation for SNF. CSW contacted patient's sister to discuss. CSW received permission to fax out referral and obtained list of preferred facilities. CSW to follow.  Employment status:  Retired Surveyor, [employer] Care PT Recommendations:  [place] / Referral to community resources:  [place]  Patient/Family's Response to care:  Patient agreeable to SNF placement.  Patient/Family's Understanding of and Emotional Response to Diagnosis, Current Treatment, and Prognosis:  Patient and family acknowledged that the patient needs additional rehabilitation prior to returning home. Patient is hopeful that she will be able to go move and live with her daughter in [place] after she completes rehab.  Emotional Assessment Appearance:  Appears stated age Attitude/Demeanor/Rapport:   Engaged Affect (typically observed):  Appropriate, Apprehensive Orientation:  Oriented to Self, Oriented to Place, Oriented to  Time, Oriented to Situation Alcohol / Substance use:  Not Applicable Psych involvement (Current and /or in the community):  No (Comment)  Discharge Needs  Concerns to be addressed:  Care Coordination Readmission within the last 30 days:  No Current discharge risk:  Dependent with Mobility, Lives alone, Cognitively Impaired Barriers to Discharge:  Continued Medical Work up, [place], [place] 12/12/2017, 9:26 AM

## 2017-12-12 NOTE — Progress Notes (Signed)
Physical Therapy Treatment Patient Details Name: Glenda Johnson MRN: [id number] DOB: [birth year] Today's Date: 12/12/2017    History of Present Illness 62 y.o. female admitted on 12/07/17 for confusion.  Pt found to be lying in the floor at home by her sister.  CT of head with no acute events, MRI is negative for acute events.  Pt found to be in A-fib with RVR.  MRI whoed remote Rt occipital inarct, but no acute changes.  Dx:  Likely seizure due to abnormal EEG.   Pt with other significant PMH of recent stroke (March), HTN, Hepatitis C, COPD, CHF, A-fib, and back surgery.      PT Comments    Patient is making progress toward PT goals. Continue to progress as tolerated with anticipated d/c to SNF for further skilled PT services.     Follow Up Recommendations  SNF     Equipment Recommendations  None recommended by PT    Recommendations for Other Services       Precautions / Restrictions Precautions Precautions: Fall Restrictions Weight Bearing Restrictions: No    Mobility  Bed Mobility Overal bed mobility: Modified Independent Bed Mobility: Supine to Sit;Sit to Supine           General bed mobility comments: increased time and effort  Transfers Overall transfer level: Needs assistance Equipment used: (rollator) Transfers: Sit to/from Stand Sit to Stand: Min guard         General transfer comment: min guard for safety; cues for safe use of brakes on rollator and for hand placement  Ambulation/Gait Ambulation/Gait assistance: Min guard   Assistive device: (rollator) Gait Pattern/deviations: Decreased stride length;Step-through pattern;Trunk flexed Gait velocity: slow   General Gait Details: two seated rest breaks due to pt c/o difficulty catching breath; SpO2 96% on RA    Stairs             Wheelchair Mobility    Modified Rankin (Stroke Patients Only) Modified Rankin (Stroke Patients Only) Pre-Morbid Rankin Score: Moderate disability Modified Rankin:  Moderately severe disability     Balance Overall balance assessment: Needs assistance Sitting-balance support: Feet supported Sitting balance-Leahy Scale: Fair     Standing balance support: No upper extremity supported Standing balance-Leahy Scale: Fair                              Cognition Arousal/Alertness: Awake/alert Behavior During Therapy: Anxious;Flat affect Overall Cognitive Status: No family/caregiver present to determine baseline cognitive functioning Area of Impairment: Attention;Following commands;Safety/judgement;Awareness;Problem solving                   Current Attention Level: Sustained   Following Commands: Follows one step commands consistently;Follows one step commands with increased time     Problem Solving: Requires verbal cues        Exercises      General Comments        Pertinent Vitals/Pain Pain Assessment: No/denies pain Faces Pain Scale: No hurt    Home Living                      Prior Function            PT Goals (current goals can now be found in the care plan section) Acute Rehab PT Goals PT Goal Formulation: Patient unable to participate in goal setting Time For Goal Achievement: 12/22/17 Potential to Achieve Goals: Fair Progress towards PT goals: Progressing toward goals  Frequency    Min 3X/week      PT Plan Current plan remains appropriate    Co-evaluation              AM-PAC PT "6 Clicks" Daily Activity  Outcome Measure  Difficulty turning over in bed (including adjusting bedclothes, sheets and blankets)?: A Little Difficulty moving from lying on back to sitting on the side of the bed? : A Lot Difficulty sitting down on and standing up from a chair with arms (e.g., wheelchair, bedside commode, etc,.)?: A Lot Help needed moving to and from a bed to chair (including a wheelchair)?: A Little Help needed walking in hospital room?: A Little Help needed climbing 3-5 steps with  a railing? : A Little 6 Click Score: 16    End of Session Equipment Utilized During Treatment: Gait belt Activity Tolerance: Patient tolerated treatment well Patient left: with call bell/phone within reach;in bed;with bed alarm set Nurse Communication: Mobility status PT Visit Diagnosis: Difficulty in walking, not elsewhere classified (R26.2);Other symptoms and signs involving the nervous system (R29.898);History of falling (Z91.81)     Time: 1607-3710 PT Time Calculation (min) (ACUTE ONLY): 37 min  Charges:  $Gait Training: 8-22 mins $Therapeutic Activity: 8-22 mins                    G Codes:       Earney Navy, PTA Pager: [phone]     Darliss Cheney 12/12/2017, 4:56 PM

## 2017-12-12 NOTE — Progress Notes (Signed)
CSW discussed referral with Admissions at [facility] to receive bed offer, updated patient. CSW then got a call back from Admissions at [facility] that they are not contracted with the patient's BCBS like they thought they were; unable to offer a bed because the patient would have out of network costs. CSW checked in with patient's second choice, [facility], received bed offer. CSW updated patient; patient upset and wanted to know how much the out of network would cost, but she wouldn't pay for it anyway. Patient requested that CSW contact patient's sister to discuss, as well.  CSW to follow.  Laveda Abbe, [place] Clinical Social Worker [phone]

## 2017-12-13 DIAGNOSIS — E876 Hypokalemia: Secondary | ICD-10-CM

## 2017-12-13 DIAGNOSIS — Z91013 Allergy to seafood: Secondary | ICD-10-CM | POA: Diagnosis not present

## 2017-12-13 DIAGNOSIS — Z86711 Personal history of pulmonary embolism: Secondary | ICD-10-CM | POA: Diagnosis not present

## 2017-12-13 DIAGNOSIS — J449 Chronic obstructive pulmonary disease, unspecified: Secondary | ICD-10-CM | POA: Diagnosis present

## 2017-12-13 DIAGNOSIS — Z885 Allergy status to narcotic agent status: Secondary | ICD-10-CM | POA: Diagnosis not present

## 2017-12-13 DIAGNOSIS — I700 Atherosclerosis of aorta: Secondary | ICD-10-CM | POA: Diagnosis present

## 2017-12-13 DIAGNOSIS — F0390 Unspecified dementia without behavioral disturbance: Secondary | ICD-10-CM | POA: Diagnosis present

## 2017-12-13 DIAGNOSIS — Z7901 Long term (current) use of anticoagulants: Secondary | ICD-10-CM | POA: Diagnosis not present

## 2017-12-13 DIAGNOSIS — Z8673 Personal history of transient ischemic attack (TIA), and cerebral infarction without residual deficits: Secondary | ICD-10-CM | POA: Diagnosis not present

## 2017-12-13 DIAGNOSIS — I480 Paroxysmal atrial fibrillation: Secondary | ICD-10-CM | POA: Diagnosis present

## 2017-12-13 DIAGNOSIS — T502X5A Adverse effect of carbonic-anhydrase inhibitors, benzothiadiazides and other diuretics, initial encounter: Secondary | ICD-10-CM | POA: Diagnosis present

## 2017-12-13 DIAGNOSIS — B1920 Unspecified viral hepatitis C without hepatic coma: Secondary | ICD-10-CM | POA: Diagnosis present

## 2017-12-13 DIAGNOSIS — I5033 Acute on chronic diastolic (congestive) heart failure: Secondary | ICD-10-CM

## 2017-12-13 DIAGNOSIS — Z79899 Other long term (current) drug therapy: Secondary | ICD-10-CM | POA: Diagnosis not present

## 2017-12-13 DIAGNOSIS — N760 Acute vaginitis: Secondary | ICD-10-CM | POA: Diagnosis not present

## 2017-12-13 DIAGNOSIS — R569 Unspecified convulsions: Secondary | ICD-10-CM | POA: Diagnosis present

## 2017-12-13 DIAGNOSIS — D509 Iron deficiency anemia, unspecified: Secondary | ICD-10-CM | POA: Diagnosis present

## 2017-12-13 DIAGNOSIS — N183 Chronic kidney disease, stage 3 (moderate): Secondary | ICD-10-CM | POA: Diagnosis present

## 2017-12-13 DIAGNOSIS — I4891 Unspecified atrial fibrillation: Secondary | ICD-10-CM | POA: Diagnosis not present

## 2017-12-13 DIAGNOSIS — Z7952 Long term (current) use of systemic steroids: Secondary | ICD-10-CM | POA: Diagnosis not present

## 2017-12-13 DIAGNOSIS — K219 Gastro-esophageal reflux disease without esophagitis: Secondary | ICD-10-CM | POA: Diagnosis present

## 2017-12-13 DIAGNOSIS — I130 Hypertensive heart and chronic kidney disease with heart failure and stage 1 through stage 4 chronic kidney disease, or unspecified chronic kidney disease: Secondary | ICD-10-CM | POA: Diagnosis present

## 2017-12-13 DIAGNOSIS — R0902 Hypoxemia: Secondary | ICD-10-CM | POA: Diagnosis not present

## 2017-12-13 DIAGNOSIS — Z888 Allergy status to other drugs, medicaments and biological substances status: Secondary | ICD-10-CM | POA: Diagnosis not present

## 2017-12-13 DIAGNOSIS — G9340 Encephalopathy, unspecified: Secondary | ICD-10-CM | POA: Diagnosis present

## 2017-12-13 DIAGNOSIS — R55 Syncope and collapse: Secondary | ICD-10-CM | POA: Diagnosis present

## 2017-12-13 LAB — CBC
HCT: 36.2 % (ref 36.0–46.0)
HEMOGLOBIN: 10.1 g/dL — AB (ref 12.0–15.0)
MCH: 21.5 pg — ABNORMAL LOW (ref 26.0–34.0)
MCHC: 27.9 g/dL — ABNORMAL LOW (ref 30.0–36.0)
MCV: 77 fL — ABNORMAL LOW (ref 78.0–100.0)
Platelets: 305 10*3/uL (ref 150–400)
RBC: 4.7 MIL/uL (ref 3.87–5.11)
RDW: 21.7 % — AB (ref 11.5–15.5)
WBC: 10.3 10*3/uL (ref 4.0–10.5)

## 2017-12-13 LAB — PROTIME-INR
INR: 3.68
PROTHROMBIN TIME: 36.3 s — AB (ref 11.4–15.2)

## 2017-12-13 MED ORDER — HYDRALAZINE HCL 50 MG PO TABS: 50.0000 mg | ORAL_TABLET | Freq: Three times a day (TID) | ORAL | 3 refills | Status: AC

## 2017-12-13 MED ORDER — LEVETIRACETAM 500 MG PO TABS: 500.0000 mg | ORAL_TABLET | Freq: Two times a day (BID) | ORAL | 3 refills | Status: AC

## 2017-12-13 NOTE — Plan of Care (Signed)

## 2017-12-13 NOTE — Progress Notes (Signed)
Occupational Therapy Treatment Patient Details Name: Glenda Johnson MRN: [id number] DOB: 06/30/1955 Today's Date: 12/13/2017    History of present illness 62 y.o. female admitted on 12/07/17 for confusion.  Pt found to be lying in the floor at home by her sister.  CT of head with no acute events, MRI is negative for acute events.  Pt found to be in A-fib with RVR.  MRI whoed remote Rt occipital inarct, but no acute changes.  Dx:  Likely seizure due to abnormal EEG.   Pt with other significant PMH of recent stroke (March), HTN, Hepatitis C, COPD, CHF, A-fib, and back surgery.     OT comments  Pt continuing to make progress towards OT goals. Pt transferring without use of AD this session with steady assistance. Pt utilizing BSC for toileting and able to perform hygiene while in standing without use of external support but close supervision for balance. Pt applying barrier cream herself as well secondary to tenderness on buttocks. Pt returning to bed for UB dressing and PT transitioning into the room.    Follow Up Recommendations  SNF;Supervision/Assistance - 24 hour    Equipment Recommendations  Other (comment)(defer to next venue of care)    Recommendations for Other Services      Precautions / Restrictions Precautions Precautions: Fall Restrictions Weight Bearing Restrictions: No       Mobility Bed Mobility Overal bed mobility: Needs Assistance Bed Mobility: Supine to Sit     Supine to sit: Supervision        Transfers Overall transfer level: Needs assistance Equipment used: None Transfers: Sit to/from Stand Sit to Stand: Supervision Stand pivot transfers: Min guard       General transfer comment: min guard for safety     Balance Overall balance assessment: Needs assistance Sitting-balance support: Feet supported Sitting balance-Leahy Scale: Fair Sitting balance - Comments: supervision seated EOB          ADL either performed or assessed with clinical judgement    ADL Overall ADL's : Needs assistance/impaired     Grooming: Wash/dry hands   Upper Body Bathing: Set up;Standing;Supervision/ safety       Upper Body Dressing : Supervision/safety;Set up;Sitting       Toilet Transfer: Min guard;Stand-pivot;BSC   Toileting- Clothing Manipulation and Hygiene: Min guard         General ADL Comments: Pt completed bed mobility with supervision and transferred to [facility] with min guard. Pt able to have BM and urinate and standing with close supervision for hygiene and clothing management. Pt returning to bed and donning pull over shirt with set up A while seated.      Vision Baseline Vision/History: Wears glasses Wears Glasses: At all times            Cognition Arousal/Alertness: Awake/alert Behavior During Therapy: Anxious;Flat affect Overall Cognitive Status: No family/caregiver present to determine baseline cognitive functioning Area of Impairment: Safety/judgement;Awareness;Problem solving                           Pertinent Vitals/ Pain       Pain Assessment: No/denies pain         Frequency  Min 2X/week        Progress Toward Goals  OT Goals(current goals can now be found in the care plan section)  Progress towards OT goals: Progressing toward goals  Acute Rehab OT Goals Patient Stated Goal: to feel better OT Goal Formulation: With patient Time For  Goal Achievement: 12/27/17 Potential to Achieve Goals: Good  Plan Discharge plan remains appropriate       AM-PAC PT "6 Clicks" Daily Activity     Outcome Measure   Help from another person eating meals?: None Help from another person taking care of personal grooming?: A Little Help from another person toileting, which includes using toliet, bedpan, or urinal?: A Little Help from another person bathing (including washing, rinsing, drying)?: A Little Help from another person to put on and taking off regular upper body clothing?: A Little Help from another  person to put on and taking off regular lower body clothing?: A Little 6 Click Score: 19    End of Session Equipment Utilized During Treatment: Other (comment)(BSC)  OT Visit Diagnosis: Unsteadiness on feet (R26.81)   Activity Tolerance Patient tolerated treatment well   Patient Left Other (comment)(seated on EOB and transitioning to PT session)   Nurse Communication          Time: 3794-3276 OT Time Calculation (min): 23 min  Charges: OT Treatments $Self Care/Home Management : 23-37 mins    Gypsy Decant 12/13/2017, 10:36 AM

## 2017-12-13 NOTE — Progress Notes (Signed)
ANTICOAGULATION CONSULT NOTE - Follow-Up  Pharmacy Consult for Warfarin Indication: atrial fibrillation  Patient Measurements: Height: 5\' 7"  (170.2 cm) Weight: 125 lb 7.1 oz (56.9 kg) IBW/kg (Calculated) : 61.6   Vital Signs: Temp: 97.6 F (36.4 C) (06/20 0726) Temp Source: Oral (06/20 0726) BP: 123/57 (06/20 0726) Pulse Rate: 62 (06/20 0726)  Labs: Recent Labs    12/11/17 0622 12/12/17 7530 12/13/17 0458  HGB 10.9*  --  10.1*  HCT 39.8  --  36.2  PLT 410*  --  305  LABPROT 24.8* 29.1* 36.3*  INR 2.26 2.78 3.68  CREATININE 1.23* 1.32*  --     Estimated Creatinine Clearance: 39.2 mL/min (A) (by C-G formula based on SCr of 1.32 mg/dL (H)).  Assessment: 37 YOF on Warfarin PTA for hx Afib. Admit INR 3.67 on PTA dose of 5 mg/day - last dose unknown.  Warfarin was held on admission d/t elevated INR, but resumed 6/16 evening when INR was back in goal range.   Received 1 time dose of fluconazole for yeast infection on 6/18, gave lower dose of warfarin yesterday but INR still elevated at 3.68 this morning.  CBC stable, no bleeding noted.  Goal of Therapy:  INR 2-3 Monitor platelets by anticoagulation protocol: Yes   Plan:  Hold warfarin tonight  Daily INR Follow for s/s bleeding  Thank you for allowing pharmacy to be a part of this patient's care.  Pavielle Biggar D. Seena Face, PharmD, BCPS Clinical Pharmacist [phone] Please check AMION for all [place] numbers 12/13/2017 11:09 AM

## 2017-12-13 NOTE — Clinical Social Work Placement (Signed)
Nurse to call report to [phone], Room 307; Ask for [place]  NOTE  Date:  12/13/2017  Patient Details  Name: Glenda Johnson MRN: [id number] Date of Birth: 04/21/1956  Clinical Social Work is seeking post-discharge placement for this patient at the [place] level of care (*CSW will initial, date and re-position this form in  chart as items are completed):  Yes   Patient/family provided with [facility] Work Department's list of facilities offering this level of care within the geographic area requested by the patient (or if unable, by the patient's family).  Yes   Patient/family informed of their freedom to choose among providers that offer the needed level of care, that participate in Medicare, Medicaid or managed care program needed by the patient, have an available bed and are willing to accept the patient.  Yes   Patient/family informed of [facility]'s ownership interest in [facility] and [facility], as well as of the fact that they are under no obligation to receive care at these facilities.  PASRR submitted to EDS on 12/11/17     PASRR number received on 12/11/17     Existing PASRR number confirmed on       FL2 transmitted to all facilities in geographic area requested by pt/family on 12/11/17     FL2 transmitted to all facilities within larger geographic area on       Patient informed that his/her managed care company has contracts with or will negotiate with certain facilities, including the following:        Yes   Patient/family informed of bed offers received.  Patient chooses bed at [facility]     Physician recommends and patient chooses bed at      Patient to be transferred to [facility] on 12/13/17.  Patient to be transferred to facility by [facility]     Patient family notified on 12/13/17 of transfer.  Name of family member notified:  [name]     PHYSICIAN        Additional Comment:    _______________________________________________ Geralynn Ochs, LCSW 12/13/2017, 2:27 PM

## 2017-12-13 NOTE — Progress Notes (Signed)
Received call from telemetry about 8 beats of vtach. MD notified. Pt VSS, asymptomatic. Will continue to monitor

## 2017-12-13 NOTE — Discharge Summary (Signed)
Physician Discharge Summary  Glenda Johnson WLN:[id number] DOB: 1955-11-11 DOA: 12/07/2017  PCP: [name], MD  Admit date: 12/07/2017 Discharge date: 12/13/2017  Admitted From: Home  Disposition:  SNF   Recommendations for Outpatient Follow-up:  1. Follow up with Neurology in 2 weeks as previously scheduled 2. For Dr. Leonie Man, please arrange for follow up of her new Keppra, as well as neurocognitive testing for her suspected dementia  3. Please obtain INR in 4 days 4. Please obtain BMP in 1 week   Home Health: N/A  Equipment/Devices: TBD at SNF  Discharge Condition: Good  CODE STATUS: FULL Diet recommendation: Cardiac  Brief/Interim Summary: Glenda Johnson is a 62 y.o. F with dCHF, COPD not on home O2, recent CVA, A. Fib on warfarin, and recent hospitalization for CHF and pneumonia presented after being found on the floor covered in feces by sister.  Patient noted on admission to be in A. fib with RVR subsequently started on a Cardizem drip, admitted for syncope work up, CHF flare, Afib with RVR.        Seizure Initial concern was for syncope.  However, reliable history could not be obtained, orthostatics negative, telemetry unremarkable.  Echo unremarkable.  No further episodes that could be construed as syncope.  Empiric antibiotics were stopped without change in clinical status.   Subsequent CT head and MRI brain were unremarkable except for encephalomalacia from recent stroke.  Follow up EEG noted "paroxysmal, bifrontotemporal buildup of fast activity" that was suspected to represent a propensity for seizures.  Neurology were consulted, suspected strongly she had had a seizure and started Keppra.  She had no more events. -Continue Keppra, follow up with Neurology in 2 weeks is already in place  Atrial fibrillation, paroxysmal, with RVR Started on diltiazem drip and IV Lasix.  Heart rate controlled.  Warfarin continued.  Resumed home carvedilol.  -Obtain INR in 4  days.  Acute on chronic diastolic CHF BNP >7408 at admission, hypoxic.  IV diuresis and breathing and swelling resolved.  Resumed home K and furosemide at discharge.  -Check BMP in 1 week   Dementia Patient's memory has been markedly impaired since her stroke in March.  Per pharmacy records, she has missed medication refills.  Per sister, she cannot prepare meals nor care for her cats anymore nor clean her house.  On my exam, she is oriented but has short term memory deficits, and is disorganized in thought process, unable to follow social work planning for discharge. -Recommend outpatient neurocognitive testing    Hypertension BB continued.  Hydralazine added.  COPD No active disease.  Vaginitis Treated with clotrimazole and fluconazole x1.  Symptoms resolved.     Chronic kidney disease stage III Stable at baseline.    Discharge Diagnoses:  Principal Problem:   Seizure ([place]) Active Problems:   COPD (chronic obstructive pulmonary disease) (HCC)   Cerebrovascular accident (CVA) due to embolism of cerebral artery (HCC)   CKD (chronic kidney disease) stage 3, GFR 30-59 ml/min (HCC)   Hypokalemia   Atrial fibrillation with RVR (HCC)   Acute on chronic diastolic congestive heart failure (HCC)   Syncope    Discharge Instructions  Discharge Instructions    Diet - low sodium heart healthy   Complete by:  As directed    Discharge instructions   Complete by:  As directed    From Dr. Loleta Books: You were admitted for a seizure. Seizures are a somewhat frequent consequence of strokes.  They are usually well treated with anti-seizure medicines  like Keppra, which we have started.     -Take levetiracetam/Keppra 500 mg twice daily     -Follow up with Dr. Leonie Man as previously planned.  We will send him a message about this episode.  You were also found to have an episode of heart failure, which has now been resolved with medicines.     -Continue your home furosemide twice daily as  before.     -Weigh yourself daily.  If your weight increases by more than 5 lbs from your discharge weight (125 lbs) then you must call your doctor immediately.     -Continue your warfarin.     -Have your rehab facility recheck an INR in 4 days and adjust warfarin as needed.  We have increased your hydralazine.       -Mind the new dose 50 mg three times daily.       -Continue your old metoprolol.   You also have some signs of memory loss since your stroke.       -When you follow up with Dr. Leonie Man, ask about work up for dementia   Increase activity slowly   Complete by:  As directed      Allergies as of 12/13/2017      Reactions   Codeine Anaphylaxis   Tramadol Anaphylaxis   No hydrocodone or anything connected No hydrocodone or anything connected   Clarithromycin Nausea And Vomiting   Citalopram Other (See Comments)   Caused more depression    Fish Allergy Itching   ears   Other    Patient says that all narcotics give her anaphylaxis.    Wellbutrin [bupropion] Other (See Comments)   "could not speak a full sentence for a whole year"      Medication List    STOP taking these medications   LORazepam 0.5 MG tablet Commonly known as:  ATIVAN   predniSONE 10 MG tablet Commonly known as:  DELTASONE     TAKE these medications   albuterol 108 (90 Base) MCG/ACT inhaler Commonly known as:  PROVENTIL HFA;VENTOLIN HFA Inhale 2 puffs into the lungs every 4 (four) hours as needed.   albuterol (2.5 MG/3ML) 0.083% nebulizer solution Commonly known as:  PROVENTIL Take 3 mLs (2.5 mg total) by nebulization every 6 (six) hours as needed for wheezing.   carvedilol 12.5 MG tablet Commonly known as:  COREG Take 1 tablet (12.5 mg total) by mouth 2 (two) times daily with a meal.   furosemide 40 MG tablet Commonly known as:  LASIX Take 1 tablet (40 mg total) by mouth 2 (two) times daily.   hydrALAZINE 50 MG tablet Commonly known as:  APRESOLINE Take 1 tablet (50 mg total) by mouth  every 8 (eight) hours. What changed:    medication strength  how much to take  when to take this   levETIRAcetam 500 MG tablet Commonly known as:  KEPPRA Take 1 tablet (500 mg total) by mouth 2 (two) times daily.   nitroGLYCERIN 0.4 MG SL tablet Commonly known as:  NITROSTAT Place 0.4 mg under the tongue every 5 (five) minutes as needed for chest pain.   pantoprazole 40 MG tablet Commonly known as:  PROTONIX Take 1 tablet (40 mg total) by mouth daily at 12 noon.   potassium chloride SA 20 MEQ tablet Commonly known as:  K-DUR,KLOR-CON Take 2 tablets (40 mEq total) by mouth daily. What changed:    how much to take  when to take this   TRELEGY ELLIPTA 100-62.5-25 MCG/INH  Aepb Generic drug:  Fluticasone-Umeclidin-Vilant Inhale 1 puff into the lungs daily.   warfarin 5 MG tablet Commonly known as:  COUMADIN Take 1 tablet (5 mg total) by mouth daily.            Durable Medical Equipment  (From admission, onward)        Start     Ordered   12/10/17 1712  For home use only DME Nebulizer machine  Once    Question:  Patient needs a nebulizer to treat with the following condition  Answer:  COPD (chronic obstructive pulmonary disease) ([place])   12/10/17 1712   12/10/17 1102  For home use only DME Tub bench  Once     12/10/17 1101   12/10/17 1101  For home use only DME 3 n 1  Once     12/10/17 1101     Contact information for after-discharge care    Destination    [facility] .   Service:  Skilled Nursing Contact information: [place] [phone]             Allergies  Allergen Reactions  . Codeine Anaphylaxis  . Tramadol Anaphylaxis    No hydrocodone or anything connected No hydrocodone or anything connected   . Clarithromycin Nausea And Vomiting  . Citalopram Other (See Comments)    Caused more depression   . Fish Allergy Itching    ears  . Other     Patient says that all narcotics give her  anaphylaxis.   . Wellbutrin [Bupropion] Other (See Comments)    "could not speak a full sentence for a whole year"    Consultations:  Neurology   Procedures/Studies: Dg Chest 2 View  Result Date: 12/07/2017 CLINICAL DATA:  Syncope.  History of chronic heart failure and COPD. EXAM: CHEST - 2 VIEW COMPARISON:  Single-view of the chest 12/01/2017 11/01/2017. FINDINGS: Small to moderate bilateral pleural effusions and basilar atelectasis appear somewhat improved compared to the most recent examination. Cardiac silhouette is largely obscured. No evidence of pulmonary edema. Aortic atherosclerosis is noted. No acute bony abnormality. IMPRESSION: Small to moderate bilateral pleural effusions and basilar atelectasis appear improved since the most recent examination. Atherosclerosis. Electronically Signed   By: Inge Rise M.D.   On: 12/07/2017 19:29   Ct Head Wo Contrast  Result Date: 12/07/2017 CLINICAL DATA:  Patient found down at 4:30 p.m. today. EXAM: CT HEAD WITHOUT CONTRAST TECHNIQUE: Contiguous axial images were obtained from the base of the skull through the vertex without intravenous contrast. COMPARISON:  Head CT 09/20/2017.  Brain MRI 09/19/2017. FINDINGS: Brain: No evidence of acute infarction, hemorrhage, hydrocephalus, extra-axial collection or mass lesion/mass effect. Atrophy, chronic microvascular ischemic change and remote right occipital infarct are identified. Additional smaller infarcts seen on MRI are difficult to appreciate on this exam. Vascular: No hyperdense vessel or unexpected calcification. Skull: Intact. Sinuses/Orbits: Negative. Other: None. IMPRESSION: No acute abnormality. Atrophy, chronic microvascular ischemic change and remote infarcts. Electronically Signed   By: Inge Rise M.D.   On: 12/07/2017 19:53   Mr Brain Wo Contrast (neuro Protocol)  Result Date: 12/08/2017 CLINICAL DATA:  Altered level of consciousness. EXAM: MRI HEAD WITHOUT CONTRAST TECHNIQUE:  Multiplanar, multiecho pulse sequences of the brain and surrounding structures were obtained without intravenous contrast. COMPARISON:  CT head 12/07/2017 FINDINGS: Brain: Negative for acute infarct. Chronic infarct right occipital parietal lobe with chronic blood products. Mild chronic microvascular ischemia in the white matter. Small chronic  infarct right pons and left cerebellum Ventricle size normal. Mild cerebral atrophy. Negative for hemorrhage or mass. Vascular: Normal arterial flow voids Skull and upper cervical spine: Negative Sinuses/Orbits: Negative Other: None IMPRESSION: No acute abnormality. Chronic infarct right occipital parietal lobe and mild chronic microvascular ischemia. Electronically Signed   By: Franchot Gallo M.D.   On: 12/08/2017 09:35   Dg Chest Port 1 View  Result Date: 12/11/2017 CLINICAL DATA:  Decreased oxygen saturation EXAM: PORTABLE CHEST 1 VIEW COMPARISON:  December 07, 2017 FINDINGS: There are bilateral pleural effusions with bibasilar atelectatic change. There is mild scarring in the right upper lobe. There is cardiomegaly with pulmonary vascularity within normal limits. No adenopathy. There is aortic atherosclerosis. No bone lesions. There is a calcified breast implant on the left. IMPRESSION: Persistent pleural effusions bilaterally with bibasilar atelectasis. No frank consolidation. Stable cardiac prominence. There is aortic atherosclerosis. Aortic Atherosclerosis (ICD10-I70.0). Electronically Signed   By: Lowella Grip III M.D.   On: 12/11/2017 09:16   Dg Chest Port 1 View  Result Date: 12/01/2017 CLINICAL DATA:  Sepsis EXAM: PORTABLE CHEST 1 VIEW COMPARISON:  December 01, 2017 FINDINGS: There is cardiomegaly with pulmonary venous hypertension. There is mild interstitial edema. There are pleural effusions bilaterally. There is bibasilar atelectasis. There is no frank airspace consolidation. There is aortic atherosclerosis. No evident adenopathy. No bone lesions. IMPRESSION:  Pulmonary vascular congestion with pleural effusions and interstitial edema. The appearance is felt to represent a degree of congestive heart failure. There is bibasilar atelectasis. There is no frank consolidation. Electronically Signed   By: Lowella Grip III M.D.   On: 12/01/2017 07:15   Dg Chest Port 1 View  Result Date: 12/01/2017 CLINICAL DATA:  Code sepsis with nonproductive cough and shortness-of-breath as well as anxiety today. EXAM: PORTABLE CHEST 1 VIEW COMPARISON:  11/01/2017 FINDINGS: Lungs are hypoinflated with hazy prominence of the perihilar markings with mild bibasilar opacification. Findings likely due to mild vascular congestion with small bilateral pleural effusions. Vascular congestion unchanged as effusion slightly improved. Cardiomediastinal silhouette and remainder of the exam is unchanged. IMPRESSION: Findings suggesting mild vascular congestion without significant change and small bilateral pleural effusions slightly improved. Electronically Signed   By: Marin Olp M.D.   On: 12/01/2017 00:50   Dg Foot Complete Right  Result Date: 12/07/2017 CLINICAL DATA:  Right foot pain.  No known injury. EXAM: RIGHT FOOT COMPLETE - 3+ VIEW COMPARISON:  None. FINDINGS: No acute bony or joint abnormality is seen. Mild first MTP osteoarthritis is noted. Large calcaneal spur at the Achilles tendon insertion is noted. The patient also has a small plantar calcaneal spur. IMPRESSION: No acute abnormality. Mild first MTP osteoarthritis. Calcaneal spurring. Electronically Signed   By: Inge Rise M.D.   On: 12/07/2017 19:30   Echo 6/8 Study Conclusions  - Left ventricle: The cavity size was normal. There was mild   concentric hypertrophy. Systolic function was at the lower limits   of normal. The estimated ejection fraction was in the range of   50% to 55%. Mild diffuse hypokinesis with no identifiable   regional variations. Features are consistent with a pseudonormal   left  ventricular filling pattern, with concomitant abnormal   relaxation and increased filling pressure (grade 2 diastolic   dysfunction). Doppler parameters are consistent with elevated   mean left atrial filling pressure. Acoustic contrast   opacification revealed no evidence ofthrombus. - Left atrium: The atrium was mildly dilated. - Right ventricle: The cavity size was mildly dilated.  Systolic   function was moderately reduced. - Right atrium: The atrium was mildly dilated. - Atrial septum: No defect or patent foramen ovale was identified. - Pulmonary arteries: Systolic pressure was moderately increased.   PA peak pressure: 56 mm Hg (S).  Impressions:  - The large intraventricular mass seen on the previous echo is no   longer present, consistent with resolution of thrombus   EEG 6/15 Description: Pt is noted to be very confused (appears asleep). A 6 Hz theta rhythm is seen posteriorly with superimposed, generalized beta (13 Hz).  There is no well defined drowsiness or stage 2 sleep identified.  At approximately 10 minutes into the recording there is a rapid increase in the amplitude of 20Hz  fast activity seen over the bifrontotemporal head regions, lasting about 46min 30 sec, without any clinical correlate on video (pt appears to remain asleep).  Neither HV or photic were performed.   EKG was monitored and noted to be irregular (? AFib) at 108 bpm with occasional PVC's.  No definite epileptiform changes were noted.  Impression: This is an abnormal EEG due to background slowing seen throughout the tracing.  This is a non-specific finding that can be seen with toxic, metabolic, diffuse, or multifocal structural processes.  Additionally, there was at least one episode of paroxysmal, bifrontotemporal buildup of fast activity, without clinical correlate.  Although not frankly epileptiform, the abrupt background change might signify an underlying propensity towards seizures.  Given the noted  clinical history, a long term video EEG might be useful for further evaluation, if clinically indicated.  A single EEG without epileptiform changes does not exclude the diagnosis of epilepsy. Clinical correlation advised.   Carvel Getting, M.D. Neurology Cell [phone]      Subjective: Feels well.  No dyspnea, orthopnea, swelling.  No confusion, weakness.  No chest pain.  No dyspnea.  Discharge Exam: Vitals:   12/13/17 0605 12/13/17 0726  BP: (!) 154/85 (!) 123/57  Pulse: 67 62  Resp:  18  Temp:  97.6 F (36.4 C)  SpO2: 97% 94%   Vitals:   12/13/17 0011 12/13/17 0410 12/13/17 0605 12/13/17 0726  BP: (!) 150/72 (!) 158/68 (!) 154/85 (!) 123/57  Pulse: (!) 54 68 67 62  Resp: 16 16  18   Temp: (!) 97.4 F (36.3 C) (!) 97.2 F (36.2 C)  97.6 F (36.4 C)  TempSrc: Oral Oral  Oral  SpO2: 97% 97% 97% 94%  Weight:      Height:        General: Pt is alert, awake, not in acute distress, sitting on edge of bed, in street clothes Cardiovascular: RRR, S1/S2 +, no rubs, no gallops Respiratory: CTA bilaterally, no wheezing, no rhonchi Abdominal: Soft, NT, ND, bowel sounds + Extremities: no edema, no cyanosis Neuro: Interactive, oriented to hperson, place and time.  Judgment and insight seem impaired, short term memory seems disorganized.    The results of significant diagnostics from this hospitalization (including imaging, microbiology, ancillary and laboratory) are listed below for reference.     Microbiology: No results found for this or any previous visit (from the past 240 hour(s)).   Labs: BNP (last 3 results) Recent Labs    10/09/17 1232 12/01/17 0005 12/07/17 2030  BNP >4,500.0* >4,500.0* >8,841.6*   Basic Metabolic Panel: Recent Labs  Lab 12/08/17 0437 12/09/17 1011 12/09/17 1025 12/10/17 0616 12/11/17 0622 12/12/17 0632  NA 141 137  --  136 140 137  K 3.3* 3.1*  --  3.5  3.8 4.2  CL 103 98*  --  99* 100* 102  CO2 26 26  --  29 29 28    GLUCOSE 99 255*  --  108* 110* 122*  BUN 30* 24*  --  17 16 18   CREATININE 1.37* 1.27*  --  1.13* 1.23* 1.32*  CALCIUM 8.5* 8.5*  --  8.4* 8.9 8.7*  MG  --  1.7  --  2.2  --   --   PHOS  --   --  2.6  --   --   --    Liver Function Tests: Recent Labs  Lab 12/07/17 2030 12/08/17 0437  AST 26 18  ALT 17 15  ALKPHOS 55 53  BILITOT 2.6* 2.3*  PROT 6.4* 6.0*  ALBUMIN 3.3* 3.1*   No results for input(s): LIPASE, AMYLASE in the last 168 hours. Recent Labs  Lab 12/08/17 0721  AMMONIA 20   CBC: Recent Labs  Lab 12/07/17 2030  12/08/17 0437 12/08/17 0721 12/09/17 1011 12/10/17 0616 12/11/17 0622 12/13/17 0458  WBC 12.9*  --  10.5  --  17.0* 14.3* 15.8* 10.3  NEUTROABS 10.2*  --   --   --  14.2*  --   --   --   HGB 11.0*   < > 10.4*  --  10.4* 9.8* 10.9* 10.1*  HCT 41.6   < > 37.3 36.1 37.6 35.0* 39.8 36.2  MCV 77.6*  --  75.2*  --  75.5* 75.4* 77.4* 77.0*  PLT 443*  --  394  --  442* 410* 410* 305   < > = values in this interval not displayed.   Cardiac Enzymes: Recent Labs  Lab 12/07/17 2030 12/08/17 0437 12/08/17 0721 12/08/17 1531  CKTOTAL 47  --   --   --   TROPONINI 0.07* 0.07* 0.07* 0.06*   BNP: Invalid input(s): POCBNP CBG: Recent Labs  Lab 12/07/17 1938  GLUCAP 107*   D-Dimer No results for input(s): DDIMER in the last 72 hours. Hgb A1c No results for input(s): HGBA1C in the last 72 hours. Lipid Profile No results for input(s): CHOL, HDL, LDLCALC, TRIG, CHOLHDL, LDLDIRECT in the last 72 hours. Thyroid function studies No results for input(s): TSH, T4TOTAL, T3FREE, THYROIDAB in the last 72 hours.  Invalid input(s): FREET3 Anemia work up No results for input(s): VITAMINB12, FOLATE, FERRITIN, TIBC, IRON, RETICCTPCT in the last 72 hours. Urinalysis    Component Value Date/Time   COLORURINE AMBER (A) 12/07/2017 2208   APPEARANCEUR CLEAR 12/07/2017 2208   LABSPEC 1.019 12/07/2017 2208   PHURINE 5.0 12/07/2017 2208   GLUCOSEU NEGATIVE  12/07/2017 2208   HGBUR SMALL (A) 12/07/2017 2208   BILIRUBINUR NEGATIVE 12/07/2017 2208   [place] 12/07/2017 2208   PROTEINUR 100 (A) 12/07/2017 2208   NITRITE NEGATIVE 12/07/2017 2208   LEUKOCYTESUR NEGATIVE 12/07/2017 2208   Sepsis Labs Invalid input(s): PROCALCITONIN,  WBC,  LACTICIDVEN Microbiology No results found for this or any previous visit (from the past 240 hour(s)).   Time coordinating discharge: 40 minutes       SIGNED:   Edwin Dada, MD  [facility] 12/13/2017, 11:11 AM

## 2017-12-13 NOTE — Care Management Note (Signed)
Case Management Note  Patient Details  Name: Takeisha Cianci MRN: [id number] Date of Birth: 23-Sep-1955  Subjective/Objective:                    Action/Plan: Pt discharging to [facility]. CM signing off.   Expected Discharge Date:  12/13/17               Expected Discharge Plan:  Skilled Nursing Facility  In-House Referral:  Clinical Social Work  Discharge planning Services     Post Acute Care Choice:    Choice offered to:     DME Arranged:    DME Agency:     HH Arranged:    [place] Agency:     Status of Service:  Completed, signed off  If discussed at [employer], dates discussed:    Additional Comments:  Pollie Friar, RN 12/13/2017, 1:05 PM

## 2017-12-13 NOTE — Progress Notes (Signed)
Physical Therapy Treatment Patient Details Name: Glenda Johnson MRN: [id number] DOB: [birth year] Today's Date: 12/13/2017    History of Present Illness 62 y.o. female admitted on 12/07/17 for confusion.  Pt found to be lying in the floor at home by her sister.  CT of head with no acute events, MRI is negative for acute events.  Pt found to be in A-fib with RVR.  MRI whoed remote Rt occipital inarct, but no acute changes.  Dx:  Likely seizure due to abnormal EEG.   Pt with other significant PMH of recent stroke (March), HTN, Hepatitis C, COPD, CHF, A-fib, and back surgery.      PT Comments    Session limited d/t pt agitated on arrival, demanding that  the OT needed to come back because the she "was making a phone call for me" --regarding discharge; pt reporting all the rude names she called the SW; eventually able to calm pt; pt agreeable only to minimal activity; she does continue to be amenable to rehab at SNF (pe rpt she used to work at [employer]) and motivated to go to [place] to see her daughter and grandson; continue PT POC  Follow Up Recommendations  SNF     Equipment Recommendations  None recommended by PT    Recommendations for Other Services       Precautions / Restrictions Precautions Precautions: Fall Restrictions Weight Bearing Restrictions: No    Mobility  Bed Mobility Overal bed mobility: Modified Independent Bed Mobility: Sit to Supine     Supine to sit: Supervision Sit to supine: Modified independent (Device/Increase time)   General bed mobility comments: increased time, no physical assist  Transfers Overall transfer level: Needs assistance Equipment used: None Transfers: Sit to/from Stand Sit to Stand: Supervision Stand pivot transfers: Min guard       General transfer comment: pt refused (partial sit to stand performed while pt was repsitioning prior to return to supine--supervision for safety  Ambulation/Gait             General Gait  Details: pt refused despite attempts to encourage and coax into activity/amb   Stairs             Wheelchair Mobility    Modified Rankin (Stroke Patients Only)       Balance Overall balance assessment: Needs assistance Sitting-balance support: Feet supported Sitting balance-Leahy Scale: Fair Sitting balance - Comments: supervision seated EOB                                    Cognition Arousal/Alertness: Awake/alert Behavior During Therapy: Anxious;Flat affect Overall Cognitive Status: No family/caregiver present to determine baseline cognitive functioning Area of Impairment: Safety/judgement;Awareness;Problem solving                   Current Attention Level: Sustained   Following Commands: Follows one step commands consistently;Follows one step commands with increased time Safety/Judgement: Decreased awareness of safety   Problem Solving: Requires verbal cues General Comments: easily agitated. Benefits from reduced distractions, one step commands. pt  insisting MD or PT track down OT because "she was making a call for her about her discharge"; able to redirect and calm pt;       Exercises General Exercises - Lower Extremity Ankle Circles/Pumps: Seated;AROM;Strengthening;Both;10 reps    General Comments        Pertinent Vitals/Pain Pain Assessment: No/denies pain    Home Living  Prior Function            PT Goals (current goals can now be found in the care plan section) Acute Rehab PT Goals Patient Stated Goal: to feel better PT Goal Formulation: Patient unable to participate in goal setting Time For Goal Achievement: 12/22/17 Potential to Achieve Goals: Fair Progress towards PT goals: Progressing toward goals(slowly)    Frequency    Min 3X/week      PT Plan Current plan remains appropriate    Co-evaluation              AM-PAC PT "6 Clicks" Daily Activity  Outcome Measure   Difficulty turning over in bed (including adjusting bedclothes, sheets and blankets)?: None Difficulty moving from lying on back to sitting on the side of the bed? : A Little Difficulty sitting down on and standing up from a chair with arms (e.g., wheelchair, bedside commode, etc,.)?: A Little Help needed moving to and from a bed to chair (including a wheelchair)?: A Little Help needed walking in hospital room?: A Little Help needed climbing 3-5 steps with a railing? : A Little 6 Click Score: 19    End of Session   Activity Tolerance: Other (comment)(limited d/t distractions regardign d/c plan) Patient left: in bed;with call bell/phone within reach;with bed alarm set   PT Visit Diagnosis: Difficulty in walking, not elsewhere classified (R26.2);Other symptoms and signs involving the nervous system (R29.898);History of falling (Z91.81)     Time: 3291-9166 PT Time Calculation (min) (ACUTE ONLY): 21 min  Charges:  $Self Care/Home Management: 8-22                    G Codes:       Kenyon Ana, PT Pager: [phone] 12/13/2017    [facility] [address] 12/13/2017, 11:35 AM

## 2017-12-25 ENCOUNTER — Ambulatory Visit (INDEPENDENT_AMBULATORY_CARE_PROVIDER_SITE_OTHER): Payer: BLUE CROSS/BLUE SHIELD | Admitting: Adult Health

## 2017-12-25 ENCOUNTER — Ambulatory Visit: Payer: Self-pay | Admitting: Neurology

## 2017-12-25 ENCOUNTER — Encounter: Payer: Self-pay | Admitting: Adult Health

## 2017-12-25 ENCOUNTER — Ambulatory Visit: Payer: Self-pay | Admitting: Adult Health

## 2017-12-25 ENCOUNTER — Telehealth: Payer: Self-pay | Admitting: Adult Health

## 2017-12-25 VITALS — BP 142/65 | HR 68 | Ht 67.0 in | Wt 110.2 lb

## 2017-12-25 DIAGNOSIS — F0391 Unspecified dementia with behavioral disturbance: Secondary | ICD-10-CM

## 2017-12-25 DIAGNOSIS — R569 Unspecified convulsions: Secondary | ICD-10-CM

## 2017-12-25 DIAGNOSIS — I6340 Cerebral infarction due to embolism of unspecified cerebral artery: Secondary | ICD-10-CM

## 2017-12-25 DIAGNOSIS — I10 Essential (primary) hypertension: Secondary | ICD-10-CM

## 2017-12-25 DIAGNOSIS — F03918 Unspecified dementia, unspecified severity, with other behavioral disturbance: Secondary | ICD-10-CM

## 2017-12-25 DIAGNOSIS — E785 Hyperlipidemia, unspecified: Secondary | ICD-10-CM

## 2017-12-25 NOTE — Telephone Encounter (Signed)
Called and left [place] a message stating . Referral for Ophthalmology sent to Dr. Katy Fitch telephone [phone]- fax [phone].   Referral for Neuropsychology sent to Dr. Lane Hacker in [place]  She has sooner apt's Telephone [phone] - fax [phone].   If she calls please give her this information.

## 2017-12-25 NOTE — Patient Instructions (Addendum)
Continue Eliquis (apixaban) daily for secondary stroke prevention  Continue keppra for seizure prevention  You will be called to schedule appointment with eye doctor - neuro - ophthalmologist   You will be called for formal memory testing   Continue to follow up with PCP regarding hypertension and cholesterol management   Continue to monitor blood pressure at home  Maintain strict control of hypertension with blood pressure goal below 130/90, diabetes with hemoglobin A1c goal below 6.5% and cholesterol with LDL cholesterol (bad cholesterol) goal below 70 mg/dL. I also advised the patient to eat a healthy diet with plenty of whole grains, cereals, fruits and vegetables, exercise regularly and maintain ideal body weight.  Followup in the future with me in 6 months or call earlier if needed        Thank you for coming to see [place] at [facility]. I hope we have been able to provide you high quality care today.  You may receive a patient satisfaction survey over the next few weeks. We would appreciate your feedback and comments so that we may continue to improve ourselves and the health of our patients.

## 2017-12-25 NOTE — Progress Notes (Signed)
[facility] [address] [place] [address] [phone]       OFFICE FOLLOW UP NOTE  [name] Date of Birth:  06-Mar-1956 Medical Record Number:  [id number]   Reason for Referral:  hospital stroke follow up  CHIEF COMPLAINT:  Chief Complaint  Patient presents with  . Follow-up    Hospital Stroke follow up room 9 pt at [facility] pt with sister [name] is at [facility] facility    HPI: Glenda Johnson is being seen today for initial visit in the office for multiple territory bilateral infarcts on 09/19/17. History obtained from patient and chart review. Reviewed all radiology images and labs personally.  [name] is a 62 y.o. female with history of COPD, atrial fibrillation (non compliant with anticoagulation), HTN, Hep C, and diastolic heart failure who was admitted on 09/15/17 with acute respiratory failure due to pulmonary edema and hypertensive emergency. She was admitted to ICU on bipap and treated w/ IV diuretics. LV mass found - myxoma vs thrombus. In hospital she developed B blurry vision. MRI showed multiple acute B infarcts including left cerebellum, bilateral PCA/MCA, right MCA, bilateral MCA/ACA and right ACA punctate infarcts.  Repeat CT scan showed right occipital junction infarct and multiple bilateral infarcts.  Carotid Doppler showed bilateral ICA stenosis of 1 to 39% based anterograde.  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  Lower extremity Doppler negative.  LDL satisfactory at 43 and A1c satisfactory at 5.1.  Patient was on aspirin 81 mg PTA and recommended to continue aspirin 81 mg along with warfarin due to LV mass and history of atrial fibrillation.  Patient discharged to SNF for continued therapies. Repeat TTE on 12/01/2017 which showed resolution of thrombus a large intraventricular mass seen on previous echo.  Recent hospital admission on 12/07/2017 after being found on the floor covered in feces by  her sister.  Patient was found to be in atrial fibrillation with RVR on admission subsequently leading to Cardizem drip and was admitted for syncope work-up, CHF flare in A. fib with RVR.  CT head and MRI brain were unremarkable except for encephalomalacia from recent stroke.  EEG obtained and noted to have proximal, bifrontal temporal buildup of fast activity though suspected to represent a propensity for seizures and therefore Keppra was started.  Also concerns in the admission for increasing memory impairment such as missing medication refills or being unable to prepare meals or care for her cats.  It was requested to do outpatient neurocognitive testing.  Patient was discharged to SNF facility.  Patient is being seen today for hospital follow-up and is accompanied by her sister.  She does continue to have bilateral blurry vision complaints but this has resolved some.  She states that she can still see objects but they are not as sharp and clear.  She also has had 2 separate episodes of feeling as though her ears are plugged after getting upset that last approximately 1 to 2 minutes.  She is currently participating in PT/OT but PT will shortly be stopping as she is completed therapy for this.  She continues to take Keppra without seizure-like symptoms or events.  Continue to take Eliquis with mild bruising but no bleeding.  Blood pressure today 142/65.  MMSE performed at today's appointment for concerns of memory loss from hospital admission.  Sister concerned about recent behavioral issues especially at current facility.  She states over the weekend, patient told everybody there that she  was running the place and trying to boss everybody around but also becoming combative.  Patient does have advanced education level and as MMSE 25/30, it is recommended to undergo formal neuropsychology testing.  Patient denies new or worsening stroke/TIA symptoms.   ROS:   14 system review of systems performed and negative  with exception of fatigue, hearing loss, itching, incontinence, loss of vision, easy bruising, weakness, depression and anxiety  PMH:  Past Medical History:  Diagnosis Date  . Atrial fibrillation ([place])   . CHF (congestive heart failure) ([place])   . COPD (chronic obstructive pulmonary disease) ([place])   . Hepatitis C   . Pulmonary thrombosis ([place])   . Renal artery stenosis ([place])   . Secondary hypertension   . Stroke ([place])   . Tobacco abuse     PSH:  Past Surgical History:  Procedure Laterality Date  . BACK SURGERY    . CARDIAC CATHETERIZATION N/A 05/25/2016   Procedure: Left Heart Cath and Coronary Angiography;  Surgeon: Lorretta Harp, MD;  Location: [place] [facility];  Service: Cardiovascular;  Laterality: N/A;  . PLACEMENT OF BREAST IMPLANTS Bilateral   . stent renal artery      Social History:  Social History   Socioeconomic History  . Marital status: Divorced    Spouse name: Not on file  . Number of children: Not on file  . Years of education: Not on file  . Highest education level: Not on file  Occupational History  . Not on file  Social Needs  . Financial resource strain: Not on file  . Food insecurity:    Worry: Not on file    Inability: Not on file  . Transportation needs:    Medical: Not on file    Non-medical: Not on file  Tobacco Use  . Smoking status: Former Smoker    Types: Cigarettes    Last attempt to quit: 12/03/2017    Years since quitting: 0.0  . Smokeless tobacco: Never Used  Substance and Sexual Activity  . Alcohol use: No  . Drug use: No  . Sexual activity: Not Currently    Birth control/protection: None  Lifestyle  . Physical activity:    Days per week: Not on file    Minutes per session: Not on file  . Stress: Not on file  Relationships  . Social connections:    Talks on phone: Not on file    Gets together: Not on file    Attends religious service: Not on file    Active member of club or organization: Not on file    Attends  meetings of clubs or organizations: Not on file    Relationship status: Not on file  . Intimate partner violence:    Fear of current or ex partner: Patient refused    Emotionally abused: Patient refused    Physically abused: Patient refused    Forced sexual activity: Patient refused  Other Topics Concern  . Not on file  Social History Narrative  . Not on file    Family History:  Family History  Problem Relation Age of Onset  . Diabetes Maternal Grandmother   . Stroke Mother     Medications:   Current Outpatient Medications on File Prior to Visit  Medication Sig Dispense Refill  . albuterol (PROVENTIL HFA;VENTOLIN HFA) 108 (90 Base) MCG/ACT inhaler Inhale 2 puffs into the lungs every 4 (four) hours as needed.    [name] albuterol (PROVENTIL) (2.5 MG/3ML) 0.083% nebulizer solution Take 3  mLs (2.5 mg total) by nebulization every 6 (six) hours as needed for wheezing. 75 mL 12  . apixaban (ELIQUIS) 5 MG TABS tablet Take 5 mg by mouth 2 (two) times daily.    . carvedilol (COREG) 12.5 MG tablet Take 1 tablet (12.5 mg total) by mouth 2 (two) times daily with a meal.    . Fluticasone-Umeclidin-Vilant (TRELEGY ELLIPTA) 100-62.5-25 MCG/INH AEPB Inhale 1 puff into the lungs daily.     . furosemide (LASIX) 40 MG tablet Take 1 tablet (40 mg total) by mouth 2 (two) times daily. 60 tablet 0  . hydrALAZINE (APRESOLINE) 50 MG tablet Take 1 tablet (50 mg total) by mouth every 8 (eight) hours. 90 tablet 3  . levETIRAcetam (KEPPRA) 500 MG tablet Take 1 tablet (500 mg total) by mouth 2 (two) times daily. 60 tablet 3  . LORazepam (ATIVAN PO) Take 0.5 mg by mouth every 6 (six) hours.    . nitroGLYCERIN (NITROSTAT) 0.4 MG SL tablet Place 0.4 mg under the tongue every 5 (five) minutes as needed for chest pain.    . pantoprazole (PROTONIX) 40 MG tablet Take 1 tablet (40 mg total) by mouth daily at 12 noon.    . potassium chloride SA (K-DUR,KLOR-CON) 20 MEQ tablet Take 2 tablets (40 mEq total) by mouth daily.  (Patient taking differently: Take 20 mEq by mouth 2 (two) times daily. ) 60 tablet 0   No current facility-administered medications on file prior to visit.     Allergies:   Allergies  Allergen Reactions  . Codeine Anaphylaxis  . Tramadol Anaphylaxis    No hydrocodone or anything connected No hydrocodone or anything connected   . Clarithromycin Nausea And Vomiting  . Citalopram Other (See Comments)    Caused more depression   . Fish Allergy Itching    ears  . Other     Patient says that all narcotics give her anaphylaxis.   . Wellbutrin [Bupropion] Other (See Comments)    "could not speak a full sentence for a whole year"     Physical Exam  Vitals:   12/25/17 1449  BP: (!) 142/65  Pulse: 68  Weight: 110 lb 3.2 oz (50 kg)  Height: 5\' 7"  (1.702 m)   Body mass index is 17.26 kg/m. No exam data present  General: Cachectic elderly Caucasian female, seated, in no evident distress Head: head normocephalic and atraumatic.   Neck: supple with no carotid or supraclavicular bruits Cardiovascular: irregular rate and rhythm, no murmurs Musculoskeletal: no deformity Skin:  no rash/petichiae Vascular:  Normal pulses all extremities  Neurologic Exam Mental Status: Awake and fully alert. Oriented to place and time. Recent and remote memory intact. Attention span, concentration and fund of knowledge appropriate. Mood and affect appropriate.  No flowsheet data found. Cranial Nerves: Fundoscopic exam reveals sharp disc margins. Pupils equal, briskly reactive to light. Extraocular movements full without nystagmus. Visual fields full to confrontation. Hearing intact. Facial sensation intact. Face, tongue, palate moves normally and symmetrically.  Motor: Normal bulk and tone. Normal strength in all tested extremity muscles. Sensory.: intact to touch , pinprick , position and vibratory sensation.  Coordination: Rapid alternating movements normal in all extremities. Finger-to-nose and  heel-to-shin performed accurately bilaterally. Gait and Station: Arises from chair without difficulty. Stance is normal. Gait demonstrates normal stride length and balance . Able to heel, toe and tandem walk without difficulty.  Reflexes: 1+ and symmetric. Toes downgoing.    NIHSS  0 Modified Rankin  3 HAS-BLED 1 CHA2DS2-VASc  5   Diagnostic Data (Labs, Imaging, Testing)  MR BRAIN WO CONTRAST 09/19/17 IMPRESSION: Multiple acute infarcts bilaterally consistent with cerebral emboli. No midline shift or hemorrhage.  CT HEAD WO CONTRAST 09/20/2017 IMPRESSION: 1. Right occipitotemporal junction infarction as well as multiple additional small infarctions throughout the cerebral hemispheres and left cerebellum are stable in comparison with the prior MRI given differences in technique. No associated hemorrhage or mass effect. 2. No new acute intracranial abnormality identified.  CT CHEST WO CONTRAST 09/20/2017 IMPRESSION: 1. Multifocal opacities in the lung bases bilaterally which have an unusual appearance. These are favored to reflect potential areas of cryptogenic organizing pneumonia (COP) based on the appearance of the largest lesion in the posterior left lower lobe which has a suggestion of the so-called "atoll sign". However, the overall appearance is nonspecific. Differential considerations include atypical infectious etiologies, areas of alveolar hemorrhage in the setting of multifocal pulmonary infarction, and even neoplasm such as adenocarcinoma. At this time, repeat noncontrast chest CT is suggested in 3-6 weeks to re-evaluate these findings. If there is any clinical concern for acute pulmonary embolus at this time, further evaluation with PE protocol CT would be recommended. 2. Small right and trace left pleural effusions lying dependently. 3. Small amount of pericardial fluid and/or thickening, unlikely to be of hemodynamic significance at this time. 4. Mild diffuse  bronchial wall thickening and mild to moderate centrilobular and paraseptal emphysema; imaging findings compatible with the clinical history of COPD. 5. Aortic atherosclerosis.  ECHOCARDIOGRAM 09/16/2017 Study Conclusions - Left ventricle: The cavity size was normal. There was mild   concentric hypertrophy. Systolic function was normal. The   estimated ejection fraction was in the range of 55% to 60%. Wall   motion was normal; there were no regional wall motion   abnormalities. Features are consistent with a pseudonormal left   ventricular filling pattern, with concomitant abnormal relaxation   and increased filling pressure (grade 2 diastolic dysfunction).   There was a large, 2.5 cm (L) x 1.8 cm (W), pedunculated,   heterogeneous mobile mass. It has the appearance of a myxoma, but   the location is atypical. Apical wall motion is normal in the   surrounding myocardium, which makes a thrombus unlikely. - Left atrium: The atrium was mildly dilated. - Pulmonary arteries: Systolic pressure was mildly increased. PA   peak pressure: 44 mm Hg (S). - Pericardium, extracardiac: A trivial pericardial effusion was   identified.  ECHOCARDIOGRAM (REPEAT) 12/01/2017 Study Conclusions - Left ventricle: The cavity size was normal. There was mild   concentric hypertrophy. Systolic function was at the lower limits   of normal. The estimated ejection fraction was in the range of   50% to 55%. Mild diffuse hypokinesis with no identifiable   regional variations. Features are consistent with a pseudonormal   left ventricular filling pattern, with concomitant abnormal   relaxation and increased filling pressure (grade 2 diastolic   dysfunction). Doppler parameters are consistent with elevated   mean left atrial filling pressure. Acoustic contrast   opacification revealed no evidence ofthrombus. - Left atrium: The atrium was mildly dilated. - Right ventricle: The cavity size was mildly dilated.  Systolic   function was moderately reduced. - Right atrium: The atrium was mildly dilated. - Atrial septum: No defect or patent foramen ovale was identified. - Pulmonary arteries: Systolic pressure was moderately increased.   PA peak pressure: 56 mm Hg (S). Impressions: - The large intraventricular mass seen on the previous echo is no  longer present, consistent with resolution of thrombus.      ASSESSMENT: Bryan Omura is a 62 y.o. year old female here with multiple territory bilateral infarcts on 09/19/2017 secondary to embolic due to cardiac source including A. fib versus cardiac LV mass. Vascular risk factors include COPD, atrial fibrillation, HTN.    PLAN: -Continue Eliquis (apixaban) daily for secondary stroke prevention -F/u with PCP regarding your HTN management -f/u with cardiologist regarding atrial fibrillation and Eliquis management -Referral placed for neuro-ophthalmology due to continued vision loss -referral to neuro psych for cognitive testing for possible dementia with behavioral issues -continue to monitor BP at home -Continue PT/OT at current facility -Maintain strict control of hypertension with blood pressure goal below 130/90, diabetes with hemoglobin A1c goal below 6.5% and cholesterol with LDL cholesterol (bad cholesterol) goal below 70 mg/dL. I also advised the patient to eat a healthy diet with plenty of whole grains, cereals, fruits and vegetables, exercise regularly and maintain ideal body weight.  Follow up in 6 months or call earlier if needed  Greater than 50% of time during this 25 minute visit was spent on counseling,explanation of diagnosis of multiple territory bilateral infarcts, reviewing risk factor management of COPD, atrial fibrillation and HTN, planning of further management, discussion with patient and family and coordination of care  Venancio Poisson, [facility]  [facility] [address] [place], [place]  [address]  Phone [phone] Fax [phone]

## 2017-12-31 NOTE — Progress Notes (Signed)
I agree with the above plan 

## 2018-02-15 ENCOUNTER — Telehealth: Payer: Self-pay | Admitting: Adult Health

## 2018-02-15 NOTE — Telephone Encounter (Signed)
Received office visit fax from [place].A.. Patient was seen on 01/16/18. Per notes: Assessment:  1. 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 Homonymous bilateral field defects, left side 3. Cerebral infarction due to embolism of unspecified cerebral artery  Plan: 1. Dilated today 2. Cataract, OU -observe for now without intervention -a refraction was performed today and MRx dispensed 3. 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 Warden Fillers

## 2018-02-18 ENCOUNTER — Ambulatory Visit: Payer: Managed Care, Other (non HMO) | Admitting: Cardiovascular Disease

## 2018-04-03 ENCOUNTER — Ambulatory Visit: Payer: BLUE CROSS/BLUE SHIELD | Admitting: Adult Health

## 2018-07-27 DEATH — deceased

## 2018-10-01 IMAGING — DX DG CHEST 2V
2 series · 2 of 2 positions shown · non-contrast
Comparison: 09/16/2017

CLINICAL DATA: Short of breath

EXAM:
CHEST - 2 VIEW

[chest pa]
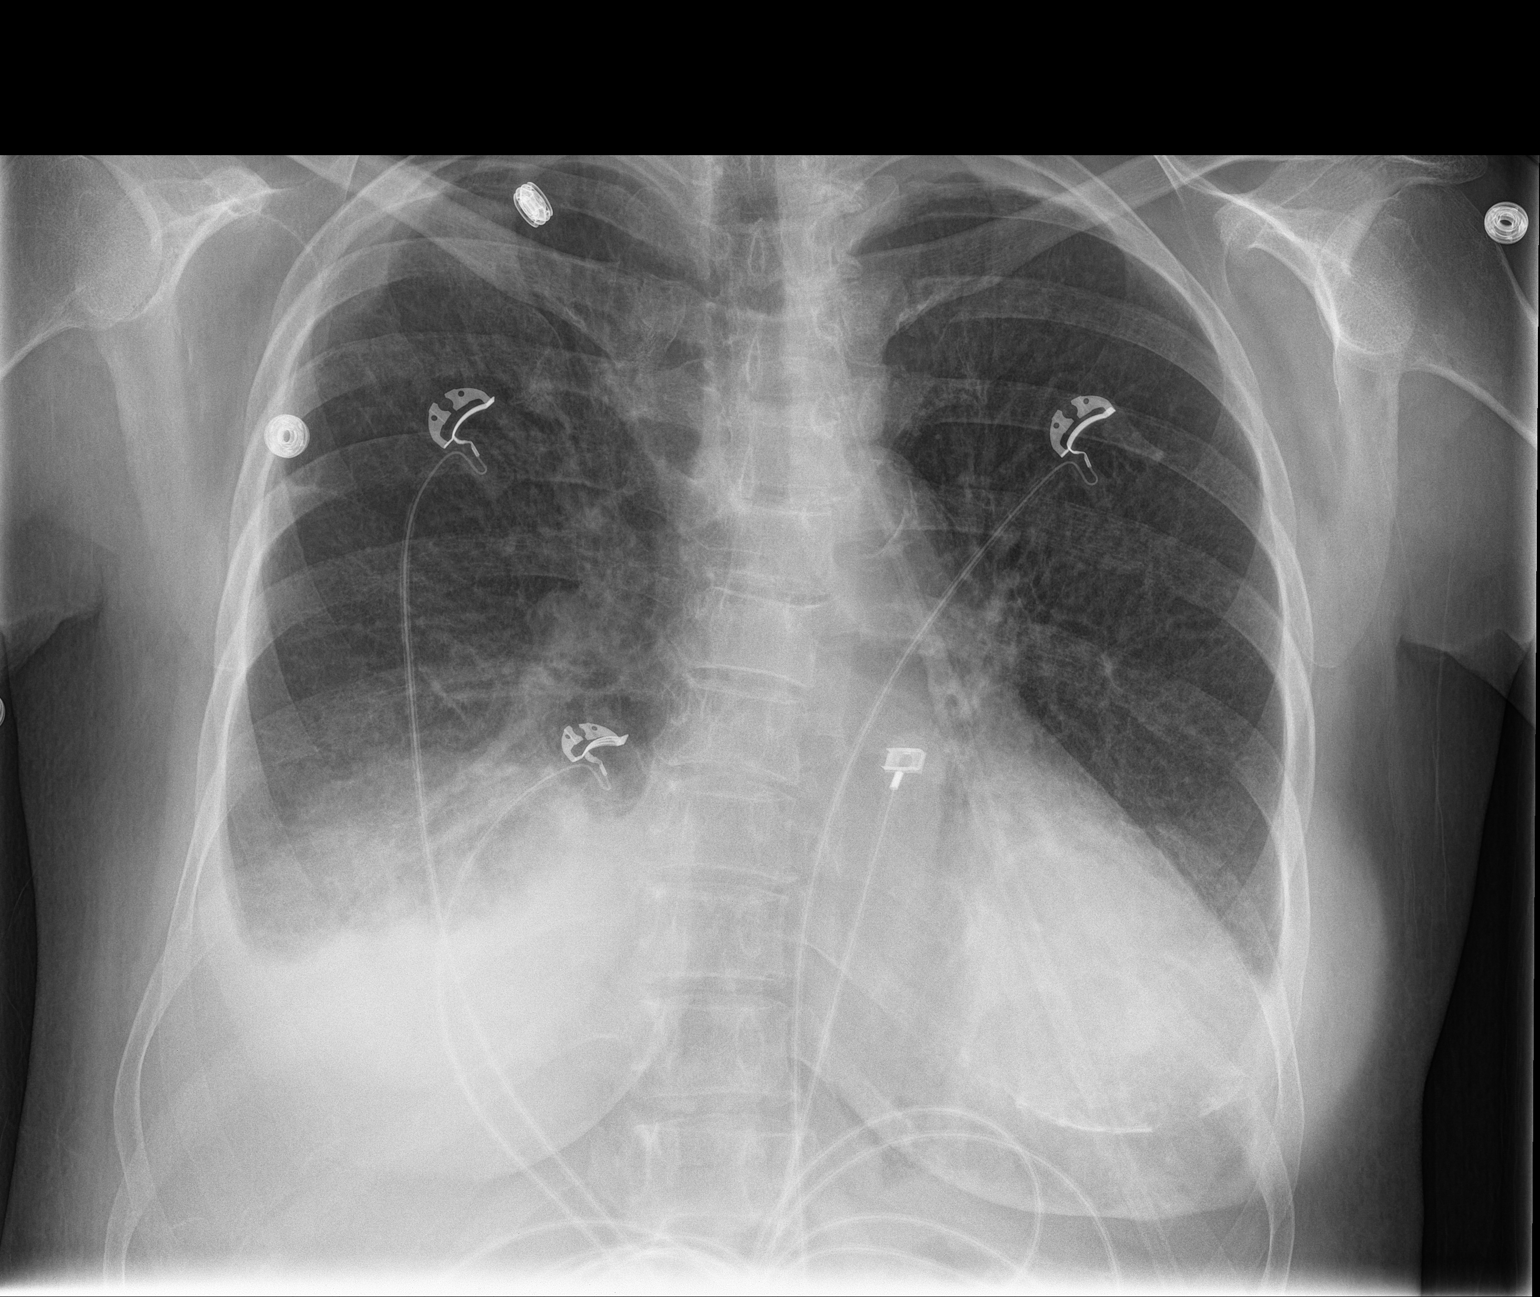

[chest lat]
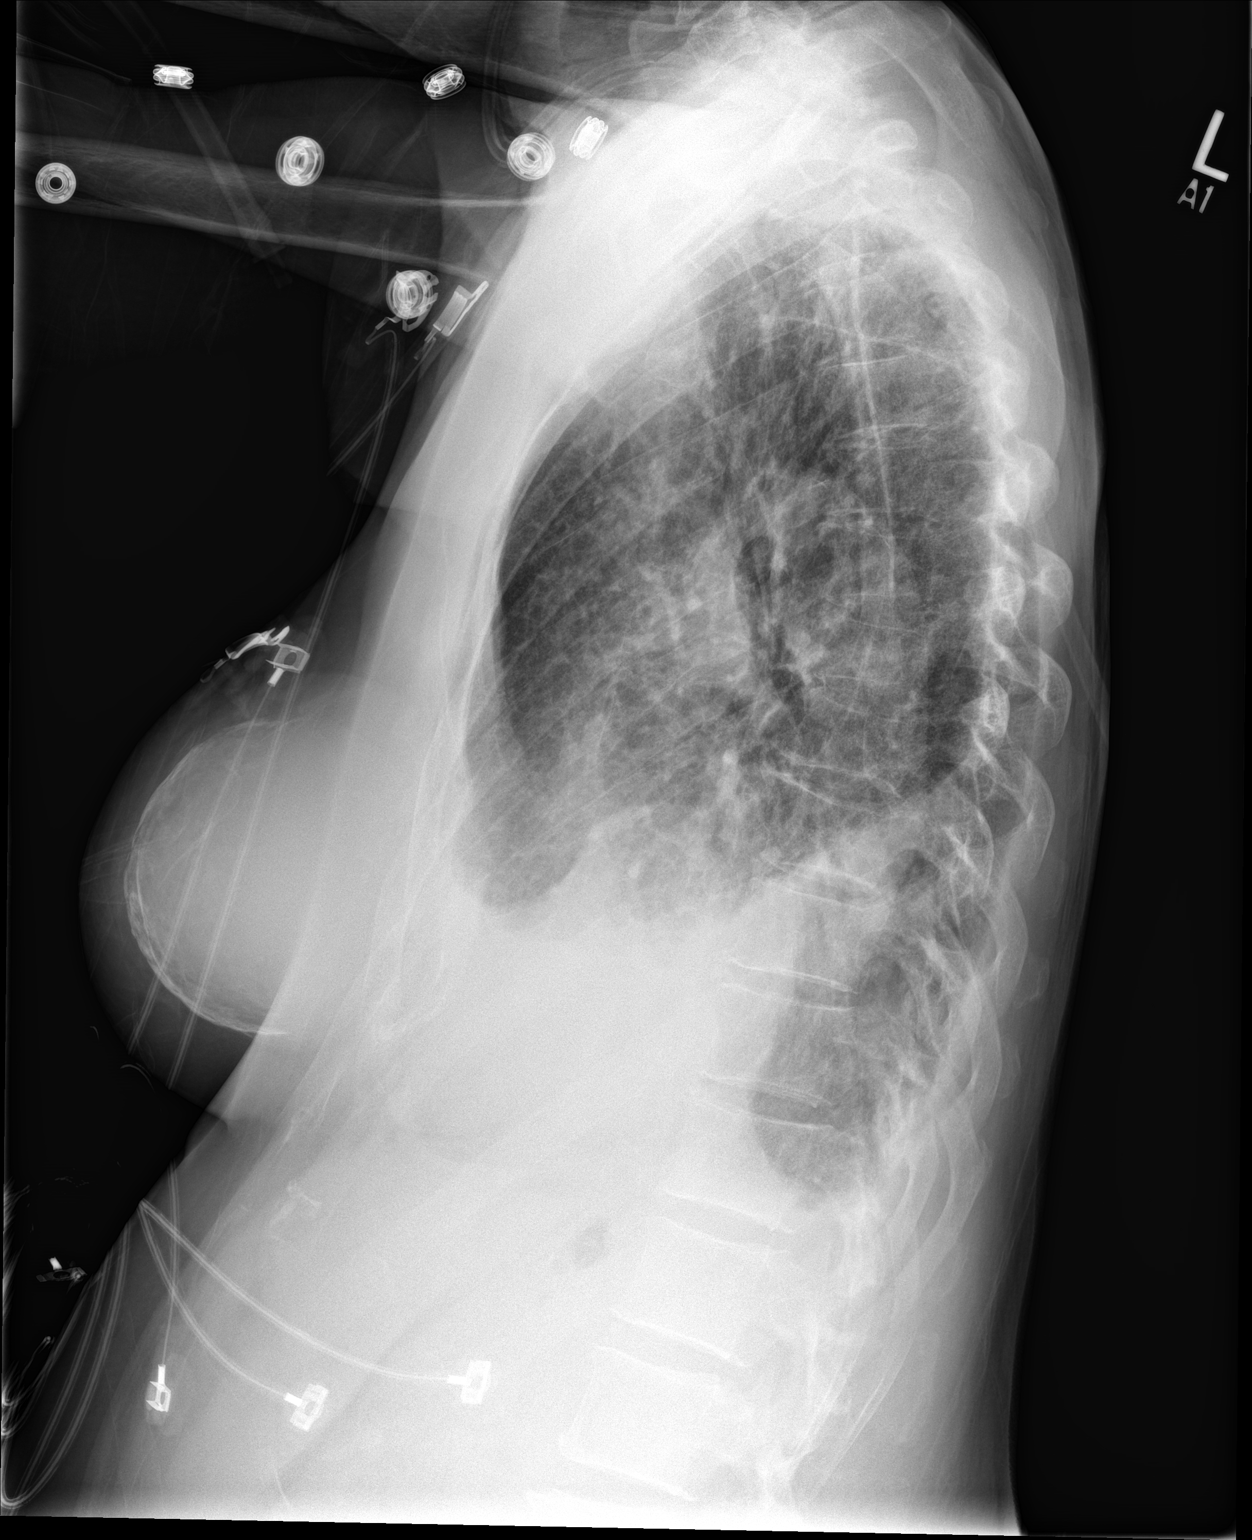

[2 of 2 positions shown; findings below may reference images not displayed]

FINDINGS: Extensive bibasilar airspace disease with mild interval improvement.
Bilateral pleural effusions also have improved.

Underlying chronic lung disease.
IMPRESSION: Interval improvement in bibasilar infiltrate and effusion.

## 2018-10-02 IMAGING — CR DG CHEST 1V PORT
1 series · 1 of 1 positions shown · non-contrast
Comparison: 10/09/2017.  09/16/2017.

CLINICAL DATA: Acute shortness of breath.

EXAM:
PORTABLE CHEST 1 VIEW

[portable]
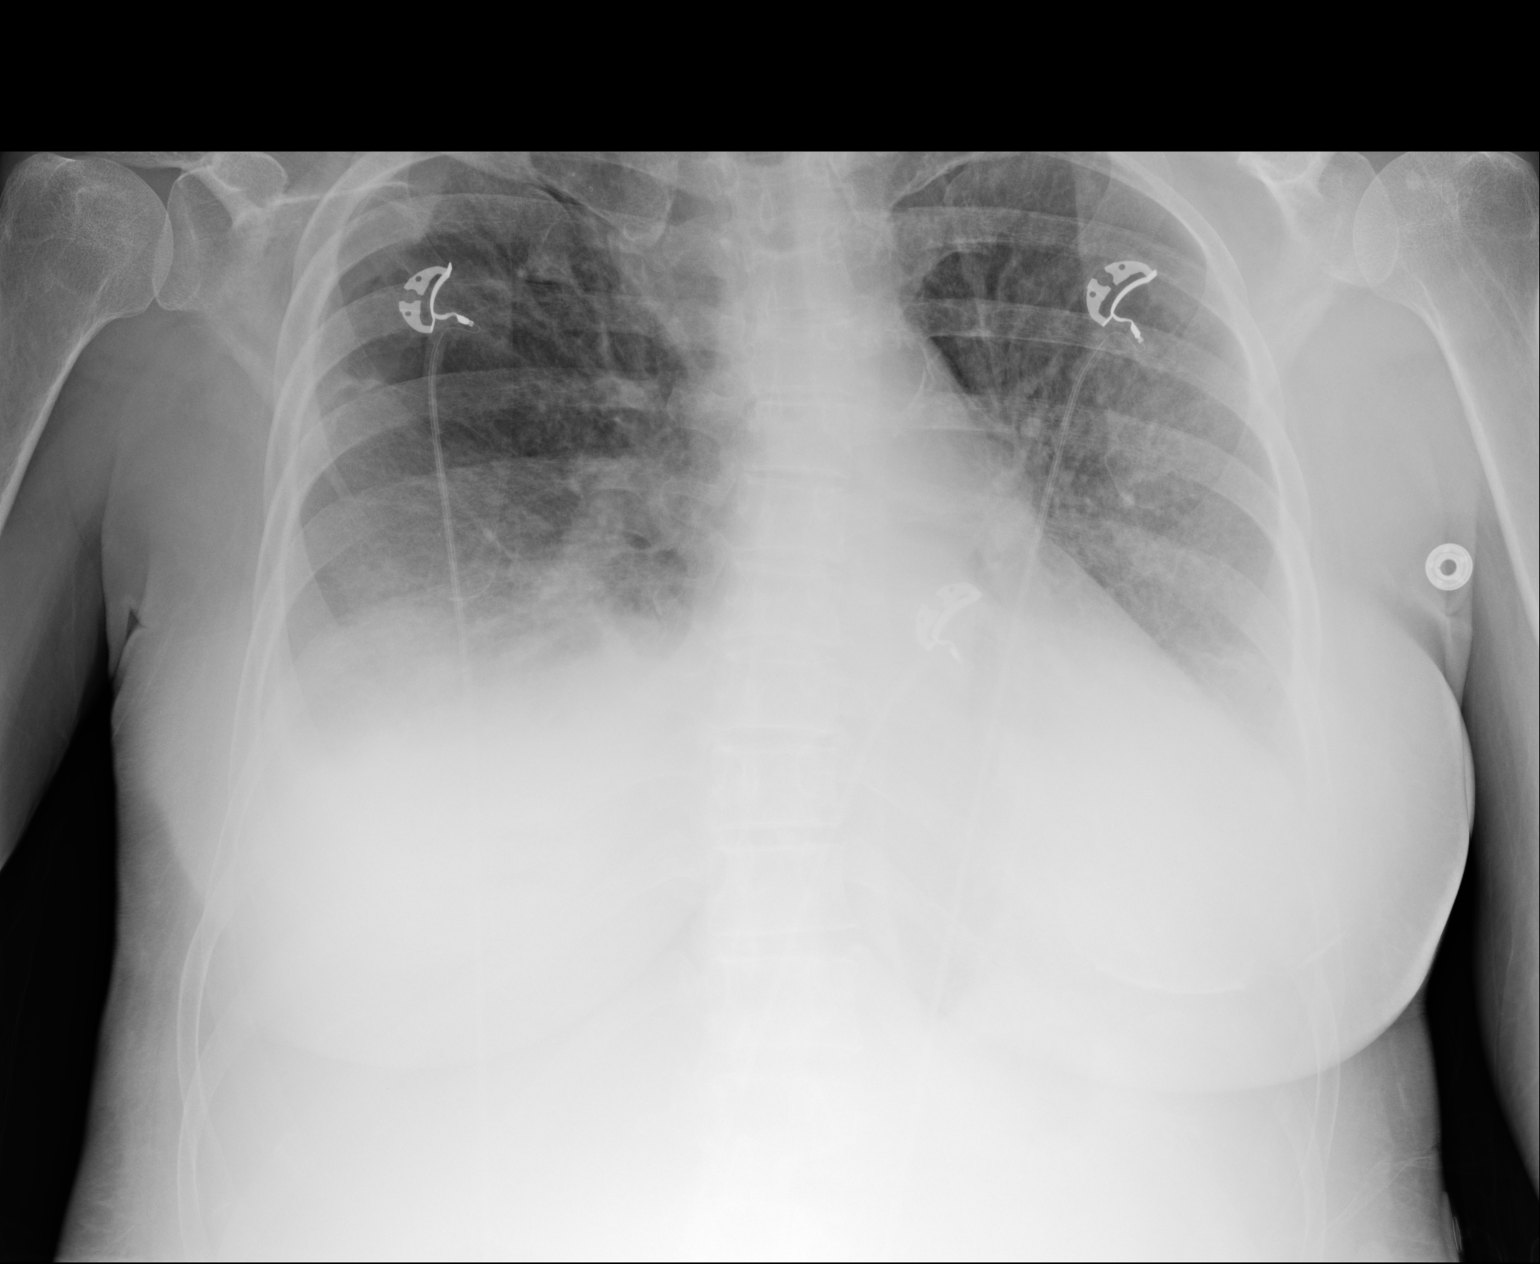

[1 of 1 positions shown; findings below may reference images not displayed]

FINDINGS: Allowing for different technical factors, the appearance is similar
with persistent bilateral effusions and lower lobe atelectasis and
or pneumonia. Upper lungs remain largely clear. Probable
interstitial edema.
IMPRESSION: Similar appearance allowing for different technical factors.
Bilateral effusions with lower lobe atelectasis/infiltrate and
interstitial edema.

## 2018-10-24 IMAGING — DX DG CHEST 1V
1 series · 1 of 1 positions shown · non-contrast
Comparison: Earlier study of 11/01/2017

CLINICAL DATA: RIGHT pleural effusion post thoracentesis

EXAM:
CHEST  1 VIEW

[chest ap]
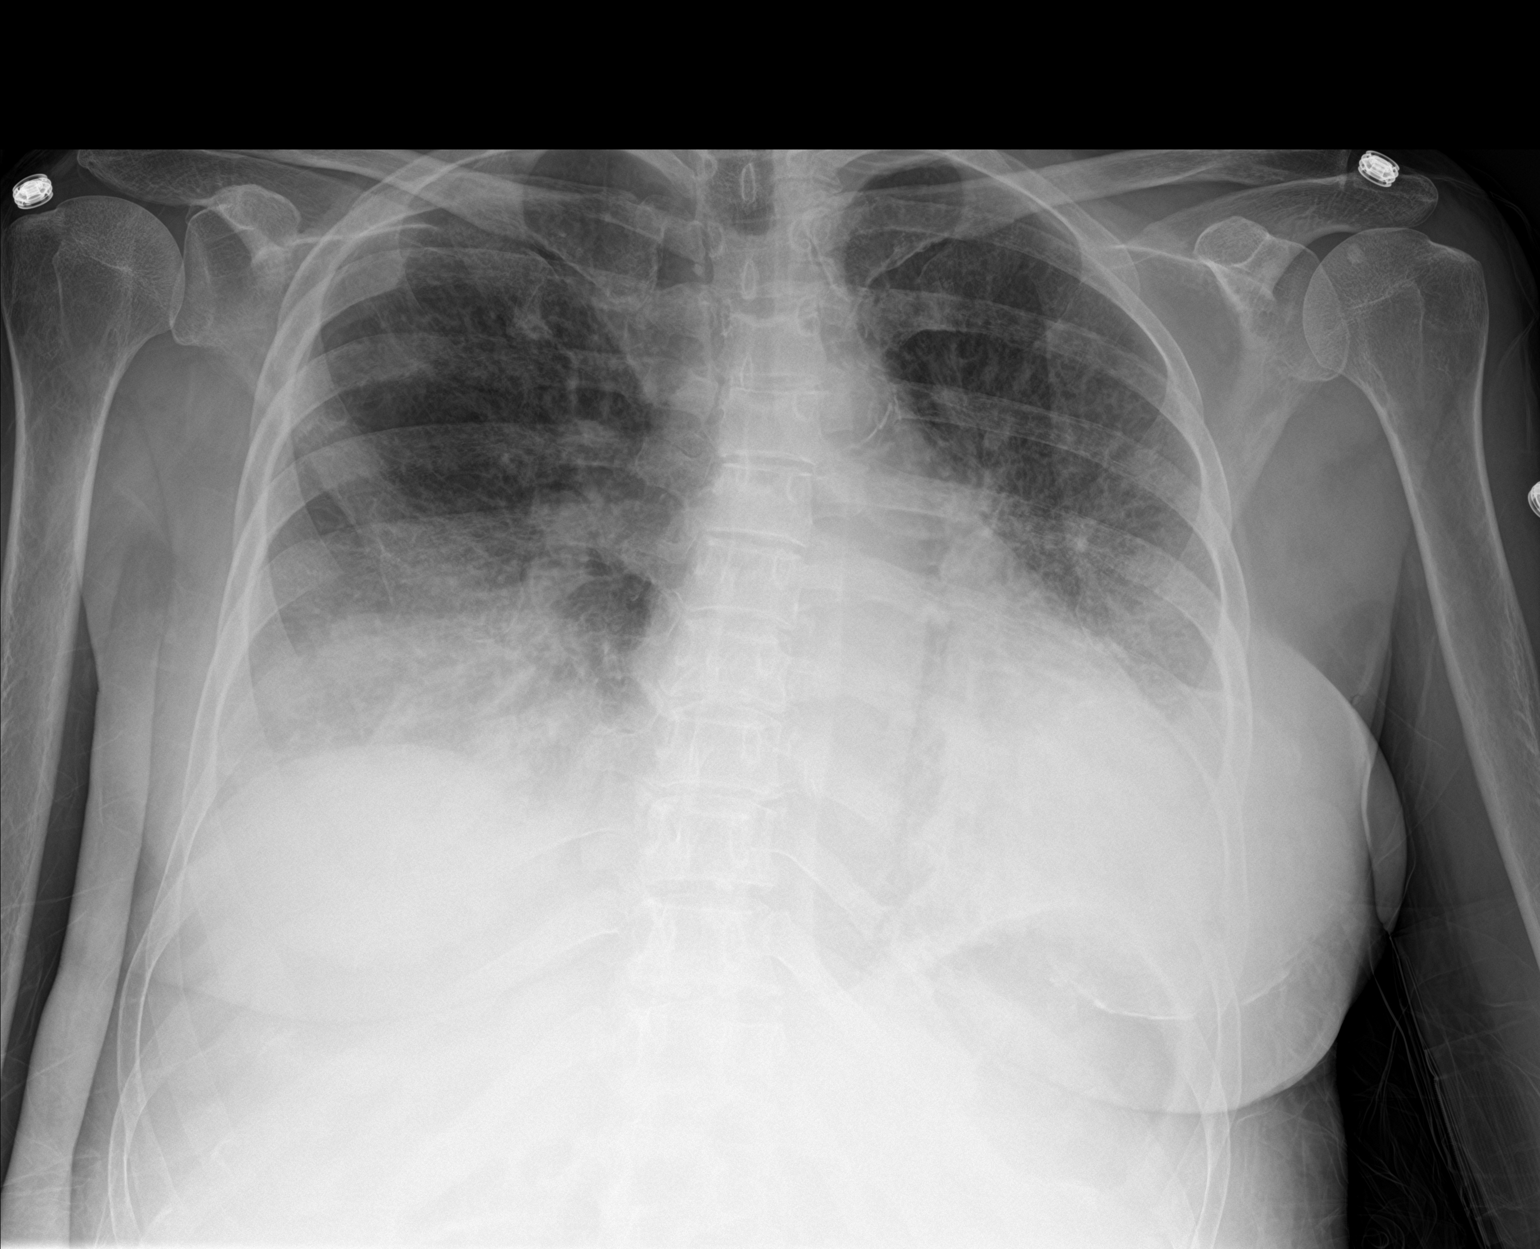

[1 of 1 positions shown; findings below may reference images not displayed]

FINDINGS: Expiratory technique.

Decreased RIGHT pleural effusion post thoracentesis.

No pneumothorax.

Bibasilar atelectasis.

Mild enlargement of cardiac silhouette.

Atherosclerotic calcification aorta.

Upper lungs clear.

BILATERAL breast prostheses with capsular calcification at LEFT
prosthesis.

Bones demineralized.
IMPRESSION: No pneumothorax following RIGHT thoracentesis.

## 2018-10-24 IMAGING — CR DG CHEST 1V PORT
1 series · 1 of 1 positions shown · non-contrast
Comparison: Radiographs October 31, 2017.

CLINICAL DATA: Sepsis.

EXAM:
PORTABLE CHEST 1 VIEW

[portable]
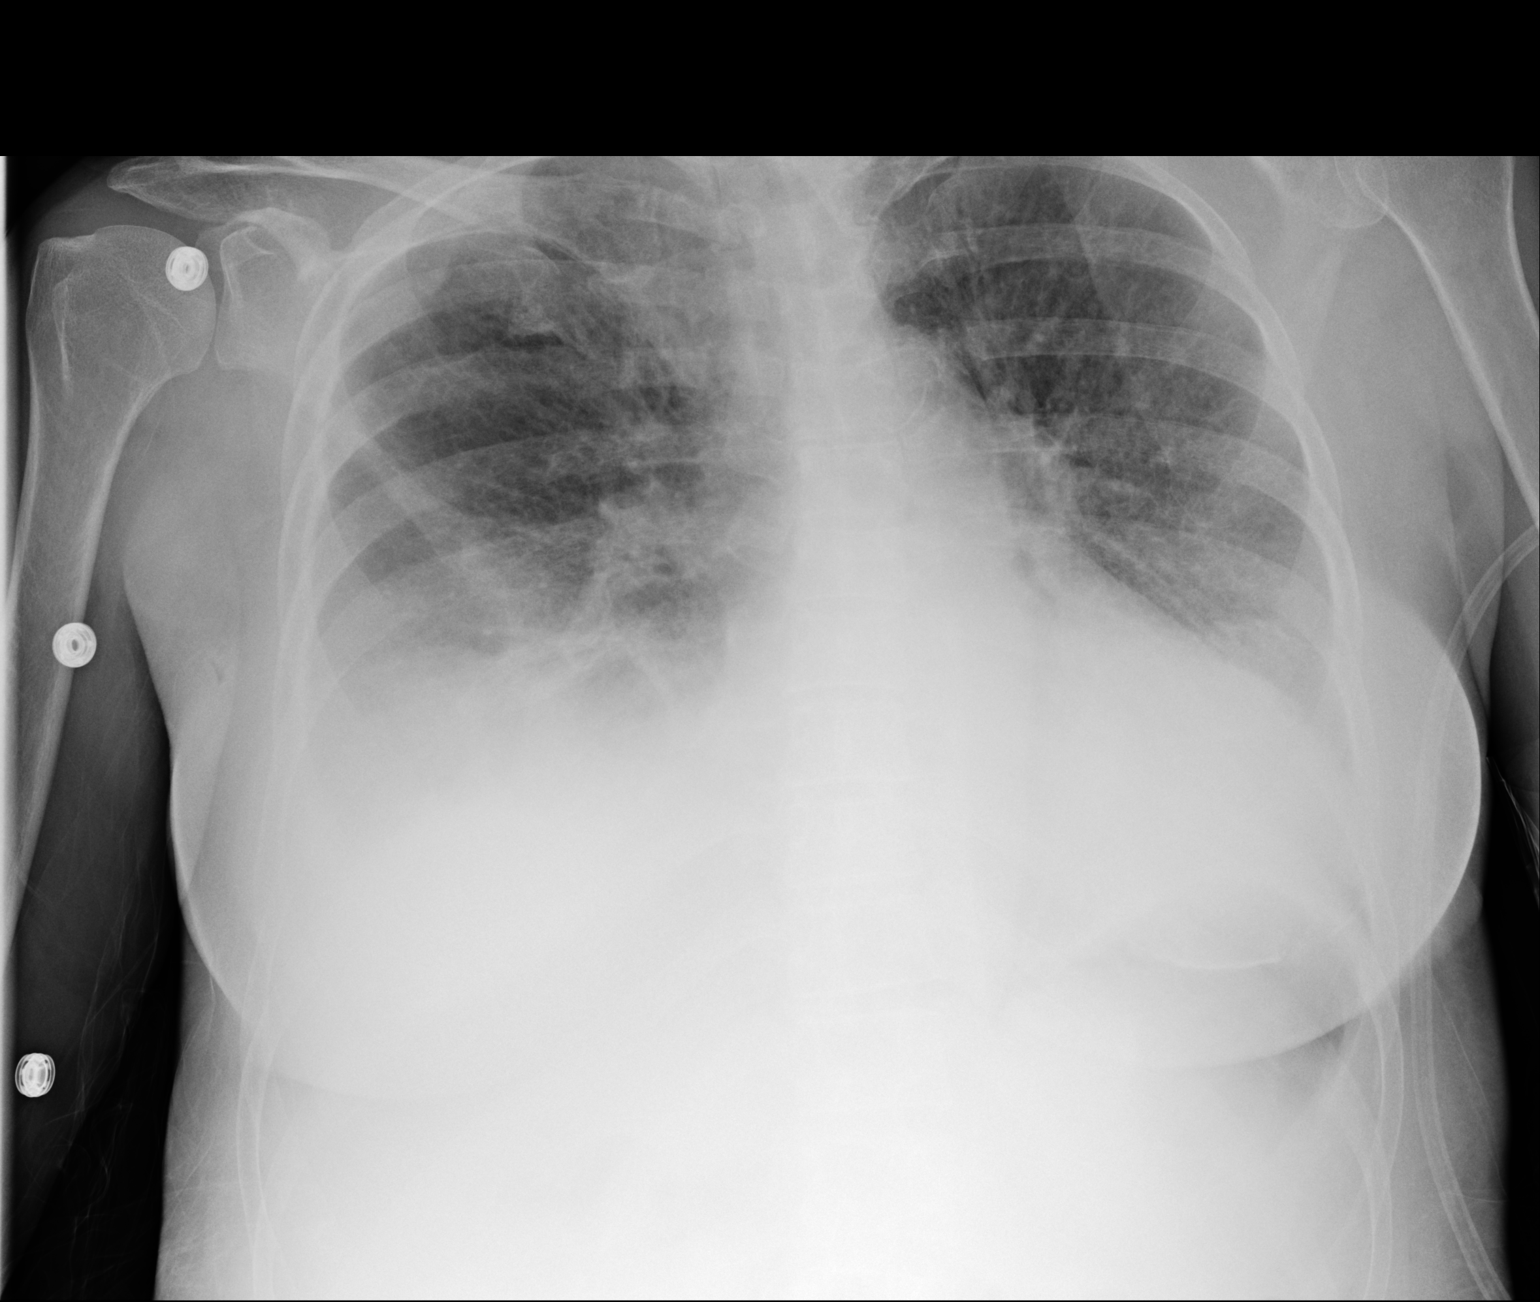

[1 of 1 positions shown; findings below may reference images not displayed]

FINDINGS: Stable cardiomediastinal silhouette. Stable moderate bibasilar edema
or atelectasis is noted with moderate pleural effusions. No
pneumothorax is noted. Bony thorax is unremarkable.
IMPRESSION: Stable bilateral pleural effusions with associated atelectasis or
edema.
